# Patient Record
Sex: Male | Born: 1935 | Race: White | Hispanic: No | Smoking: Former smoker
Health system: Southern US, Community
[De-identification: ages and names within clinical notes are randomized; demographics above are authoritative.]

## PROBLEM LIST (undated history)

## (undated) DIAGNOSIS — E119 Type 2 diabetes mellitus without complications: Secondary | ICD-10-CM

## (undated) DIAGNOSIS — M1990 Unspecified osteoarthritis, unspecified site: Secondary | ICD-10-CM

## (undated) DIAGNOSIS — S72002A Fracture of unspecified part of neck of left femur, initial encounter for closed fracture: Secondary | ICD-10-CM

## (undated) DIAGNOSIS — I10 Essential (primary) hypertension: Secondary | ICD-10-CM

## (undated) DIAGNOSIS — C679 Malignant neoplasm of bladder, unspecified: Secondary | ICD-10-CM

## (undated) DIAGNOSIS — E538 Deficiency of other specified B group vitamins: Secondary | ICD-10-CM

## (undated) DIAGNOSIS — G4730 Sleep apnea, unspecified: Secondary | ICD-10-CM

## (undated) DIAGNOSIS — I499 Cardiac arrhythmia, unspecified: Secondary | ICD-10-CM

## (undated) HISTORY — DX: Cardiac arrhythmia, unspecified: I49.9

## (undated) HISTORY — DX: Essential (primary) hypertension: I10

## (undated) HISTORY — DX: Deficiency of other specified B group vitamins: E53.8

## (undated) HISTORY — DX: Unspecified osteoarthritis, unspecified site: M19.90

## (undated) HISTORY — DX: Sleep apnea, unspecified: G47.30

## (undated) HISTORY — DX: Type 2 diabetes mellitus without complications: E11.9

## (undated) HISTORY — PX: EAR CYST EXCISION: SHX22

---

## 1959-06-24 HISTORY — PX: OTHER SURGICAL HISTORY: SHX169

## 2007-04-26 ENCOUNTER — Ambulatory Visit: Payer: Self-pay | Admitting: Gastroenterology

## 2013-06-09 ENCOUNTER — Encounter: Payer: Self-pay | Admitting: Podiatry

## 2013-06-13 ENCOUNTER — Encounter: Payer: Self-pay | Admitting: Podiatry

## 2013-06-13 ENCOUNTER — Ambulatory Visit: Payer: Self-pay | Admitting: Podiatry

## 2013-06-13 ENCOUNTER — Ambulatory Visit (INDEPENDENT_AMBULATORY_CARE_PROVIDER_SITE_OTHER): Payer: Medicare Other | Admitting: Podiatry

## 2013-06-13 VITALS — BP 83/64 | HR 65 | Resp 16 | Ht 72.0 in | Wt 192.0 lb

## 2013-06-13 DIAGNOSIS — B351 Tinea unguium: Secondary | ICD-10-CM

## 2013-06-13 DIAGNOSIS — M79609 Pain in unspecified limb: Secondary | ICD-10-CM

## 2013-06-13 NOTE — Progress Notes (Signed)
Same presents today with a chief complaint of painful toenails one through 5 bilateral. He also has a reactive hyperkeratosis to the distal lateral aspect of the fourth digit right foot.  Objective: Pulses remain palpable bilateral. Mild flexible hammertoe deformities noted bilateral resulting in a reactive hyperkeratosis fourth digit right foot. Nails are thick yellow dystrophic onychomycotic.  Assessment: Painful distal clavus fourth digit right foot with pain in limb secondary to onychomycosis 1 through 5 bilateral.  Plan: Debridement of nails 1 through 5 bilateral covered service secondary to pain.

## 2013-08-01 ENCOUNTER — Ambulatory Visit: Payer: Self-pay | Admitting: Internal Medicine

## 2013-08-08 ENCOUNTER — Ambulatory Visit: Payer: Self-pay | Admitting: Internal Medicine

## 2013-08-23 ENCOUNTER — Ambulatory Visit: Payer: Self-pay | Admitting: Internal Medicine

## 2013-08-24 ENCOUNTER — Encounter: Payer: Self-pay | Admitting: Podiatry

## 2013-08-24 ENCOUNTER — Ambulatory Visit (INDEPENDENT_AMBULATORY_CARE_PROVIDER_SITE_OTHER): Payer: Medicare Other | Admitting: Podiatry

## 2013-08-24 VITALS — BP 147/63 | HR 75 | Resp 16 | Ht 72.0 in | Wt 189.0 lb

## 2013-08-24 DIAGNOSIS — L980 Pyogenic granuloma: Secondary | ICD-10-CM

## 2013-08-24 DIAGNOSIS — L02619 Cutaneous abscess of unspecified foot: Secondary | ICD-10-CM

## 2013-08-24 DIAGNOSIS — L851 Acquired keratosis [keratoderma] palmaris et plantaris: Secondary | ICD-10-CM

## 2013-08-24 DIAGNOSIS — L03119 Cellulitis of unspecified part of limb: Secondary | ICD-10-CM

## 2013-08-24 MED ORDER — AMOXICILLIN-POT CLAVULANATE 500-125 MG PO TABS: 1.0000 | ORAL_TABLET | Freq: Three times a day (TID) | ORAL | Status: DC

## 2013-08-24 NOTE — Progress Notes (Signed)
Mr. Amendola presents today with a chief complaint of a painful lesion to the medial aspect of the first metatarsophalangeal joint of the right foot he states is been here for about one month nail. He states it is red all this and bleeds easily. He states that her drains and bleeds and his sock. He denies any trauma to the area but does relate that it is painful.  Objective: Vital signs are stable he is alert and oriented x3. Pulses are strongly palpable to the right lower extremity. The medial aspect of the first metatarsophalangeal joint demonstrates a superficial ulceration with what appears to be a small granuloma less than a centimeter in diameter. This granuloma and region surrounding it is erythematous and painful on palpation.  Assessment: Soft tissue lesion probably pyogenic granuloma.  Plan: We numbed the area of today with local anesthetic and performed a shave biopsy in order to send for pathology. This was performed under sterile conditions and I will followup with him once the pathology returns.

## 2013-08-31 ENCOUNTER — Ambulatory Visit (INDEPENDENT_AMBULATORY_CARE_PROVIDER_SITE_OTHER): Payer: Medicare Other | Admitting: Podiatry

## 2013-08-31 ENCOUNTER — Encounter: Payer: Self-pay | Admitting: Podiatry

## 2013-08-31 VITALS — BP 135/66 | HR 79 | Temp 97.9°F | Resp 18

## 2013-08-31 DIAGNOSIS — L02619 Cutaneous abscess of unspecified foot: Secondary | ICD-10-CM

## 2013-08-31 DIAGNOSIS — L03119 Cellulitis of unspecified part of limb: Principal | ICD-10-CM

## 2013-08-31 MED ORDER — CLINDAMYCIN HCL 150 MG PO CAPS: 150.0000 mg | ORAL_CAPSULE | Freq: Three times a day (TID) | ORAL | Status: DC

## 2013-08-31 NOTE — Progress Notes (Signed)
Same presents today with his son Herbert Phillips for followup of his shave biopsy medial aspect first metatarsophalangeal joint. He states that he neglected to pick up his antibiotic until Monday of this week. Stating that I think about this been get out of control. He also states he has been soaking in Epsom salts and water. He denies fever chills nausea vomiting muscle aches or pains. He goes on to say that his blood sugar has been better than normal.  Objective: Pulses are palpable right foot he presents today with a well-healed excision site of a pyogenic granuloma from biopsy. However the first metatarsophalangeal joint is ulcerated medially with severe erythema and cellulitis. The area of ulceration does have some purulence. I numbed the area today with local anesthetic prepped it with Betadine and incised and drained a small area. Samples were taken and sent for culture and sensitivity.  Assessment cellulitis and abscess ulceration with diabetes mellitus right foot.  Plan: Continue antibiotics, Augmentin. I also added clindamycin 150 mg 3 times a day. He will start soaking in Epsom salts and water tomorrow and redressed with a dry sterile compressive dressing. He and his son we'll watch for signs and symptoms of infection and worsening cellulitis. We went over these thoroughly today. He will followup with the ER should any of these develop. Otherwise I will followup with him in one to 2 weeks. Dr. Felisa Bonier will see him this Friday.

## 2013-09-01 ENCOUNTER — Encounter: Payer: Self-pay | Admitting: Podiatry

## 2013-09-02 ENCOUNTER — Ambulatory Visit (INDEPENDENT_AMBULATORY_CARE_PROVIDER_SITE_OTHER): Payer: Medicare Other | Admitting: Podiatry

## 2013-09-02 VITALS — BP 126/63 | HR 72 | Resp 16 | Ht 72.0 in | Wt 183.0 lb

## 2013-09-02 DIAGNOSIS — L02619 Cutaneous abscess of unspecified foot: Secondary | ICD-10-CM

## 2013-09-02 DIAGNOSIS — L03119 Cellulitis of unspecified part of limb: Principal | ICD-10-CM

## 2013-09-02 NOTE — Progress Notes (Signed)
Subjective:     Patient ID: Herbert Phillips, male   DOB: [birth year], 80 y.o.   MRN: [id number]  HPI patient presents with son stating that it seems some better than the previous time I was here and I'm not having any pain with that and I have not noted any increase in redness   Review of Systems     Objective:   Physical Exam Neurovascular status unchanged with a small opening on the medial side of the right first metatarsal measuring approximate 5 x 5 mm that is localized with no subcutaneous exposure and a small area of erythema surrounding at that is localized with no proximal edema erythema lymph node distention or indications of systemic infection    Assessment:     Localized healing area with mild cellulitis surrounding    Plan:     Continue with the to antibiotics and reviewed the results of initial pathology indicating staph infection. He should be well covered for this with a to antibiotics he is on and he is to see Dr. Milinda Pointer back in 10 days or earlier if any issues should occur and he is encouraged to go to the emergency room if any streaking or redness were to occur in a more proximal direction

## 2013-09-05 ENCOUNTER — Encounter: Payer: Self-pay | Admitting: Podiatry

## 2013-09-05 ENCOUNTER — Ambulatory Visit (INDEPENDENT_AMBULATORY_CARE_PROVIDER_SITE_OTHER): Payer: Medicare Other | Admitting: Podiatry

## 2013-09-05 VITALS — BP 131/67 | HR 69 | Resp 16 | Ht 72.0 in | Wt 189.0 lb

## 2013-09-05 DIAGNOSIS — L02619 Cutaneous abscess of unspecified foot: Secondary | ICD-10-CM

## 2013-09-05 DIAGNOSIS — L03119 Cellulitis of unspecified part of limb: Principal | ICD-10-CM

## 2013-09-05 MED ORDER — AMOXICILLIN-POT CLAVULANATE 500-125 MG PO TABS: 1.0000 | ORAL_TABLET | Freq: Three times a day (TID) | ORAL | Status: DC

## 2013-09-05 MED ORDER — CLINDAMYCIN HCL 150 MG PO CAPS: 150.0000 mg | ORAL_CAPSULE | Freq: Three times a day (TID) | ORAL | Status: DC

## 2013-09-05 NOTE — Progress Notes (Signed)
He presents today for followup of his ulceration and cellulitis to his right foot. Much decrease in erythema and edema to the right foot. Appears to be healing quite nicely.  Objective: Vital signs are stable he is alert and oriented x3. Much decrease in edema and erythema of ulceration appears to be healing. Has no pain on range of motion of the first metatarsophalangeal joint and no. Once his extubated.  Assessment: Well-healing abscess first metatarsophalangeal joint right foot.  Plan: Continue all conservative therapies and continue antibiotics which were reorder today. I will followup with him in 2 weeks

## 2013-09-12 ENCOUNTER — Encounter: Payer: Self-pay | Admitting: Podiatry

## 2013-09-12 ENCOUNTER — Ambulatory Visit (INDEPENDENT_AMBULATORY_CARE_PROVIDER_SITE_OTHER): Payer: Medicare Other | Admitting: Podiatry

## 2013-09-12 VITALS — BP 137/63 | HR 75 | Resp 18

## 2013-09-12 DIAGNOSIS — B351 Tinea unguium: Secondary | ICD-10-CM

## 2013-09-12 DIAGNOSIS — L02619 Cutaneous abscess of unspecified foot: Secondary | ICD-10-CM

## 2013-09-12 DIAGNOSIS — L03119 Cellulitis of unspecified part of limb: Secondary | ICD-10-CM

## 2013-09-12 DIAGNOSIS — M79609 Pain in unspecified limb: Secondary | ICD-10-CM

## 2013-09-12 NOTE — Progress Notes (Signed)
Check this place on my foot and trim my toenails. He continues to take all medications as prescribed. He continues to soak foot. He is questioning whether or not he should play golf. And he does present with a regular shoe on today.  Objective: Vital signs are stable he is alert and oriented x3. Pulses are palpable right foot. Erythema and edema to the right foot is much decrease since last visit. He continues to remain on his antibiotics and continues to soak daily. Range of motion of the joint is nontender his. His nails are thick yellow dystrophic onychomycotic and painful palpation.  Assessment: Well-healing abscess right first metatarsophalangeal joint. No longer an open lesion. Cellulitis is still present. Pain in limb secondary to onychomycosis 1 through 5 bilateral.  Plan: Continue all conservative therapies including antibiotics for soaking dressing wearing his Darco shoe. Debridement nails 1 through 5 bilateral is cover service today followup with him in 3 months for his toenails in 2 weeks for the abscess.

## 2013-09-14 DIAGNOSIS — E119 Type 2 diabetes mellitus without complications: Secondary | ICD-10-CM | POA: Insufficient documentation

## 2013-09-14 DIAGNOSIS — G4730 Sleep apnea, unspecified: Secondary | ICD-10-CM | POA: Insufficient documentation

## 2013-09-14 DIAGNOSIS — E1169 Type 2 diabetes mellitus with other specified complication: Secondary | ICD-10-CM | POA: Insufficient documentation

## 2013-09-14 DIAGNOSIS — I10 Essential (primary) hypertension: Secondary | ICD-10-CM | POA: Insufficient documentation

## 2013-09-14 DIAGNOSIS — E785 Hyperlipidemia, unspecified: Secondary | ICD-10-CM | POA: Insufficient documentation

## 2013-09-14 HISTORY — DX: Type 2 diabetes mellitus without complications: E11.9

## 2013-09-28 ENCOUNTER — Ambulatory Visit (INDEPENDENT_AMBULATORY_CARE_PROVIDER_SITE_OTHER): Payer: Medicare Other | Admitting: Podiatry

## 2013-09-28 ENCOUNTER — Encounter: Payer: Self-pay | Admitting: Podiatry

## 2013-09-28 VITALS — BP 141/68 | HR 63 | Resp 16

## 2013-09-28 DIAGNOSIS — L03119 Cellulitis of unspecified part of limb: Principal | ICD-10-CM

## 2013-09-28 DIAGNOSIS — L02619 Cutaneous abscess of unspecified foot: Secondary | ICD-10-CM

## 2013-09-28 NOTE — Progress Notes (Signed)
Herbert Phillips presents today for followup of his cellulitis right foot. I think it's completely healed he says.  Objective: Vital signs are stable he is alert and oriented x3. There is no erythema edema cellulitis drainage or odor appears to be completely normal.  Assessment: Well-healing's cellulitic foot right.  Plan: Continue Epsom salt soaks on a daily basis followup with me in a month for his regular scheduled nail debridement

## 2013-12-12 ENCOUNTER — Ambulatory Visit (INDEPENDENT_AMBULATORY_CARE_PROVIDER_SITE_OTHER): Payer: Medicare Other | Admitting: Podiatry

## 2013-12-12 VITALS — BP 112/63 | HR 82 | Resp 16

## 2013-12-12 DIAGNOSIS — M79609 Pain in unspecified limb: Secondary | ICD-10-CM

## 2013-12-12 DIAGNOSIS — M79673 Pain in unspecified foot: Secondary | ICD-10-CM

## 2013-12-12 DIAGNOSIS — B351 Tinea unguium: Secondary | ICD-10-CM

## 2013-12-12 NOTE — Progress Notes (Signed)
He presents today with a chief complaint of painful elongated toenails. History of falling and weakness.  Objective: Pulses is alert and oriented x3. Pulses are palpable bilateral. Nails are thick yellow dystrophic with mycotic and painful palpation.  Assessment: Pain in limb secondary to onychomycosis 1 through 5 bilateral.  Plan: Debridement of nails 1 through 5 bilateral covered service secondary to pain.

## 2014-02-20 DIAGNOSIS — R109 Unspecified abdominal pain: Secondary | ICD-10-CM | POA: Insufficient documentation

## 2014-02-28 ENCOUNTER — Encounter: Payer: Self-pay | Admitting: Podiatry

## 2014-02-28 ENCOUNTER — Ambulatory Visit (INDEPENDENT_AMBULATORY_CARE_PROVIDER_SITE_OTHER): Payer: Medicare Other | Admitting: Podiatry

## 2014-02-28 VITALS — BP 75/58 | HR 61 | Resp 16

## 2014-02-28 DIAGNOSIS — M79673 Pain in unspecified foot: Secondary | ICD-10-CM

## 2014-02-28 DIAGNOSIS — M79609 Pain in unspecified limb: Secondary | ICD-10-CM

## 2014-02-28 DIAGNOSIS — B351 Tinea unguium: Secondary | ICD-10-CM

## 2014-02-28 DIAGNOSIS — Q828 Other specified congenital malformations of skin: Secondary | ICD-10-CM

## 2014-02-28 NOTE — Patient Instructions (Signed)
Diabetes and Foot Care Diabetes may cause you to have problems because of poor blood supply (circulation) to your feet and legs. This may cause the skin on your feet to become thinner, break easier, and heal more slowly. Your skin may become dry, and the skin may peel and crack. You may also have nerve damage in your legs and feet causing decreased feeling in them. You may not notice minor injuries to your feet that could lead to infections or more serious problems. Taking care of your feet is one of the most important things you can do for yourself.  HOME CARE INSTRUCTIONS  Wear shoes at all times, even in the house. Do not go barefoot. Bare feet are easily injured.  Check your feet daily for blisters, cuts, and redness. If you cannot see the bottom of your feet, use a mirror or ask someone for help.  Wash your feet with warm water (do not use hot water) and mild soap. Then pat your feet and the areas between your toes until they are completely dry. Do not soak your feet as this can dry your skin.  Apply a moisturizing lotion or petroleum jelly (that does not contain alcohol and is unscented) to the skin on your feet and to dry, brittle toenails. Do not apply lotion between your toes.  Trim your toenails straight across. Do not dig under them or around the cuticle. File the edges of your nails with an emery board or nail file.  Do not cut corns or calluses or try to remove them with medicine.  Wear clean socks or stockings every day. Make sure they are not too tight. Do not wear knee-high stockings since they may decrease blood flow to your legs.  Wear shoes that fit properly and have enough cushioning. To break in new shoes, wear them for just a few hours a day. This prevents you from injuring your feet. Always look in your shoes before you put them on to be sure there are no objects inside.  Do not cross your legs. This may decrease the blood flow to your feet.  If you find a minor scrape,  cut, or break in the skin on your feet, keep it and the skin around it clean and dry. These areas may be cleansed with mild soap and water. Do not cleanse the area with peroxide, alcohol, or iodine.  When you remove an adhesive bandage, be sure not to damage the skin around it.  If you have a wound, look at it several times a day to make sure it is healing.  Do not use heating pads or hot water bottles. They may burn your skin. If you have lost feeling in your feet or legs, you may not know it is happening until it is too late.  Make sure your health care provider performs a complete foot exam at least annually or more often if you have foot problems. Report any cuts, sores, or bruises to your health care provider immediately. SEEK MEDICAL CARE IF:   You have an injury that is not healing.  You have cuts or breaks in the skin.  You have an ingrown nail.  You notice redness on your legs or feet.  You feel burning or tingling in your legs or feet.  You have pain or cramps in your legs and feet.  Your legs or feet are numb.  Your feet always feel cold. SEEK IMMEDIATE MEDICAL CARE IF:   There is increasing redness,   swelling, or pain in or around a wound.  There is a red line that goes up your leg.  Pus is coming from a wound.  You develop a fever or as directed by your health care provider.  You notice a bad smell coming from an ulcer or wound. Document Released: 06/06/2000 Document Revised: 02/09/2013 Document Reviewed: 11/16/2012 Exit[employer] Patient Information 2015 [employer]. This information is not intended to replace advice given to you by your health care provider. Make sure you discuss any questions you have with your health care provider.  

## 2014-02-28 NOTE — Progress Notes (Signed)
Patient ID: Albin Duckett., male   DOB: 27-May-1926, 80 y.o.   MRN: [id number]  Subjective:  80 year old male presents to the office today with complaints of a painful lesion on his right 4th digit, as well as painful elongated nails. Nails are symptomatic mostly with shoegear. States he is playing golf tomorrow and the lesion on the right 4th toe has become increasingly painful. He states his blood sugar runs between 90-120 and was 109 this morning. Denies any claudication symptoms. States he has neuropathy. No other complaints.   Objective: AAO x3, NAD DP/PT pulses palpable b/l. CRT < 3sec Protective sensation decreased. Nails elongated, hypertrophic, dystrophic, yellow discoloration x10. No surrounding erythema, drainage. Thick hyperkerotic lesion plantar lateral 4th digit with associated adductovarus deformity.  MMT 5/5, ROM WNL No leg pain/warmth/edema  Assessment: 80 year old male with symptomatic onychomycosis, painful hyperkerotic lesion right 4th digit.   Plan: -Various treatment options discussed with the patient including all alternatives, risks, complications. -Nails sharply debrided x 10 without complications. -Hyperkerotic lesion sharply debrided x1 without complication.  -Dispensed silicone gel offloading pad -Daily foot inspection.   -F/U in 3 months or sooner if any problems are to occur or any change in symptoms. Call with any questions/concerns.

## 2014-03-13 ENCOUNTER — Ambulatory Visit (INDEPENDENT_AMBULATORY_CARE_PROVIDER_SITE_OTHER): Payer: Medicare Other | Admitting: Podiatry

## 2014-03-13 ENCOUNTER — Ambulatory Visit: Payer: Medicare Other | Admitting: Podiatry

## 2014-03-13 DIAGNOSIS — M79609 Pain in unspecified limb: Secondary | ICD-10-CM

## 2014-03-13 DIAGNOSIS — M79673 Pain in unspecified foot: Secondary | ICD-10-CM

## 2014-03-13 DIAGNOSIS — B351 Tinea unguium: Secondary | ICD-10-CM

## 2014-03-13 NOTE — Progress Notes (Signed)
He presents today with a chief complaint of painful elongated toenails.  Objective: Nails are thick yellow dystrophic with mycotic and painful palpation.  Assessment: Pain in limb secondary to onychomycosis 1 through 5 bilateral.  Plan: Debridement of nails 1 through 5 bilateral covered service secondary to pain. 

## 2014-03-29 ENCOUNTER — Ambulatory Visit: Payer: Medicare Other | Admitting: Podiatry

## 2014-05-29 ENCOUNTER — Ambulatory Visit: Payer: Medicare Other | Admitting: Podiatry

## 2014-06-01 ENCOUNTER — Ambulatory Visit: Payer: Self-pay | Admitting: Otolaryngology

## 2014-06-13 ENCOUNTER — Ambulatory Visit: Payer: Self-pay | Admitting: Otolaryngology

## 2014-07-05 ENCOUNTER — Encounter: Payer: Self-pay | Admitting: Neurology

## 2014-07-10 ENCOUNTER — Ambulatory Visit: Payer: Medicare Other | Admitting: Podiatry

## 2014-07-10 ENCOUNTER — Ambulatory Visit (INDEPENDENT_AMBULATORY_CARE_PROVIDER_SITE_OTHER): Payer: Medicare Other | Admitting: Podiatry

## 2014-07-10 DIAGNOSIS — M79673 Pain in unspecified foot: Secondary | ICD-10-CM | POA: Diagnosis not present

## 2014-07-10 DIAGNOSIS — B351 Tinea unguium: Secondary | ICD-10-CM | POA: Diagnosis not present

## 2014-07-10 NOTE — Progress Notes (Signed)
He presents today with a chief complaint of painful elongated toenails.  Objective: Nails are thick yellow dystrophic with mycotic and painful palpation.  Assessment: Pain in limb secondary to onychomycosis 1 through 5 bilateral.  Plan: Debridement of nails 1 through 5 bilateral covered service secondary to pain.

## 2014-07-24 ENCOUNTER — Encounter: Payer: Self-pay | Admitting: Neurology

## 2014-08-22 ENCOUNTER — Encounter: Payer: Self-pay | Admitting: Neurology

## 2014-09-28 ENCOUNTER — Ambulatory Visit
Admit: 2014-09-28 | Disposition: A | Discharge status: Home / Self Care | Payer: Self-pay | Attending: Internal Medicine | Admitting: Internal Medicine

## 2014-10-05 ENCOUNTER — Observation Stay
Admit: 2014-10-05 | Disposition: A | Discharge status: Home / Self Care | Payer: Self-pay | Attending: Internal Medicine | Admitting: Internal Medicine

## 2014-10-05 LAB — TROPONIN I: Troponin-I: <0.03 ng/mL

## 2014-10-05 LAB — CBC
HCT: 46.8 % (ref 40.0–52.0)
HGB: 15.3 g/dL (ref 13.0–18.0)
MCH: 30.6 pg (ref 26.0–34.0)
MCHC: 32.7 g/dL (ref 32.0–36.0)
MCV: 94 fL (ref 80–100)
Platelet: 227 10*3/uL (ref 150–440)
RBC: 5 10*6/uL (ref 4.40–5.90)
RDW: 13.6 % (ref 11.5–14.5)
WBC: 11 10*3/uL — AB (ref 3.8–10.6)

## 2014-10-05 LAB — COMPREHENSIVE METABOLIC PANEL
ALK PHOS: 51 U/L
ALT: 13 U/L — AB
Albumin: 3.7 g/dL
Anion Gap: 9 (ref 7–16)
BILIRUBIN TOTAL: 0.7 mg/dL
BUN: 27 mg/dL — ABNORMAL HIGH
CALCIUM: 8.8 mg/dL — AB
CHLORIDE: 105 mmol/L
CO2: 25 mmol/L
Creatinine: 1.09 mg/dL
EGFR (African American): >60
Glucose: 72 mg/dL
POTASSIUM: 3.9 mmol/L
SGOT(AST): 19 U/L
SODIUM: 139 mmol/L
Total Protein: 7.7 g/dL

## 2014-10-05 LAB — URINALYSIS, COMPLETE
Bacteria: NONE SEEN
Bilirubin,UR: NEGATIVE
Glucose,UR: NEGATIVE mg/dL (ref 0–75)
Ketone: NEGATIVE
LEUKOCYTE ESTERASE: NEGATIVE
Nitrite: NEGATIVE
Ph: 5 (ref 4.5–8.0)
Protein: NEGATIVE
Specific Gravity: 1.011 (ref 1.003–1.030)
Squamous Epithelial: NONE SEEN

## 2014-10-06 LAB — BASIC METABOLIC PANEL
Anion Gap: 4 — ABNORMAL LOW (ref 7–16)
BUN: 25 mg/dL — ABNORMAL HIGH
CALCIUM: 8.4 mg/dL — AB
CO2: 28 mmol/L
Chloride: 109 mmol/L
Creatinine: 0.95 mg/dL
EGFR (African American): >60
Glucose: 147 mg/dL — ABNORMAL HIGH
POTASSIUM: 3.6 mmol/L
Sodium: 141 mmol/L

## 2014-10-06 LAB — MAGNESIUM: MAGNESIUM: 2.1 mg/dL

## 2014-10-09 ENCOUNTER — Encounter: Payer: Self-pay | Admitting: Podiatry

## 2014-10-09 ENCOUNTER — Ambulatory Visit (INDEPENDENT_AMBULATORY_CARE_PROVIDER_SITE_OTHER): Payer: Medicare Other | Admitting: Podiatry

## 2014-10-09 DIAGNOSIS — M79673 Pain in unspecified foot: Secondary | ICD-10-CM

## 2014-10-09 DIAGNOSIS — B351 Tinea unguium: Secondary | ICD-10-CM | POA: Diagnosis not present

## 2014-10-09 NOTE — Patient Instructions (Signed)
Diabetes and Foot Care Diabetes may cause you to have problems because of poor blood supply (circulation) to your feet and legs. This may cause the skin on your feet to become thinner, break easier, and heal more slowly. Your skin may become dry, and the skin may peel and crack. You may also have nerve damage in your legs and feet causing decreased feeling in them. You may not notice minor injuries to your feet that could lead to infections or more serious problems. Taking care of your feet is one of the most important things you can do for yourself.  HOME CARE INSTRUCTIONS  Wear shoes at all times, even in the house. Do not go barefoot. Bare feet are easily injured.  Check your feet daily for blisters, cuts, and redness. If you cannot see the bottom of your feet, use a mirror or ask someone for help.  Wash your feet with warm water (do not use hot water) and mild soap. Then pat your feet and the areas between your toes until they are completely dry. Do not soak your feet as this can dry your skin.  Apply a moisturizing lotion or petroleum jelly (that does not contain alcohol and is unscented) to the skin on your feet and to dry, brittle toenails. Do not apply lotion between your toes.  Trim your toenails straight across. Do not dig under them or around the cuticle. File the edges of your nails with an emery board or nail file.  Do not cut corns or calluses or try to remove them with medicine.  Wear clean socks or stockings every day. Make sure they are not too tight. Do not wear knee-high stockings since they may decrease blood flow to your legs.  Wear shoes that fit properly and have enough cushioning. To break in new shoes, wear them for just a few hours a day. This prevents you from injuring your feet. Always look in your shoes before you put them on to be sure there are no objects inside.  Do not cross your legs. This may decrease the blood flow to your feet.  If you find a minor scrape,  cut, or break in the skin on your feet, keep it and the skin around it clean and dry. These areas may be cleansed with mild soap and water. Do not cleanse the area with peroxide, alcohol, or iodine.  When you remove an adhesive bandage, be sure not to damage the skin around it.  If you have a wound, look at it several times a day to make sure it is healing.  Do not use heating pads or hot water bottles. They may burn your skin. If you have lost feeling in your feet or legs, you may not know it is happening until it is too late.  Make sure your health care provider performs a complete foot exam at least annually or more often if you have foot problems. Report any cuts, sores, or bruises to your health care provider immediately. SEEK MEDICAL CARE IF:   You have an injury that is not healing.  You have cuts or breaks in the skin.  You have an ingrown nail.  You notice redness on your legs or feet.  You feel burning or tingling in your legs or feet.  You have pain or cramps in your legs and feet.  Your legs or feet are numb.  Your feet always feel cold. SEEK IMMEDIATE MEDICAL CARE IF:   There is increasing redness,   swelling, or pain in or around a wound.  There is a red line that goes up your leg.  Pus is coming from a wound.  You develop a fever or as directed by your health care provider.  You notice a bad smell coming from an ulcer or wound. Document Released: 06/06/2000 Document Revised: 02/09/2013 Document Reviewed: 11/16/2012 Exit[employer] Patient Information 2015 [employer]. This information is not intended to replace advice given to you by your health care provider. Make sure you discuss any questions you have with your health care provider.  

## 2014-10-09 NOTE — Progress Notes (Signed)
He presents today with a chief complaint of painful elongated toenails.  Objective: Nails are thick yellow dystrophic with mycotic and painful palpation.  Assessment: Pain in limb secondary to onychomycosis 1 through 5 bilateral.  Plan: Debridement of nails 1 through 5 bilateral covered service secondary to pain.

## 2014-10-18 ENCOUNTER — Other Ambulatory Visit: Payer: Self-pay

## 2014-10-18 ENCOUNTER — Ambulatory Visit
Admit: 2014-10-18 | Disposition: A | Discharge status: Home / Self Care | Payer: Self-pay | Attending: Urology | Admitting: Urology

## 2014-10-18 ENCOUNTER — Ambulatory Visit: Payer: Self-pay

## 2014-10-22 NOTE — Discharge Summary (Signed)
PATIENT NAME:  Herbert Phillips, Herbert Phillips MR#:  [id number] DATE OF BIRTH:  [birth year]  DATE OF ADMISSION:  10/05/2014 DATE OF DISCHARGE:  10/06/2014  For detailed note, please see the history and physical done on admission by Dr. Bridgett Larsson.   DIAGNOSES AT DISCHARGE:  Weakness and dizziness, likely secondary symptomatic bradycardia, symptomatic bradycardia.  Diabetes, hypertension, and hyperlipidemia.   DISCHARGE INSTRUCTIONS:  The patient is being discharged on a low-sodium, low-fat, carbohydrate-controlled diet.  Activity is as tolerated.  Followup with Dr. Sharlet Salina C. Hall Busing next week.  Also, followup with Dr. Jordan Hawks next week.    DISCHARGE MEDICATIONS:  Vitamin B12 1000 mcg daily, chondroitin glucosamine supplements daily, metformin 5 mg b.i.d., glyburide 5 mg daily, lovastatin 40 mg daily and ramipril 2.5 mg daily.   CONSULTANTS DURING THE HOSPITAL STAY:  Dr. Clayborn Bigness from cardiology.   LABORATORY AND IMAGING STUDIES DURING HOSPITAL COURSE:  A CT scan of the head done without contrast showing no acute intracranial abnormality.  A chest x-ray done on admission showing no acute cardiopulmonary disease.   BRIEF HOSPITAL COURSE:  This is an 81 year old male with medical problems as mentioned above who presented to the hospital with weakness and dizziness and noted to have an abnormal EKG with bigeminy and bradycardia.   IMPRESSION:  1.  Weakness and dizziness.  The most likely cause of this was symptomatic bradycardia.  There was some suspicion on admission that this could probably be a stroke, although the patient had a CT head on admission which was negative.  The patient was seen by cardiology.  They recommended discontinuing his atenolol and to have a loop/event monitor done as an outpatient and followup with Dr. Ubaldo Glassing, his cardiologist, as an outpatient.  The patient was ambulated prior to discharge.  He did not have any evidence of weakness, dizziness, or orthostasis and therefore  is being discharged  home. 2.  Symptomatic bradycardia.  This was likely the cause of patient's dizziness and weakness.  This has improved.  The patient is currently off his atenolol.  He may have questionable sick sinus syndrome but would likely benefit from a loop/event monitor which is to be arranged through his cardiologist as an outpatient.  The plan was discussed with the patient's cardiologist, Dr. Clayborn Bigness who agreed with this.  He can have an outpatient echocardiogram.  He was ambulated and was asymptomatic prior to discharge.  3.  Diabetes.  The patient in the hospital was maintained on sliding scale insulin but currently being discharged on oral metformin.  4.  Hyperlipidemia.  The patient was maintained on his lovastatin, having resumed that upon discharge.   CODE STATUS:  The patient is a full code.   TIME SPENT ON DISCHARGE:  35 minutes    ____________________________ Belia Heman. Verdell Carmine, MD [id number] D: 10/06/2014 15:48:41 ET T: 10/06/2014 17:26:30 ET JOB#: [id number]  cc: Belia Heman. Verdell Carmine, MD, <Dictator> Leona Carry. Hall Busing, MD Javier Docker Ubaldo Glassing, MD Henreitta Leber MD ELECTRONICALLY SIGNED 10/11/2014 11:31

## 2014-10-22 NOTE — Consult Note (Signed)
PATIENT NAME:  Herbert Phillips, Herbert Phillips MR#:  [id number] DATE OF BIRTH:  04-14-1926  DATE OF CONSULTATION:  10/06/2014  REFERRING PHYSICIAN:      Demetrios Loll, MD CONSULTING PHYSICIAN:  Chamberlain Steinborn D. Clayborn Bigness, MD  PRIMARY CARE PHYSICIAN:  Leona Carry. Hall Busing, MD  INDICATION: Weakness, vertigo, dizziness.   HISTORY OF PRESENT ILLNESS:  The patient is an 81 year old white male with history of diabetes, TIAs in the past, who presented to the Emergency Room with weakness, fatigue, lightheadedness. The patient was awake and alert, in no distress, but was in the store and he was found to have hematuria, thought to might have bladder cancer. He was scheduled to have evaluation by Dr. Yves Dill in the morning. The patient's aspirin was stopped recently for the procedure. The patient started to have weakness. In addition, the patient had dizziness while standing. He denies any headache, fever, chills, or sweats. No chest pain, no orthopnea, PND. EKG showed bigeminy, atypical rhythms. The patient subsequently was advised to be admitted for further evaluation.   PAST MEDICAL HISTORY: Hypertension, diabetes, TIA.    PAST SURGICAL HISTORY: None.   SOCIAL HISTORY: Negative. No smoking, no alcohol consumption.    FAMILY HISTORY: Hypertension, diabetes, heart attack, stroke.   ALLERGIES: None.   MEDICATIONS: Vitamin B12 1000 mcg daily, ramipril 2.5 daily, metformin 500 twice a day, lovastatin 40 at bedtime, glyburide 5 daily, glucosamine 1 tablet daily, atenolol 25 a day.     REVIEW OF SYSTEMS: Denies blackout spells or syncope. No nausea or vomiting. No fever, no chills, no sweats. No weight loss. No weight gain. No hemoptysis or hematemesis. No bright red blood per rectum. The patient had significant weakness, fatigue, pleural effusion, vertigo, dizziness. Neurologic was intact.  Skin exam normal.    PHYSICAL EXAMINATION:   VITAL SIGNS:  Blood pressure 150/60, pulse of 60, respiratory rate 18.  HEENT: Normocephalic,  atraumatic. Pupils equal and reactive to light.  NECK: Supple.  LUNGS: Clear.  HEART:  Regular rate and rhythm.    ABDOMEN: Benign.  EXTREMITIES: Within normal limits.  NEUROLOGIC: Intact.  SKIN: Normal.   DIAGNOSTIC DATA: Chest x-ray normal. CT of the head unremarkable with mild atrophy.  UA was negative. Glucose 72, BUN 27, creatinine 1.09. Electrolytes are normal. White count of 11, H and H of 15, platelet count of 227,000. Troponin less than 0.03.   EKG: Normal sinus rhythm, nonspecific ST-T changes, rate of 65, occasional bigeminy.   ASSESSMENT:   1. Bigeminy.   2. Dehydration and orthostatic hypotension.  3. History of hypertension.  4. Vertigo.  5. Dizziness.   PLAN:  1.  Agree with admit observation to telemetry. Would probably hold atenolol because of bradycardia. Continue treatment for orthostatic hypertension including rehydration.  2.  For hypertension, we will discontinue atenolol and use ramipril instead. We will continue to follow for orthostatic changes.  3.  Hematuria. Would hold aspirin, have the patient follow up with urology.  4.  Do not recommend any further evaluation and treatment.  5.  Would consider a permanent pacemaker if bradycardia continued and symptoms persist, in the meantime would recommend conservative medical therapy for vertigo.      ____________________________ Loran Senters Clayborn Bigness, MD [id number] D: 10/06/2014 15:26:42 ET T: 10/06/2014 17:03:26 ET JOB#: [id number]  cc: Ahja Martello D. Clayborn Bigness, MD, <Dictator> Yolonda Kida MD ELECTRONICALLY SIGNED 10/07/2014 9:32

## 2014-10-22 NOTE — H&P (Signed)
PATIENT NAME:  Herbert Phillips, Herbert Phillips MR#:  [id number] DATE OF BIRTH:  1926-03-30  DATE OF ADMISSION:  10/05/2014  PRIMARY CARE PHYSICIAN: Sharlet Salina C. Hall Busing, MD  REFERRING PHYSICIAN: Sheryl L. Benjaman Lobe, MD  CHIEF COMPLAINT: Weakness today.   HISTORY OF PRESENT ILLNESS: An 81 year old Caucasian male with a history of diabetes, and TIA presented to the ED with the above chief complaint. The patient is alert, awake, oriented, in no acute distress. The patient recently had hematuria, was suspected to have a bladder tumor. The patient was scheduled to have a procedure by Dr. Yves Dill tomorrow. The patient's aspirin was stopped recently for the procedure. The patient started to have weakness since this morning. In addition, the patient has dizziness while standing, but the patient denies any headache, fever, or chills. No chest pain, palpitation. No orthopnea, nocturnal dyspnea, or leg edema. The patient EKG in the ED showed bigeminy, arrhythmia. Dr. Benjaman Lobe suggested admit the patient for cardiology clearance for procedure.   PAST MEDICAL HISTORY: Hypertension, diabetes, TIA.   PAST SURGICAL HISTORY: None.  SOCIAL HISTORY: No smoking, alcohol drinking, or illicit drugs.   FAMILY HISTORY: Hypertension, diabetes, heart attack, stroke.   ALLERGIES: None.  HOME MEDICATIONS: 1.  Vitamin B12, 1000 mcg p.o. daily.  2.  Ramipril 2.5 mg 1 capsule once a day.  3.  Metformin 500 mg p.o. b.i.d. 4.  Lovastatin 40 mg p.o. at bedtime.  5.  Glyburide 5 mg p.o. daily.  6.  Chondroitin glucosamine 1 tablet p.o. daily.  7.  Atenolol 25 mg p.o. daily.  REVIEW OF SYSTEMS:  CONSTITUTIONAL: The patient denies any fever or chills. No headache, but has dizziness and weakness.  EYES: No double vision or blurred vision.  EAR, NOSE, THROAT: No postnasal drip, slurred speech, or dysphagia.  CARDIOVASCULAR: No chest pain, palpitation, orthopnea, or nocturnal dyspnea. No leg edema.  PULMONARY: No cough, sputum, shortness of  breath, or hemoptysis. GASTROINTESTINAL: No abdominal pain, nausea, vomiting, or diarrhea. No melena or bloody stool.  GENITOURINARY: No dysuria, hematuria, or incontinence.  SKIN: No rash or jaundice.  NEUROLOGY: No syncope, loss of consciousness, or seizure.  ENDOCRINOLOGY: No polyuria, polydipsia, heat or cold intolerance.   PHYSICAL EXAMINATION:  VITAL SIGNS: Temperature 97.7, blood pressure 152/62, pulse 64, oxygen saturation 97% on room air.  GENERAL: The patient is alert, awake, oriented, in no acute distress.  HEENT: Pupils round, equal and reactive to light and accommodation.  Moist oral mucosa. Clear pharynx.  NECK: Supple. No JVD or carotid bruit. No lymphadenopathy. No thyromegaly.  CARDIOVASCULAR: S1 and S2. Regular rate and rhythm. No murmurs or gallops.  PULMONARY: Bilateral air entry. No wheezing or rales. No use of accessory muscles to breathe.  ABDOMEN: Soft. No distention or tenderness. No organomegaly. Bowel sounds present. Obese.  EXTREMITIES: No edema, clubbing or cyanosis. No calf tenderness. Bilateral pedal pulses present.  SKIN: No rash or jaundice.  NEUROLOGIC: A and O x 3. No focal deficit. Power 5/5. Sensation intact.   LABORATORY DATA: Chest x-ray: No acute cardiopulmonary disease. CAT scan of head: Mild diffuse cortical atrophy. Mild chronic ischemic white matter disease. No acute intracranial abnormality. Urinalysis is negative. Glucose 72, BUN 27, creatinine 1.09, electrolytes normal. WBC 11.1, hemoglobin 15.3, platelets 227,000. Troponin less than 0.03. EKG shows sinus rhythm with frequent PVCs in a pattern of bigeminy at 65 BPM.   IMPRESSION:  1.  Arrhythmia with bigeminy changes. 2.  Dehydration.  3.  Orthostatic hypotension.  4.  Hypertension.  5.  Diabetes.  PLAN OF TREATMENT:  1.  The patient will be placed for observation. We will continue telemetry monitor. Get a cardiology consult from Dr. Ubaldo Glassing.  2.  For dehydration, I will start a normal saline  IV, follow BMP and magnesium level.  3.  Orthostatic hypotension. According to nurse, the patient has orthostatic blood pressure. We will give IV fluid support.  4.  For hypertension, continue ACE inhibitor, but hold atenolol and adjust the hypertension medication depending on the patient's blood pressure.  5.  For hematuria, the patient needs to reschedule appointment with Dr. Yves Dill and may follow up with Dr. Yves Dill as outpatient after discharge.  I discussed the patient's condition and plan of treatment with the patient, the patient's wife, and the son.   CODE STATUS: The patient wants full code.   TIME SPENT: About 52 minutes.    ____________________________ Demetrios Loll, MD qc:ST D: 10/05/2014 21:26:32 ET T: 10/05/2014 21:46:32 ET JOB#: [id number]  cc: Demetrios Loll, MD, <Dictator> Demetrios Loll MD ELECTRONICALLY SIGNED 10/06/2014 16:41

## 2014-10-24 ENCOUNTER — Ambulatory Visit: Payer: Medicare Other | Admitting: Anesthesiology

## 2014-10-24 ENCOUNTER — Encounter
Admission: RE | Disposition: A | Discharge status: Home / Self Care | Payer: Self-pay | Source: Ambulatory Visit | Attending: Urology

## 2014-10-24 ENCOUNTER — Ambulatory Visit
Admission: RE | Admit: 2014-10-24 | Discharge: 2014-10-24 | Disposition: A | Discharge status: Home / Self Care | Payer: Medicare Other | Source: Ambulatory Visit | Attending: Urology | Admitting: Urology

## 2014-10-24 ENCOUNTER — Encounter: Payer: Self-pay | Admitting: *Deleted

## 2014-10-24 DIAGNOSIS — M1990 Unspecified osteoarthritis, unspecified site: Secondary | ICD-10-CM | POA: Insufficient documentation

## 2014-10-24 DIAGNOSIS — E119 Type 2 diabetes mellitus without complications: Secondary | ICD-10-CM | POA: Diagnosis not present

## 2014-10-24 DIAGNOSIS — G4730 Sleep apnea, unspecified: Secondary | ICD-10-CM | POA: Insufficient documentation

## 2014-10-24 DIAGNOSIS — Z8249 Family history of ischemic heart disease and other diseases of the circulatory system: Secondary | ICD-10-CM | POA: Insufficient documentation

## 2014-10-24 DIAGNOSIS — Z79899 Other long term (current) drug therapy: Secondary | ICD-10-CM | POA: Insufficient documentation

## 2014-10-24 DIAGNOSIS — I10 Essential (primary) hypertension: Secondary | ICD-10-CM | POA: Diagnosis not present

## 2014-10-24 DIAGNOSIS — D494 Neoplasm of unspecified behavior of bladder: Secondary | ICD-10-CM | POA: Diagnosis present

## 2014-10-24 DIAGNOSIS — E785 Hyperlipidemia, unspecified: Secondary | ICD-10-CM | POA: Diagnosis not present

## 2014-10-24 DIAGNOSIS — Z8489 Family history of other specified conditions: Secondary | ICD-10-CM | POA: Insufficient documentation

## 2014-10-24 DIAGNOSIS — C672 Malignant neoplasm of lateral wall of bladder: Secondary | ICD-10-CM

## 2014-10-24 DIAGNOSIS — Z87891 Personal history of nicotine dependence: Secondary | ICD-10-CM | POA: Insufficient documentation

## 2014-10-24 HISTORY — PX: TRANSURETHRAL RESECTION OF BLADDER TUMOR: SHX2575

## 2014-10-24 LAB — GLUCOSE, CAPILLARY: Glucose-Capillary: 132 mg/dL — ABNORMAL HIGH (ref 70–99)

## 2014-10-24 SURGERY — TURBT (TRANSURETHRAL RESECTION OF BLADDER TUMOR)
Anesthesia: General | Wound class: Clean Contaminated

## 2014-10-24 MED ORDER — NUCYNTA 50 MG PO TABS: 50.0000 mg | ORAL_TABLET | Freq: Four times a day (QID) | ORAL | Status: DC | PRN

## 2014-10-24 MED ORDER — PROPOFOL 10 MG/ML IV BOLUS
INTRAVENOUS | Status: DC | PRN
  Administered 2014-10-24: 70 mg via INTRAVENOUS

## 2014-10-24 MED ORDER — ONDANSETRON HCL 4 MG/2ML IJ SOLN
INTRAMUSCULAR | Status: DC | PRN
  Administered 2014-10-24: 4 mg via INTRAVENOUS

## 2014-10-24 MED ORDER — FENTANYL CITRATE (PF) 100 MCG/2ML IJ SOLN: 25.0000 ug | INTRAMUSCULAR | Status: DC | PRN

## 2014-10-24 MED ORDER — SODIUM CHLORIDE 0.9 % IV SOLN
INTRAVENOUS | Status: DC
  Administered 2014-10-24: 14:00:00 via INTRAVENOUS

## 2014-10-24 MED ORDER — LEVOFLOXACIN IN D5W 500 MG/100ML IV SOLN
INTRAVENOUS | Status: AC
  Administered 2014-10-24: 500 mg via INTRAVENOUS
  Filled 2014-10-24: qty 100

## 2014-10-24 MED ORDER — FENTANYL CITRATE (PF) 100 MCG/2ML IJ SOLN
INTRAMUSCULAR | Status: DC | PRN
  Administered 2014-10-24 (×4): 25 ug via INTRAVENOUS

## 2014-10-24 MED ORDER — IPRATROPIUM-ALBUTEROL 0.5-2.5 (3) MG/3ML IN SOLN
3.0000 mL | Freq: Once | RESPIRATORY_TRACT | Status: AC
  Administered 2014-10-24: 3 mL via RESPIRATORY_TRACT

## 2014-10-24 MED ORDER — DOCUSATE SODIUM 100 MG PO CAPS: 200.0000 mg | ORAL_CAPSULE | Freq: Two times a day (BID) | ORAL | Status: DC

## 2014-10-24 MED ORDER — LIDOCAINE HCL (CARDIAC) 20 MG/ML IV SOLN
INTRAVENOUS | Status: DC | PRN
  Administered 2014-10-24: 60 mg via INTRAVENOUS

## 2014-10-24 MED ORDER — FAMOTIDINE 20 MG PO TABS
ORAL_TABLET | ORAL | Status: AC
  Administered 2014-10-24: 20 mg via ORAL
  Filled 2014-10-24: qty 1

## 2014-10-24 MED ORDER — UROGESIC-BLUE 81.6 MG PO TABS: 1.0000 | ORAL_TABLET | Freq: Four times a day (QID) | ORAL | Status: DC | PRN

## 2014-10-24 MED ORDER — HYOSCYAMINE SULFATE 0.125 MG SL SUBL: 0.1250 mg | SUBLINGUAL_TABLET | SUBLINGUAL | Status: DC | PRN

## 2014-10-24 MED ORDER — MITOMYCIN CHEMO FOR BLADDER INSTILLATION 40 MG
INTRAVENOUS | Status: DC | PRN
  Administered 2014-10-24: 40 mg via INTRAVESICAL

## 2014-10-24 MED ORDER — FAMOTIDINE 20 MG PO TABS
ORAL_TABLET | ORAL | Status: AC
  Filled 2014-10-24: qty 1

## 2014-10-24 MED ORDER — LEVOFLOXACIN 500 MG PO TABS: 500.0000 mg | ORAL_TABLET | Freq: Every day | ORAL | Status: DC

## 2014-10-24 MED ORDER — IPRATROPIUM-ALBUTEROL 0.5-2.5 (3) MG/3ML IN SOLN
RESPIRATORY_TRACT | Status: AC
  Filled 2014-10-24: qty 3

## 2014-10-24 MED ORDER — LIDOCAINE HCL 2 % EX GEL
CUTANEOUS | Status: AC
  Filled 2014-10-24: qty 10

## 2014-10-24 MED ORDER — BELLADONNA ALKALOIDS-OPIUM 16.2-60 MG RE SUPP
RECTAL | Status: DC | PRN
  Administered 2014-10-24: 1 via RECTAL

## 2014-10-24 MED ORDER — BELLADONNA ALKALOIDS-OPIUM 16.2-60 MG RE SUPP
RECTAL | Status: AC
  Filled 2014-10-24: qty 1

## 2014-10-24 MED ORDER — ONDANSETRON HCL 4 MG/2ML IJ SOLN: 4.0000 mg | Freq: Once | INTRAMUSCULAR | Status: DC | PRN

## 2014-10-24 MED ORDER — FAMOTIDINE 20 MG PO TABS
20.0000 mg | ORAL_TABLET | Freq: Once | ORAL | Status: AC
  Administered 2014-10-24: 20 mg via ORAL

## 2014-10-24 MED ORDER — LIDOCAINE HCL 2 % EX GEL
CUTANEOUS | Status: DC | PRN
  Administered 2014-10-24: 1

## 2014-10-24 MED ORDER — STERILE WATER FOR IRRIGATION IR SOLN
Status: DC | PRN
  Administered 2014-10-24: 12000 mL

## 2014-10-24 MED ORDER — LEVOFLOXACIN IN D5W 500 MG/100ML IV SOLN
500.0000 mg | INTRAVENOUS | Status: AC
  Administered 2014-10-24: 500 mg via INTRAVENOUS

## 2014-10-24 SURGICAL SUPPLY — 21 items
BAG DRAIN CYSTO-URO LG1000N (MISCELLANEOUS) ×2 IMPLANT
BAG URO DRAIN 2000ML W/SPOUT (MISCELLANEOUS) ×2 IMPLANT
CATH FOLEY 2WAY  5CC 20FR SIL (CATHETERS) ×1
CATH FOLEY 2WAY 5CC 20FR SIL (CATHETERS) ×1 IMPLANT
ELECT RESECT LOOP CUT 26F (MISCELLANEOUS) ×2 IMPLANT
ELECT RESECT POWERBALL 24F (MISCELLANEOUS) ×2 IMPLANT
GLOVE BIO SURGEON STRL SZ7 (GLOVE) ×4 IMPLANT
GLOVE BIO SURGEON STRL SZ7.5 (GLOVE) ×2 IMPLANT
GOWN STRL REUS W/ TWL LRG LVL3 (GOWN DISPOSABLE) ×1 IMPLANT
GOWN STRL REUS W/ TWL XL LVL3 (GOWN DISPOSABLE) ×1 IMPLANT
GOWN STRL REUS W/TWL LRG LVL3 (GOWN DISPOSABLE) ×1
GOWN STRL REUS W/TWL XL LVL3 (GOWN DISPOSABLE) ×1
KIT RM TURNOVER CYSTO AR (KITS) ×2 IMPLANT
PACK CYSTO AR (MISCELLANEOUS) ×2 IMPLANT
PAD GROUND ADULT SPLIT (MISCELLANEOUS) ×2 IMPLANT
PLUG CATH AND CAP STER (CATHETERS) ×2 IMPLANT
PREP PVP WINGED SPONGE (MISCELLANEOUS) ×2 IMPLANT
SET IRRIG Y TYPE TUR BLADDER L (SET/KITS/TRAYS/PACK) ×2 IMPLANT
SYRINGE IRR TOOMEY STRL 70CC (SYRINGE) ×2 IMPLANT
WATER STERILE IRR 1000ML POUR (IV SOLUTION) ×2 IMPLANT
WATER STERILE IRR 3000ML UROMA (IV SOLUTION) ×6 IMPLANT

## 2014-10-24 NOTE — Anesthesia Preprocedure Evaluation (Signed)
Anesthesia Evaluation  Patient identified by MRN, date of birth, ID band Patient awake    Reviewed: Allergy & Precautions, NPO status , Patient's Chart, lab work & pertinent test results  Airway Mallampati: II       Dental   Pulmonary neg pulmonary ROS, sleep apnea , former smoker,          Cardiovascular hypertension, negative cardio ROS  + dysrhythmias Rhythm:irregular     Neuro/Psych negative neurological ROS  negative psych ROS   GI/Hepatic negative GI ROS, Neg liver ROS,   Endo/Other  negative endocrine ROSdiabetes  Renal/GU negative Renal ROS  negative genitourinary   Musculoskeletal negative musculoskeletal ROS (+)   Abdominal   Peds negative pediatric ROS (+)  Hematology negative hematology ROS (+)   Anesthesia Other Findings   Reproductive/Obstetrics negative OB ROS                             Anesthesia Physical Anesthesia Plan  ASA: III  Anesthesia Plan: General LMA   Post-op Pain Management:    Induction:   Airway Management Planned:   Additional Equipment:   Intra-op Plan:   Post-operative Plan:   Informed Consent: I have reviewed the patients History and Physical, chart, labs and discussed the procedure including the risks, benefits and alternatives for the proposed anesthesia with the patient or authorized representative who has indicated his/her understanding and acceptance.     Plan Discussed with:   Anesthesia Plan Comments:         Anesthesia Quick Evaluation

## 2014-10-24 NOTE — Op Note (Signed)
Preoperative diagnosis: Bladder cancer Postoperative diagnosis: Bladder cancer  Procedure:   1. transurethral resection of bladder tumor                         2. instillation of mitomycin into the bladder   Surgeon: Otelia Limes. Yves Dill MD, FACS Anesthesia: Gen.  Indications:See the history and physical. After informed consent the above procedure(s) were requested     Technique and findings: After adequate general anesthesia obtained the patient was placed into dorsal lithotomy position and the perineum was prepped and draped in usual fashion. The patient had meatal stenosis requiring dilatation of the meatus to 19 [place] with male dilators. At this point the 9 French obturator sheath was advanced into the bladder with the obturator in place. The resectoscope was then coupled with the camera and and advanced into the sheath. The bladder was then thoroughly inspected. Both ureteral orifices were identified and had clear reflux. Patient was noted to have a 20 x 20 mm tumor along the right bladder wall just lateral to the ureteral orifice. At this point the tumor was resected with the loop electrode. Tumor fragments were submitted to pathology. The base of the tumor was fulgurated with the rollerball electrode. At this point the resectoscope was removed. 10 cc of viscous Xylocaine was instilled within the urethra and bladder. A 20 French silicone catheter was placed and irrigated until clear. 40 mg of mitomycin-C was then instilled within the bladder and the catheter was plugged. A B&O suppository was placed. Procedure was then terminated and the patient was transferred to the recovery room in stable condition.

## 2014-10-24 NOTE — H&P (Signed)
  Date of Initial H&P: 10/24/14  History reviewed, patient examined, no change in status, stable for surgery.

## 2014-10-24 NOTE — Anesthesia Postprocedure Evaluation (Signed)
  Anesthesia Post-op Note  Patient: Herbert Phillips  Procedure(s) Performed: Procedure(s): TRANSURETHRAL RESECTION OF BLADDER TUMOR (TURBT) (N/A)  Anesthesia type:General LMA  Patient location: PACU  Post pain: Pain level controlled  Post assessment: Post-op Vital signs reviewed, Patient's Cardiovascular Status Stable, Respiratory Function Stable, Patent Airway and No signs of Nausea or vomiting  Post vital signs: Reviewed and stable  Last Vitals:  Filed Vitals:   10/24/14 1615  BP:   Pulse: 71  Temp:   Resp:     Level of consciousness: awake, alert  and patient cooperative  Complications: No apparent anesthesia complications

## 2014-10-24 NOTE — Transfer of Care (Signed)
Immediate Anesthesia Transfer of Care Note  Patient: Herbert Phillips  Procedure(s) Performed: Procedure(s): TRANSURETHRAL RESECTION OF BLADDER TUMOR (TURBT) (N/A)  Patient Location: PACU  Anesthesia Type:General  Level of Consciousness: awake, alert  and oriented  Airway & Oxygen Therapy: Patient Spontanous Breathing and Patient connected to face mask oxygen  Post-op Assessment: Post -op Vital signs reviewed and stable  Post vital signs: Reviewed  Last Vitals:  Filed Vitals:   10/24/14 1341  BP: 121/71  Pulse: 71  Temp: 36.7 C  Resp: 16    Complications: No apparent anesthesia complications

## 2014-10-24 NOTE — Progress Notes (Signed)
Demonstrated urinary irrigation procedure with wife and son. 40cc of NS instilled with no resistance and flowed back immediately. Verbalizes understanding. Handout given for procedure as well

## 2014-10-24 NOTE — Progress Notes (Signed)
When urinary catheter was unplugged, urine was drained into urinal and substance added to make urine gel, disposed of in biohazard trash

## 2014-10-24 NOTE — Anesthesia Procedure Notes (Signed)
Procedure Name: LMA Insertion Date/Time: 10/24/2014 2:35 PM Performed by: Dionne Bucy Pre-anesthesia Checklist: Patient identified, Emergency Drugs available, Suction available, Patient being monitored and Timeout performed Patient Re-evaluated:Patient Re-evaluated prior to inductionOxygen Delivery Method: Circle system utilized Preoxygenation: Pre-oxygenation with 100% oxygen Intubation Type: IV induction LMA: LMA inserted LMA Size: 4.0 Number of attempts: 1 Placement Confirmation: positive ETCO2 Tube secured with: Tape Dental Injury: Teeth and Oropharynx as per pre-operative assessment

## 2014-10-27 LAB — SURGICAL PATHOLOGY

## 2014-10-30 ENCOUNTER — Encounter: Payer: Self-pay | Admitting: Urology

## 2014-11-14 ENCOUNTER — Encounter (INDEPENDENT_AMBULATORY_CARE_PROVIDER_SITE_OTHER): Payer: Self-pay

## 2014-11-14 ENCOUNTER — Inpatient Hospital Stay: Payer: Medicare Other

## 2014-11-14 ENCOUNTER — Inpatient Hospital Stay: Payer: Medicare Other | Attending: Oncology | Admitting: Oncology

## 2014-11-14 VITALS — BP 125/65 | HR 57 | Temp 97.5°F | Resp 18 | Ht 72.0 in | Wt 178.8 lb

## 2014-11-14 DIAGNOSIS — Z87891 Personal history of nicotine dependence: Secondary | ICD-10-CM | POA: Insufficient documentation

## 2014-11-14 DIAGNOSIS — C672 Malignant neoplasm of lateral wall of bladder: Secondary | ICD-10-CM | POA: Insufficient documentation

## 2014-11-14 DIAGNOSIS — H9190 Unspecified hearing loss, unspecified ear: Secondary | ICD-10-CM | POA: Diagnosis not present

## 2014-11-14 DIAGNOSIS — R001 Bradycardia, unspecified: Secondary | ICD-10-CM | POA: Insufficient documentation

## 2014-11-14 DIAGNOSIS — M129 Arthropathy, unspecified: Secondary | ICD-10-CM | POA: Diagnosis not present

## 2014-11-14 DIAGNOSIS — E119 Type 2 diabetes mellitus without complications: Secondary | ICD-10-CM | POA: Diagnosis not present

## 2014-11-14 DIAGNOSIS — Z79899 Other long term (current) drug therapy: Secondary | ICD-10-CM | POA: Diagnosis not present

## 2014-11-14 DIAGNOSIS — E538 Deficiency of other specified B group vitamins: Secondary | ICD-10-CM | POA: Diagnosis not present

## 2014-11-14 DIAGNOSIS — G4730 Sleep apnea, unspecified: Secondary | ICD-10-CM | POA: Diagnosis not present

## 2014-11-14 DIAGNOSIS — M549 Dorsalgia, unspecified: Secondary | ICD-10-CM | POA: Diagnosis not present

## 2014-11-14 DIAGNOSIS — I10 Essential (primary) hypertension: Secondary | ICD-10-CM | POA: Insufficient documentation

## 2014-11-14 DIAGNOSIS — M2550 Pain in unspecified joint: Secondary | ICD-10-CM | POA: Insufficient documentation

## 2014-11-14 LAB — CBC WITH DIFFERENTIAL/PLATELET
BASOS ABS: 0.1 10*3/uL (ref 0–0.1)
Basophils Relative: 1 %
Eosinophils Absolute: 0.2 10*3/uL (ref 0–0.7)
Eosinophils Relative: 2 %
HCT: 44.9 % (ref 40.0–52.0)
Hemoglobin: 14.6 g/dL (ref 13.0–18.0)
LYMPHS ABS: 2.1 10*3/uL (ref 1.0–3.6)
LYMPHS PCT: 22 %
MCH: 29.9 pg (ref 26.0–34.0)
MCHC: 32.6 g/dL (ref 32.0–36.0)
MCV: 91.8 fL (ref 80.0–100.0)
MONO ABS: 0.9 10*3/uL (ref 0.2–1.0)
Monocytes Relative: 10 %
NEUTROS PCT: 65 %
Neutro Abs: 5.9 10*3/uL (ref 1.4–6.5)
Platelets: 247 10*3/uL (ref 150–440)
RBC: 4.89 MIL/uL (ref 4.40–5.90)
RDW: 13.7 % (ref 11.5–14.5)
WBC: 9.1 10*3/uL (ref 3.8–10.6)

## 2014-11-14 LAB — COMPREHENSIVE METABOLIC PANEL
ALBUMIN: 3.5 g/dL (ref 3.5–5.0)
ALK PHOS: 66 U/L (ref 38–126)
ALT: 13 U/L — AB (ref 17–63)
AST: 17 U/L (ref 15–41)
Anion gap: 6 (ref 5–15)
BILIRUBIN TOTAL: 0.4 mg/dL (ref 0.3–1.2)
BUN: 19 mg/dL (ref 6–20)
CALCIUM: 8.5 mg/dL — AB (ref 8.9–10.3)
CO2: 26 mmol/L (ref 22–32)
Chloride: 104 mmol/L (ref 101–111)
Creatinine, Ser: 1.09 mg/dL (ref 0.61–1.24)
GFR, EST NON AFRICAN AMERICAN: 59 mL/min — AB (ref >60)
GLUCOSE: 120 mg/dL — AB (ref 65–99)
Potassium: 4.2 mmol/L (ref 3.5–5.1)
Sodium: 136 mmol/L (ref 135–145)
Total Protein: 7.4 g/dL (ref 6.5–8.1)

## 2014-11-14 NOTE — Progress Notes (Signed)
Referred by Dr. Yves Dill for bladder cancer.  Does have living will. Asked for copy for chart.

## 2014-11-15 ENCOUNTER — Encounter: Payer: Self-pay | Admitting: Oncology

## 2014-11-15 NOTE — Progress Notes (Signed)
[place] @ [facility] [phone]  [phone]   INITIAL CONSULT  Dwain Huhn OB: 12-24-1925  MR#: [id number]  PPJ#:[id number]  Patient Care Team: Albina Billet, MD as PCP - General (Internal Medicine) Royston Cowper, MD as Consulting Physician (Urology)  CHIEF COMPLAINT:  Chief Complaint  Patient presents with  . New Evaluation    VISIT DIAGNOSIS:     ICD-9-CM ICD-10-CM   1. 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  Tumor was muscle invasion with lymphovascular invasion Patient  had a transurethral resection of bladder tumor (Oct 24, 2014)       Bladder cancer   10/24/2014 Initial Diagnosis Bladder cancer    Oncology Flowsheet 10/24/2014  mitoMYcin (MUTAMYCIN) IV -  ondansetron (ZOFRAN) IV -    INTERVAL HISTORY: 81 year old gentleman had intermittent gross painless hematuria.  Started 4 weeks ago patient on underwent cystoscopy and CT scan.  Patient was supposed to have cystoscopy in April however felt weak was admitted in the hospital had a cardiac evaluation no significant problem was found.  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  Patient had transurethral resection of bladder tumor and mitomycin was instilled. He was then referred to me for further evaluation and treatment planning. No hematuria.  Family by the side of the patient today.  REVIEW OF SYSTEMS:   Gen. status: Patient is alert oriented not in any acute distress.  No chills.  No fever.  HEENT: No headache.  Hard of  hearing.  No soreness in the mouth. Lungs: No shortness of breath.  Dyspnea on exertion.  No cough.  No hemoptysis. Cardiac: No chest pain.  No palpitation patient does have hypertension Endocrine system: Patient does have diabetes GI: No nausea no vomiting no diarrhea of GU: Hematuria has resolved completely. Musculoskeletal system chronic back pain and joint pains Lower extremitywell being Skin: No rash Psychiatric system no significant problem  As per HPI. Otherwise, a complete review of systems is negatve.  PAST MEDICAL HISTORY: Past Medical History  Diagnosis Date  . Non-insulin dependent type 2 diabetes mellitus   . Arthritis   . Hypertension   . Sleep apnea   . B12 deficiency   . Dysrhythmia     Bradycardia    PAST SURGICAL HISTORY: Past Surgical History  Procedure Laterality Date  . Cyst on spine  1961  . Ear cyst excision    . Transurethral resection of bladder tumor N/A 10/24/2014    Procedure: TRANSURETHRAL RESECTION OF BLADDER TUMOR (TURBT);  Surgeon: Royston Cowper, MD;  Location: [facility] ORS;  Service: Urology;  Laterality: N/A;    FAMILY HISTORY Family History  Problem Relation Age of Onset  . Diabetes Neg Hx    no significant family history of colon cancer, ovarian cancer, breast cancer Mother died of Alzheimer's disease.  At age 44      ADVANCED DIRECTIVES: Patient does have advanced healthcare directive   HEALTH MAINTENANCE: History  Substance Use Topics  . Smoking status: Former Smoker  Quit date: 06/13/1976  . Smokeless tobacco: Never Used  . Alcohol Use: Yes     Comment: monthly 3 beers since xmas      No Known Allergies  Current Outpatient Prescriptions  Medication Sig Dispense Refill  . docusate sodium (COLACE) 100 MG capsule Take 2 capsules (200 mg total) by mouth 2 (two) times daily. 120 capsule 3  . furosemide (LASIX) 20 MG tablet Take 20 mg by mouth.    . Glucosamine HCl (GLUCOSAMINE PO) Take 1,500 mg by mouth 2 (two) times  daily.     Marland Kitchen glyBURIDE (DIABETA) 5 MG tablet Take 5 mg by mouth daily with breakfast.    . lovastatin (MEVACOR) 40 MG tablet Take 40 mg by mouth at bedtime.    . metFORMIN (GLUCOPHAGE) 1000 MG tablet Take 500 mg by mouth 2 (two) times daily with a meal.     . ramipril (ALTACE) 2.5 MG capsule Take 2.5 mg by mouth daily.    . saw palmetto 500 MG capsule Take 900 mg by mouth daily.    . tamsulosin (FLOMAX) 0.4 MG CAPS capsule     . vitamin B-12 (CYANOCOBALAMIN) 1000 MCG tablet Take 1,000 mcg by mouth daily.    . hyoscyamine (LEVSIN/SL) 0.125 MG SL tablet Place 1 tablet (0.125 mg total) under the tongue every 4 (four) hours as needed. 1-2 TABS (Patient not taking: Reported on 11/14/2014) 40 tablet 3  . levofloxacin (LEVAQUIN) 500 MG tablet Take 1 tablet (500 mg total) by mouth daily. (Patient not taking: Reported on 11/14/2014) 7 tablet 0  . Methen-Hyosc-Meth Blue-Na Phos (UROGESIC-BLUE) 81.6 MG TABS Take 1 tablet (81.6 mg total) by mouth every 6 (six) hours as needed. (Patient not taking: Reported on 11/14/2014) 40 tablet 3  . NUCYNTA 50 MG TABS tablet Take 1 tablet (50 mg total) by mouth every 6 (six) hours as needed. 1 TO 2 TABS Q 6 HOURS PRN PAIN (Patient not taking: Reported on 11/14/2014) 30 tablet 0   No current facility-administered medications for this visit.    OBJECTIVE: PHYSICAL EXAM: No known status: Elderly individual not any acute distress HEENT: No abnormality detected.  No palpable neck masses.  Thyroid within normal limit Lungs: Emphysematous chest.  No rhonchi no rales Cardiac: Soft systolic murmur.  Regular heart sounds bradycardia Abdomen: Protuberant abdomen.  No ascites.  No palpable masses. Lower extremity no edema Skin: No rash Musculoskeletal system: Kyphoscoliosis Neurological system: Higher functions within normal limit.  Cranial nodes are intact.  No other localizing sign   Filed Vitals:   11/14/14 1434  BP: 125/65  Pulse: 57  Temp: 97.5 F (36.4 C)  Resp: 18       Body mass index is 24.24 kg/(m^2).    ECOG FS:1 - Symptomatic but completely ambulatory  LAB RESULTS:  Appointment on 11/14/2014  Component Date Value Ref Range Status  . WBC 11/14/2014 9.1  3.8 - 10.6 K/uL Final  . RBC 11/14/2014 4.89  4.40 - 5.90 MIL/uL Final  . Hemoglobin 11/14/2014 14.6  13.0 - 18.0 g/dL Final  . HCT 11/14/2014 44.9  40.0 - 52.0 % Final  . MCV 11/14/2014 91.8  80.0 - 100.0 fL Final  . MCH 11/14/2014 29.9  26.0 - 34.0 pg Final  . MCHC 11/14/2014 32.6  32.0 - 36.0 g/dL Final  . RDW 11/14/2014 13.7  11.5 - 14.5 % Final  . Platelets 11/14/2014 247  150 - 440 K/uL Final  . Neutrophils Relative % 11/14/2014 65   Final  .  Neutro Abs 11/14/2014 5.9  1.4 - 6.5 K/uL Final  . Lymphocytes Relative 11/14/2014 22   Final  . Lymphs Abs 11/14/2014 2.1  1.0 - 3.6 K/uL Final  . Monocytes Relative 11/14/2014 10   Final  . Monocytes Absolute 11/14/2014 0.9  0.2 - 1.0 K/uL Final  . Eosinophils Relative 11/14/2014 2   Final  . Eosinophils Absolute 11/14/2014 0.2  0 - 0.7 K/uL Final  . Basophils Relative 11/14/2014 1   Final  . Basophils Absolute 11/14/2014 0.1  0 - 0.1 K/uL Final  . Sodium 11/14/2014 136  135 - 145 mmol/L Final  . Potassium 11/14/2014 4.2  3.5 - 5.1 mmol/L Final  . Chloride 11/14/2014 104  101 - 111 mmol/L Final  . CO2 11/14/2014 26  22 - 32 mmol/L Final  . Glucose, Bld 11/14/2014 120* 65 - 99 mg/dL Final  . BUN 11/14/2014 19  6 - 20 mg/dL Final  . Creatinine, Ser 11/14/2014 1.09  0.61 - 1.24 mg/dL Final  . Calcium 11/14/2014 8.5* 8.9 - 10.3 mg/dL Final  . Total Protein 11/14/2014 7.4  6.5 - 8.1 g/dL Final  . Albumin 11/14/2014 3.5  3.5 - 5.0 g/dL Final  . AST 11/14/2014 17  15 - 41 U/L Final  . ALT 11/14/2014 13* 17 - 63 U/L Final  . Alkaline Phosphatase 11/14/2014 66  38 - 126 U/L Final  . Total Bilirubin 11/14/2014 0.4  0.3 - 1.2 mg/dL Final  . GFR calc non Af Amer 11/14/2014 59* >60 mL/min Final  . GFR calc Af Amer 11/14/2014 >60  >60 mL/min Final    Comment: (NOTE) The eGFR has been calculated using the CKD EPI equation. This calculation has not been validated in all clinical situations. eGFR's persistently <60 mL/min signify possible Chronic Kidney Disease.   . Anion gap 11/14/2014 6  5 - 15 Final     STUDIES: No results found.  ASSESSMENT:  Muscle invasive carcinoma bladder History of diabetes History of hypertension  PLAN:  I reviewed CT scan and pathology.  In elderly patient muscle invasive bladder cancer always a challenge in terms of management. Following options have been discussed. 1 standard  option would be neoadjuvant chemotherapy followed by total cystectomy. .At his age cystectomy as well as aggressive neoadjuvant chemotherapy may be difficult to carry out and patient also does not want to go through extensive cystectomy procedure.   2.  Second best option would be concurrent radiation chemotherapy Followed by cystoscopy and hoping that we will be complete resolution of the bladder tumor 3.  Third  option would be observation and follow this patient regularly with cystoscopy and a CT scan. 4.  Patient and family will decide.  Meanwhile patient is seeing a radiation oncologist case also would be discussed in tumor conference All these options had been discussed with the patient and family.  Patient's wife and children were present.  Case was also discussed with Dr. Baruch Gouty, our radiation oncologist  Patient expressed understanding and was in agreement with this plan. He also understands that He can call clinic at any time with any questions, concerns, or complaints.    Bladder cancer   Staging form: Urinary Bladder, AJCC 7th Edition     Clinical: Stage II (T2, N0, M0) - Signed by Forest Gleason, MD on 11/15/2014   Forest Gleason, MD   11/15/2014 2:05 PM

## 2014-11-22 ENCOUNTER — Ambulatory Visit
Admission: RE | Admit: 2014-11-22 | Discharge: 2014-11-22 | Disposition: A | Discharge status: Home / Self Care | Payer: Medicare Other | Source: Ambulatory Visit | Attending: Radiation Oncology | Admitting: Radiation Oncology

## 2014-11-22 ENCOUNTER — Encounter: Payer: Self-pay | Admitting: Radiation Oncology

## 2014-11-22 ENCOUNTER — Inpatient Hospital Stay: Payer: Medicare Other | Attending: Oncology | Admitting: Oncology

## 2014-11-22 VITALS — BP 130/61 | HR 63 | Resp 18 | Ht 72.0 in | Wt 180.1 lb

## 2014-11-22 VITALS — BP 132/74 | HR 63 | Temp 95.7°F | Wt 180.1 lb

## 2014-11-22 DIAGNOSIS — Z87891 Personal history of nicotine dependence: Secondary | ICD-10-CM | POA: Insufficient documentation

## 2014-11-22 DIAGNOSIS — I10 Essential (primary) hypertension: Secondary | ICD-10-CM | POA: Diagnosis not present

## 2014-11-22 DIAGNOSIS — R310 Gross hematuria: Secondary | ICD-10-CM

## 2014-11-22 DIAGNOSIS — M419 Scoliosis, unspecified: Secondary | ICD-10-CM

## 2014-11-22 DIAGNOSIS — R001 Bradycardia, unspecified: Secondary | ICD-10-CM | POA: Diagnosis not present

## 2014-11-22 DIAGNOSIS — G4730 Sleep apnea, unspecified: Secondary | ICD-10-CM | POA: Diagnosis not present

## 2014-11-22 DIAGNOSIS — E119 Type 2 diabetes mellitus without complications: Secondary | ICD-10-CM | POA: Insufficient documentation

## 2014-11-22 DIAGNOSIS — C674 Malignant neoplasm of posterior wall of bladder: Secondary | ICD-10-CM | POA: Diagnosis present

## 2014-11-22 DIAGNOSIS — R531 Weakness: Secondary | ICD-10-CM | POA: Insufficient documentation

## 2014-11-22 DIAGNOSIS — E538 Deficiency of other specified B group vitamins: Secondary | ICD-10-CM | POA: Diagnosis not present

## 2014-11-22 DIAGNOSIS — C679 Malignant neoplasm of bladder, unspecified: Secondary | ICD-10-CM

## 2014-11-22 DIAGNOSIS — M129 Arthropathy, unspecified: Secondary | ICD-10-CM | POA: Diagnosis not present

## 2014-11-22 DIAGNOSIS — Z510 Encounter for antineoplastic radiation therapy: Secondary | ICD-10-CM | POA: Insufficient documentation

## 2014-11-22 NOTE — Progress Notes (Signed)
Patient is former smoker.  Does have living will.

## 2014-11-22 NOTE — Consult Note (Signed)
Radiation Oncology NEW PATIENT EVALUATION  Name: Herbert Phillips  MRN: [id number]  Date:   11/22/2014     DOB: [birth year]   This 81 y.o. male patient presents to the clinic for initial evaluation of bladder cancer at least stage II (T2 A N0 M0).  REFERRING PHYSICIAN: Albina Billet, MD  CHIEF COMPLAINT: No chief complaint on file.   DIAGNOSIS: There were no encounter diagnoses.   PREVIOUS INVESTIGATIONS:  Pathology report reviewed CT scan reviewed Clinical notes reviewed  HPI: patient is a pleasant 81 year old gentleman who presented about a month a hal. He had cystoscopy on 10/24/2014  A mass in the right posterior wal. He underwent transurethral resection at the same time showingINVASIVE UROTHELIAL CARCINOMA, HIGH-GRADE WITH A MICROPAPILLARY  COMPONENT.  - MUSCULARIS PROPRIA AND LAMINA PROPRIA IS INVOLVED BY INVASIVE  CARCINOMA.  - LYMPHOVASCULAR INVASION IS PRESENT. Patient had intravesical mitomycin. CT scan demonstrated a 2.7 cm  Mass along the right posterior wall consistentnwith known findings at cystoscopy. He has been seen by medical onco based on his age and overall general condition radical cystectomy or neo-adjuvant chemotherapy was not recommended. He is seen today for consideratio external beam radiation and chemotherapy  PLANNED TREATMENT REGIMEN: combined modality chemotherapy and radiation therapy  PAST MEDICAL HISTORY:  has a past medical history of Non-insulin dependent type 2 diabetes mellitus; Arthritis; Hypertension; Sleep apnea; B12 deficiency; and Dysrhythmia.    PAST SURGICAL HISTORY:  Past Surgical History  Procedure Laterality Date  . Cyst on spine  1961  . Ear cyst excision    . Transurethral resection of bladder tumor N/A 10/24/2014    Procedure: TRANSURETHRAL RESECTION OF BLADDER TUMOR (TURBT);  Surgeon: Royston Cowper, MD;  Location: [facility] ORS;  Service: Urology;  Laterality: N/A;    FAMILY HISTORY: family history is negative for  Diabetes.  SOCIAL HISTORY:  reports that he quit smoking about 38 years ago. He has never used smokeless tobacco. He reports that he drinks alcohol. He reports that he does not use illicit drugs.  ALLERGIES: Review of patient's allergies indicates no known allergies.  MEDICATIONS:  Current Outpatient Prescriptions  Medication Sig Dispense Refill  . docusate sodium (COLACE) 100 MG capsule Take 2 capsules (200 mg total) by mouth 2 (two) times daily. 120 capsule 3  . furosemide (LASIX) 20 MG tablet Take 20 mg by mouth.    . Glucosamine HCl (GLUCOSAMINE PO) Take 1,500 mg by mouth 2 (two) times daily.     Marland Kitchen glyBURIDE (DIABETA) 5 MG tablet Take 5 mg by mouth daily with breakfast.    . hyoscyamine (LEVSIN/SL) 0.125 MG SL tablet Place 1 tablet (0.125 mg total) under the tongue every 4 (four) hours as needed. 1-2 TABS (Patient not taking: Reported on 11/14/2014) 40 tablet 3  . levofloxacin (LEVAQUIN) 500 MG tablet Take 1 tablet (500 mg total) by mouth daily. (Patient not taking: Reported on 11/14/2014) 7 tablet 0  . lovastatin (MEVACOR) 40 MG tablet Take 40 mg by mouth at bedtime.    . metFORMIN (GLUCOPHAGE) 1000 MG tablet Take 500 mg by mouth 2 (two) times daily with a meal.     . Methen-Hyosc-Meth Blue-Na Phos (UROGESIC-BLUE) 81.6 MG TABS Take 1 tablet (81.6 mg total) by mouth every 6 (six) hours as needed. (Patient not taking: Reported on 11/14/2014) 40 tablet 3  . NUCYNTA 50 MG TABS tablet Take 1 tablet (50 mg total) by mouth every 6 (six) hours as needed. 1 TO 2 TABS Q 6 HOURS  PRN PAIN (Patient not taking: Reported on 11/14/2014) 30 tablet 0  . ramipril (ALTACE) 2.5 MG capsule Take 2.5 mg by mouth daily.    . saw palmetto 500 MG capsule Take 900 mg by mouth daily.    . tamsulosin (FLOMAX) 0.4 MG CAPS capsule     . vitamin B-12 (CYANOCOBALAMIN) 1000 MCG tablet Take 1,000 mcg by mouth daily.     No current facility-administered medications for this encounter.    ECOG PERFORMANCE STATUS:  0 -  Asymptomatic  REVIEW OF SYSTEMS:  Patient denies any weight loss, fatigue, weakness, fever, chills or night sweats. Patient denies any loss of vision, blurred vision. Patient denies any ringing  of the ears or hearing loss. No irregular heartbeat. Patient denies heart murmur or history of fainting. Patient denies any chest pain or pain radiating to her upper extremities. Patient denies any shortness of breath, difficulty breathing at night, cough or hemoptysis. Patient denies any swelling in the lower legs. Patient denies any nausea vomiting, vomiting of blood, or coffee ground material in the vomitus. Patient denies any stomach pain. Patient states has had normal bowel movements no significant constipation or diarrhea. Patient denies any dysuria, hematuria or significant nocturia. Patient denies any problems walking, swelling in the joints or loss of balance. Patient denies any skin changes, loss of hair or loss of weight. Patient denies any excessive worrying or anxiety or significant depression. Patient denies any problems with insomnia. Patient denies excessive thirst, polyuria, polydipsia. Patient denies any swollen glands, patient denies easy bruising or easy bleeding. Patient denies any recent infections, allergies or URI. Patient "s visual fields have not changed significantly in recent time.    PHYSICAL EXAM: BP 130/61 mmHg  Pulse 63  Resp 18  Ht 6' (1.829 m)  Wt 180 lb 1.9 oz (81.7 kg)  BMI 24.42 kg/m2 Well-developed elderly male in NAD Well-developed well-nourished patient in NAD. HEENT reveals PERLA, EOMI, discs not visualized.  Oral cavity is clear. No oral mucosal lesions are identified. Neck is clear without evidence of cervical or supraclavicular adenopathy. Lungs are clear to A&P. Cardiac examination is essentially unremarkable with regular rate and rhythm without murmur rub or thrill. Abdomen is benign with no organomegaly or masses noted. Motor sensory and DTR levels are equal and  symmetric in the upper and lower extremities. Cranial nerves II through XII are grossly intact. Proprioception is intact. No peripheral adenopathy or edema is identified. No motor or sensory levels are noted. Crude visual fields are within normal range.   LABORATORY DATA:  Surgical pathology reports reviewed   RADIOLOGY RESULTS: CT scans reviewed IMPRESSION: at least stage II muscle invadin high-grade transitional cell carcinoma in 81 year old male.  PLAN: at this time I like to go ahead with whole pelvic radiation therapy to  4500 cGy boosting the right lateral bladder wall with an 2000 cGy. Risks and benefits of treatment including increased lower urinary tract  Symptoms, diarrhea, alteration of blood counts, fatigue all were discussed in detail with the patient and family. I have set up and ordered [place] for early next week.  I would like to take this opportunity for allowing me to participate in the care of your patient.Armstead Peaks., MD

## 2014-11-24 ENCOUNTER — Encounter: Payer: Self-pay | Admitting: Oncology

## 2014-11-24 NOTE — Progress Notes (Signed)
[name] @ [facility] Telephone:([phone]) [phone]  Fax:([phone]) [phone]   INITIAL CONSULT  Herbert Phillips OB: March 06, 1926  MR#: [id number]  OVF#:[id number]  Patient Care Team: Herbert Billet, MD as PCP - General (Internal Medicine) Herbert Cowper, MD as Consulting Physician (Urology)  CHIEF COMPLAINT:  Chief Complaint  Patient presents with  . Follow-up    VISIT DIAGNOSIS:   No diagnosis found.   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  Tumor was muscle invasion with lymphovascular invasion Patient  had a transurethral resection of bladder tumor (Oct 24, 2014)       Bladder cancer   10/24/2014 Initial Diagnosis Bladder cancer    Oncology Flowsheet 10/24/2014  mitoMYcin (MUTAMYCIN) IV -  ondansetron (ZOFRAN) IV -    INTERVAL HISTORY: 81 year old gentleman had intermittent gross painless hematuria.  Started 4 weeks ago patient on underwent cystoscopy and CT scan.  Patient was supposed to have cystoscopy in April however felt weak was admitted in the hospital had a cardiac evaluation no significant problem was found.  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  Patient had transurethral resection of bladder tumor and mitomycin was instilled. He was then referred to me for further evaluation and treatment planning. No hematuria.  Family by the side of the patient today. November 22, 2014  patient is here for ongoing evaluation.  Patient is being evaluated by radiation oncologist Radiation treatment had been recommended On current chemotherapy with cis-platinum is being discussed today  REVIEW OF SYSTEMS:   Gen. status: Patient is alert oriented not in any acute distress.  No chills.  No fever.  HEENT: No headache.  Hard of  hearing.  No soreness in the mouth. Lungs: No shortness of breath.  Dyspnea on exertion.  No cough.  No hemoptysis. Cardiac: No chest pain.  No palpitation patient does have hypertension Endocrine system: Patient does have diabetes GI: No nausea no vomiting no diarrhea of GU: Hematuria has resolved completely. Musculoskeletal system chronic back pain and joint pains Lower extremitywell being Skin: No rash Psychiatric system no significant problem  As per HPI. Otherwise, a complete review of systems is negatve.  PAST MEDICAL HISTORY: Past Medical History  Diagnosis Date  . Non-insulin dependent type 2 diabetes mellitus   . Arthritis   . Hypertension   . Sleep apnea   . B12 deficiency   . Dysrhythmia     Bradycardia    PAST SURGICAL HISTORY: Past Surgical History  Procedure Laterality Date  . Cyst on spine  1961  . Ear cyst excision    . Transurethral resection of bladder tumor N/A 10/24/2014    Procedure: TRANSURETHRAL RESECTION OF BLADDER TUMOR (TURBT);  Surgeon: Herbert Cowper, MD;  Location: [facility] ORS;  Service: Urology;  Laterality: N/A;    FAMILY HISTORY Family History  Problem Relation Age of Onset  . Diabetes Neg Hx    no significant family history of colon cancer, ovarian cancer, breast cancer Mother died of Alzheimer's disease.  At age 58      ADVANCED DIRECTIVES: Patient does have advanced healthcare directive   HEALTH MAINTENANCE: History  Substance Use Topics  . Smoking status: Former Smoker    Quit date: 06/13/1976  . Smokeless tobacco: Never Used  .  Alcohol Use: Yes     Comment: monthly 3 beers since xmas      No Known Allergies  Current Outpatient Prescriptions  Medication Sig Dispense Refill  . docusate sodium (COLACE) 100 MG capsule Take 2 capsules (200 mg total) by mouth 2 (two) times daily. 120 capsule 3  . furosemide (LASIX) 20 MG tablet Take 20 mg by mouth.    . Glucosamine HCl (GLUCOSAMINE PO) Take 1,500 mg by mouth 2 (two) times  daily.     Marland Kitchen glyBURIDE (DIABETA) 5 MG tablet Take 5 mg by mouth daily with breakfast.    . hyoscyamine (LEVSIN/SL) 0.125 MG SL tablet Place 1 tablet (0.125 mg total) under the tongue every 4 (four) hours as needed. 1-2 TABS 40 tablet 3  . levofloxacin (LEVAQUIN) 500 MG tablet Take 1 tablet (500 mg total) by mouth daily. 7 tablet 0  . lovastatin (MEVACOR) 40 MG tablet Take 40 mg by mouth at bedtime.    . metFORMIN (GLUCOPHAGE) 1000 MG tablet Take 500 mg by mouth 2 (two) times daily with a meal.     . Methen-Hyosc-Meth Blue-Na Phos (UROGESIC-BLUE) 81.6 MG TABS Take 1 tablet (81.6 mg total) by mouth every 6 (six) hours as needed. 40 tablet 3  . NUCYNTA 50 MG TABS tablet Take 1 tablet (50 mg total) by mouth every 6 (six) hours as needed. 1 TO 2 TABS Q 6 HOURS PRN PAIN 30 tablet 0  . ramipril (ALTACE) 2.5 MG capsule Take 2.5 mg by mouth daily.    . saw palmetto 500 MG capsule Take 900 mg by mouth daily.    . tamsulosin (FLOMAX) 0.4 MG CAPS capsule     . vitamin B-12 (CYANOCOBALAMIN) 1000 MCG tablet Take 1,000 mcg by mouth daily.     No current facility-administered medications for this visit.    OBJECTIVE: PHYSICAL EXAM: No known status: Elderly individual not any acute distress HEENT: No abnormality detected.  No palpable neck masses.  Thyroid within normal limit Lungs: Emphysematous chest.  No rhonchi no rales Cardiac: Soft systolic murmur.  Regular heart sounds bradycardia Abdomen: Protuberant abdomen.  No ascites.  No palpable masses. Lower extremity no edema Skin: No rash Musculoskeletal system: Kyphoscoliosis Neurological system: Higher functions within normal limit.  Cranial nodes are intact.  No other localizing sign   Filed Vitals:   11/22/14 1554  BP: 132/74  Pulse: 63  Temp: 95.7 F (35.4 C)     Body mass index is 24.42 kg/(m^2).    ECOG FS:1 - Symptomatic but completely ambulatory  LAB RESULTS:  No visits with results within 3 Day(s) from this visit. Latest known visit  with results is:  Appointment on 11/14/2014  Component Date Value Ref Range Status  . WBC 11/14/2014 9.1  3.8 - 10.6 K/uL Final  . RBC 11/14/2014 4.89  4.40 - 5.90 MIL/uL Final  . Hemoglobin 11/14/2014 14.6  13.0 - 18.0 g/dL Final  . HCT 11/14/2014 44.9  40.0 - 52.0 % Final  . MCV 11/14/2014 91.8  80.0 - 100.0 fL Final  . MCH 11/14/2014 29.9  26.0 - 34.0 pg Final  . MCHC 11/14/2014 32.6  32.0 - 36.0 g/dL Final  . RDW 11/14/2014 13.7  11.5 - 14.5 % Final  . Platelets 11/14/2014 247  150 - 440 K/uL Final  . Neutrophils Relative % 11/14/2014 65   Final  . Neutro Abs 11/14/2014 5.9  1.4 - 6.5 K/uL Final  . Lymphocytes Relative 11/14/2014 22   Final  .  Lymphs Abs 11/14/2014 2.1  1.0 - 3.6 K/uL Final  . Monocytes Relative 11/14/2014 10   Final  . Monocytes Absolute 11/14/2014 0.9  0.2 - 1.0 K/uL Final  . Eosinophils Relative 11/14/2014 2   Final  . Eosinophils Absolute 11/14/2014 0.2  0 - 0.7 K/uL Final  . Basophils Relative 11/14/2014 1   Final  . Basophils Absolute 11/14/2014 0.1  0 - 0.1 K/uL Final  . Sodium 11/14/2014 136  135 - 145 mmol/L Final  . Potassium 11/14/2014 4.2  3.5 - 5.1 mmol/L Final  . Chloride 11/14/2014 104  101 - 111 mmol/L Final  . CO2 11/14/2014 26  22 - 32 mmol/L Final  . Glucose, Bld 11/14/2014 120* 65 - 99 mg/dL Final  . BUN 11/14/2014 19  6 - 20 mg/dL Final  . Creatinine, Ser 11/14/2014 1.09  0.61 - 1.24 mg/dL Final  . Calcium 11/14/2014 8.5* 8.9 - 10.3 mg/dL Final  . Total Protein 11/14/2014 7.4  6.5 - 8.1 g/dL Final  . Albumin 11/14/2014 3.5  3.5 - 5.0 g/dL Final  . AST 11/14/2014 17  15 - 41 U/L Final  . ALT 11/14/2014 13* 17 - 63 U/L Final  . Alkaline Phosphatase 11/14/2014 66  38 - 126 U/L Final  . Total Bilirubin 11/14/2014 0.4  0.3 - 1.2 mg/dL Final  . GFR calc non Af Amer 11/14/2014 59* >60 mL/min Final  . GFR calc Af Amer 11/14/2014 >60  >60 mL/min Final   Comment: (NOTE) The eGFR has been calculated using the CKD EPI equation. This calculation  has not been validated in all clinical situations. eGFR's persistently <60 mL/min signify possible Chronic Kidney Disease.   . Anion gap 11/14/2014 6  5 - 15 Final     STUDIES: No results found.  ASSESSMENT:  Muscle invasive carcinoma bladder History of diabetes History of hypertension  PLAN:  I reviewed CT scan and pathology.  In elderly patient muscle invasive bladder cancer always a challenge in terms of management. Following options have been discussed. 1 standard  option would be neoadjuvant chemotherapy followed by total cystectomy. .At his age cystectomy as well as aggressive neoadjuvant chemotherapy may be difficult to carry out and patient also does not want to go through extensive cystectomy procedure.   2.  Second best option would be concurrent radiation chemotherapy Followed by cystoscopy and hoping that we will be complete resolution of the bladder tumor 3.  Third  option would be observation and follow this patient regularly with cystoscopy and a CT scan. 4.  Patient and family will decide.  Meanwhile patient is seeing a radiation oncologist case also would be discussed in tumor conference.  November 22, 2014 Patient was seen today by radiation oncologist radiation treatment has been recommended Concurrent chemotherapy of cis-platinum has been discussed and patient is agreeable to it All the side effects of chemotherapy including myelosuppression, alopecia, nausea vomiting fatigue weakness.  Secondary infection, and   peripheral neuropathy .  Has been discussed in details. Informal consent has been obtained and will be documented by nurses in the chart Intent of chemotherapy   is   Cure.  All these options had been discussed with the patient and family.  Patient's wife and children were present.  Case was also discussed with Dr. Baruch Gouty, our radiation oncologist Total duration of visit was 45 minutes.  50% or more time was spent in counseling patient and family regarding  prognosis and options of treatment and available resources Patient expressed understanding  and was in agreement with this plan. He also understands that He can call clinic at any time with any questions, concerns, or complaints.    Bladder cancer   Staging form: Urinary Bladder, AJCC 7th Edition     Clinical: Stage II (T2, N0, M0) - Signed by Forest Gleason, MD on 11/15/2014   Forest Gleason, MD   11/24/2014 8:48 AM

## 2014-11-28 ENCOUNTER — Ambulatory Visit
Admission: RE | Admit: 2014-11-28 | Discharge: 2014-11-28 | Disposition: A | Discharge status: Home / Self Care | Payer: Medicare Other | Source: Ambulatory Visit | Attending: Radiation Oncology | Admitting: Radiation Oncology

## 2014-11-28 DIAGNOSIS — Z510 Encounter for antineoplastic radiation therapy: Secondary | ICD-10-CM | POA: Diagnosis present

## 2014-11-28 DIAGNOSIS — C679 Malignant neoplasm of bladder, unspecified: Secondary | ICD-10-CM | POA: Diagnosis not present

## 2014-11-28 NOTE — Patient Instructions (Signed)
Cisplatin injection  What is this medicine?  CISPLATIN (SIS pla tin) is a chemotherapy drug. It targets fast dividing cells, like cancer cells, and causes these cells to die. This medicine is used to treat many types of cancer like bladder, ovarian, and testicular cancers.  This medicine may be used for other purposes; ask your health care provider or pharmacist if you have questions.  COMMON BRAND NAME(S): Platinol, Platinol -AQ  What should I tell my health care provider before I take this medicine?  They need to know if you have any of these conditions:  -blood disorders  -hearing problems  -kidney disease  -recent or ongoing radiation therapy  -an unusual or allergic reaction to cisplatin, carboplatin, other chemotherapy, other medicines, foods, dyes, or preservatives  -pregnant or trying to get pregnant  -breast-feeding  How should I use this medicine?  This drug is given as an infusion into a vein. It is administered in a hospital or clinic by a specially trained health care professional.  Talk to your pediatrician regarding the use of this medicine in children. Special care may be needed.  Overdosage: If you think you have taken too much of this medicine contact a poison control center or emergency room at once.  NOTE: This medicine is only for you. Do not share this medicine with others.  What if I miss a dose?  It is important not to miss a dose. Call your doctor or health care professional if you are unable to keep an appointment.  What may interact with this medicine?  -dofetilide  -foscarnet  -medicines for seizures  -medicines to increase blood counts like filgrastim, pegfilgrastim, sargramostim  -probenecid  -pyridoxine used with altretamine  -rituximab  -some antibiotics like amikacin, gentamicin, neomycin, polymyxin B, streptomycin, tobramycin  -sulfinpyrazone  -vaccines  -zalcitabine  Talk to your doctor or health care professional before taking any of these  medicines:  -acetaminophen  -aspirin  -ibuprofen  -ketoprofen  -naproxen  This list may not describe all possible interactions. Give your health care provider a list of all the medicines, herbs, non-prescription drugs, or dietary supplements you use. Also tell them if you smoke, drink alcohol, or use illegal drugs. Some items may interact with your medicine.  What should I watch for while using this medicine?  Your condition will be monitored carefully while you are receiving this medicine. You will need important blood work done while you are taking this medicine.  This drug may make you feel generally unwell. This is not uncommon, as chemotherapy can affect healthy cells as well as cancer cells. Report any side effects. Continue your course of treatment even though you feel ill unless your doctor tells you to stop.  In some cases, you may be given additional medicines to help with side effects. Follow all directions for their use.  Call your doctor or health care professional for advice if you get a fever, chills or sore throat, or other symptoms of a cold or flu. Do not treat yourself. This drug decreases your body's ability to fight infections. Try to avoid being around people who are sick.  This medicine may increase your risk to bruise or bleed. Call your doctor or health care professional if you notice any unusual bleeding.  Be careful brushing and flossing your teeth or using a toothpick because you may get an infection or bleed more easily. If you have any dental work done, tell your dentist you are receiving this medicine.  Avoid taking products   that contain aspirin, acetaminophen, ibuprofen, naproxen, or ketoprofen unless instructed by your doctor. These medicines may hide a fever.  Do not become pregnant while taking this medicine. Women should inform their doctor if they wish to become pregnant or think they might be pregnant. There is a potential for serious side effects to an unborn child. Talk to  your health care professional or pharmacist for more information. Do not breast-feed an infant while taking this medicine.  Drink fluids as directed while you are taking this medicine. This will help protect your kidneys.  Call your doctor or health care professional if you get diarrhea. Do not treat yourself.  What side effects may I notice from receiving this medicine?  Side effects that you should report to your doctor or health care professional as soon as possible:  -allergic reactions like skin rash, itching or hives, swelling of the face, lips, or tongue  -signs of infection - fever or chills, cough, sore throat, pain or difficulty passing urine  -signs of decreased platelets or bleeding - bruising, pinpoint red spots on the skin, black, tarry stools, nosebleeds  -signs of decreased red blood cells - unusually weak or tired, fainting spells, lightheadedness  -breathing problems  -changes in hearing  -gout pain  -low blood counts - This drug may decrease the number of white blood cells, red blood cells and platelets. You may be at increased risk for infections and bleeding.  -nausea and vomiting  -pain, swelling, redness or irritation at the injection site  -pain, tingling, numbness in the hands or feet  -problems with balance, movement  -trouble passing urine or change in the amount of urine  Side effects that usually do not require medical attention (report to your doctor or health care professional if they continue or are bothersome):  -changes in vision  -loss of appetite  -metallic taste in the mouth or changes in taste  This list may not describe all possible side effects. Call your doctor for medical advice about side effects. You may report side effects to FDA at 1-800-FDA-1088.  Where should I keep my medicine?  This drug is given in a hospital or clinic and will not be stored at home.  NOTE: This sheet is a summary. It may not cover all possible information. If you have questions about this medicine,  talk to your doctor, pharmacist, or health care provider.   2015, Elsevier/Gold Standard. (2007-09-14 14:40:54)

## 2014-11-30 ENCOUNTER — Ambulatory Visit: Payer: Medicare Other

## 2014-11-30 DIAGNOSIS — Z510 Encounter for antineoplastic radiation therapy: Secondary | ICD-10-CM | POA: Diagnosis not present

## 2014-12-05 ENCOUNTER — Ambulatory Visit
Admission: RE | Admit: 2014-12-05 | Discharge: 2014-12-05 | Disposition: A | Discharge status: Home / Self Care | Payer: Medicare Other | Source: Ambulatory Visit | Attending: Radiation Oncology | Admitting: Radiation Oncology

## 2014-12-05 DIAGNOSIS — Z510 Encounter for antineoplastic radiation therapy: Secondary | ICD-10-CM | POA: Diagnosis not present

## 2014-12-06 ENCOUNTER — Ambulatory Visit
Admission: RE | Admit: 2014-12-06 | Discharge: 2014-12-06 | Disposition: A | Discharge status: Home / Self Care | Payer: Medicare Other | Source: Ambulatory Visit | Attending: Radiation Oncology | Admitting: Radiation Oncology

## 2014-12-06 DIAGNOSIS — Z510 Encounter for antineoplastic radiation therapy: Secondary | ICD-10-CM | POA: Diagnosis not present

## 2014-12-07 ENCOUNTER — Other Ambulatory Visit: Payer: Self-pay | Admitting: *Deleted

## 2014-12-07 ENCOUNTER — Ambulatory Visit
Admission: RE | Admit: 2014-12-07 | Discharge: 2014-12-07 | Disposition: A | Discharge status: Home / Self Care | Payer: Medicare Other | Source: Ambulatory Visit | Attending: Radiation Oncology | Admitting: Radiation Oncology

## 2014-12-07 DIAGNOSIS — Z510 Encounter for antineoplastic radiation therapy: Secondary | ICD-10-CM | POA: Diagnosis not present

## 2014-12-07 DIAGNOSIS — C674 Malignant neoplasm of posterior wall of bladder: Secondary | ICD-10-CM

## 2014-12-07 MED ORDER — ONDANSETRON HCL 8 MG PO TABS: 8.0000 mg | ORAL_TABLET | Freq: Two times a day (BID) | ORAL | Status: DC | PRN

## 2014-12-08 ENCOUNTER — Ambulatory Visit
Admission: RE | Admit: 2014-12-08 | Discharge: 2014-12-08 | Disposition: A | Discharge status: Home / Self Care | Payer: Medicare Other | Source: Ambulatory Visit | Attending: Radiation Oncology | Admitting: Radiation Oncology

## 2014-12-08 DIAGNOSIS — Z510 Encounter for antineoplastic radiation therapy: Secondary | ICD-10-CM | POA: Diagnosis not present

## 2014-12-11 ENCOUNTER — Inpatient Hospital Stay: Payer: Medicare Other

## 2014-12-11 ENCOUNTER — Inpatient Hospital Stay (HOSPITAL_BASED_OUTPATIENT_CLINIC_OR_DEPARTMENT_OTHER): Payer: Medicare Other | Admitting: Oncology

## 2014-12-11 ENCOUNTER — Ambulatory Visit
Admission: RE | Admit: 2014-12-11 | Discharge: 2014-12-11 | Disposition: A | Discharge status: Home / Self Care | Payer: Medicare Other | Source: Ambulatory Visit | Attending: Radiation Oncology | Admitting: Radiation Oncology

## 2014-12-11 VITALS — BP 133/89 | HR 67 | Temp 96.2°F | Wt 182.3 lb

## 2014-12-11 DIAGNOSIS — G4730 Sleep apnea, unspecified: Secondary | ICD-10-CM

## 2014-12-11 DIAGNOSIS — R531 Weakness: Secondary | ICD-10-CM | POA: Diagnosis not present

## 2014-12-11 DIAGNOSIS — C674 Malignant neoplasm of posterior wall of bladder: Secondary | ICD-10-CM | POA: Diagnosis not present

## 2014-12-11 DIAGNOSIS — I10 Essential (primary) hypertension: Secondary | ICD-10-CM

## 2014-12-11 DIAGNOSIS — M419 Scoliosis, unspecified: Secondary | ICD-10-CM

## 2014-12-11 DIAGNOSIS — M129 Arthropathy, unspecified: Secondary | ICD-10-CM

## 2014-12-11 DIAGNOSIS — R310 Gross hematuria: Secondary | ICD-10-CM

## 2014-12-11 DIAGNOSIS — E538 Deficiency of other specified B group vitamins: Secondary | ICD-10-CM

## 2014-12-11 DIAGNOSIS — E119 Type 2 diabetes mellitus without complications: Secondary | ICD-10-CM

## 2014-12-11 DIAGNOSIS — Z87891 Personal history of nicotine dependence: Secondary | ICD-10-CM

## 2014-12-11 DIAGNOSIS — Z510 Encounter for antineoplastic radiation therapy: Secondary | ICD-10-CM | POA: Diagnosis not present

## 2014-12-11 DIAGNOSIS — R001 Bradycardia, unspecified: Secondary | ICD-10-CM

## 2014-12-11 LAB — CBC WITH DIFFERENTIAL/PLATELET
BASOS PCT: 1 %
Basophils Absolute: 0.1 10*3/uL (ref 0–0.1)
EOS ABS: 0.2 10*3/uL (ref 0–0.7)
Eosinophils Relative: 2 %
HCT: 46.1 % (ref 40.0–52.0)
HEMOGLOBIN: 15.2 g/dL (ref 13.0–18.0)
LYMPHS ABS: 1.5 10*3/uL (ref 1.0–3.6)
LYMPHS PCT: 18 %
MCH: 30.3 pg (ref 26.0–34.0)
MCHC: 32.9 g/dL (ref 32.0–36.0)
MCV: 92.1 fL (ref 80.0–100.0)
MONOS PCT: 8 %
Monocytes Absolute: 0.7 10*3/uL (ref 0.2–1.0)
Neutro Abs: 5.9 10*3/uL (ref 1.4–6.5)
Neutrophils Relative %: 71 %
PLATELETS: 188 10*3/uL (ref 150–440)
RBC: 5.01 MIL/uL (ref 4.40–5.90)
RDW: 14.8 % — ABNORMAL HIGH (ref 11.5–14.5)
WBC: 8.3 10*3/uL (ref 3.8–10.6)

## 2014-12-11 LAB — COMPREHENSIVE METABOLIC PANEL
ALK PHOS: 65 U/L (ref 38–126)
ALT: 15 U/L — AB (ref 17–63)
AST: 20 U/L (ref 15–41)
Albumin: 3.7 g/dL (ref 3.5–5.0)
Anion gap: 6 (ref 5–15)
BUN: 22 mg/dL — ABNORMAL HIGH (ref 6–20)
CHLORIDE: 104 mmol/L (ref 101–111)
CO2: 30 mmol/L (ref 22–32)
Calcium: 8.7 mg/dL — ABNORMAL LOW (ref 8.9–10.3)
Creatinine, Ser: 0.93 mg/dL (ref 0.61–1.24)
GFR calc Af Amer: >60 mL/min (ref >60)
GLUCOSE: 187 mg/dL — AB (ref 65–99)
POTASSIUM: 4.2 mmol/L (ref 3.5–5.1)
SODIUM: 140 mmol/L (ref 135–145)
TOTAL PROTEIN: 7.1 g/dL (ref 6.5–8.1)
Total Bilirubin: 0.9 mg/dL (ref 0.3–1.2)

## 2014-12-11 MED ORDER — SODIUM CHLORIDE 0.9 % IV SOLN
Freq: Once | INTRAVENOUS | Status: AC
  Administered 2014-12-11: 11:00:00 via INTRAVENOUS
  Filled 2014-12-11: qty 1000

## 2014-12-11 MED ORDER — POTASSIUM CHLORIDE 2 MEQ/ML IV SOLN
Freq: Once | INTRAVENOUS | Status: AC
  Administered 2014-12-11: 11:00:00 via INTRAVENOUS
  Filled 2014-12-11: qty 1000

## 2014-12-11 MED ORDER — FOSAPREPITANT DIMEGLUMINE INJECTION 150 MG
Freq: Once | INTRAVENOUS | Status: AC
  Administered 2014-12-11: 14:00:00 via INTRAVENOUS
  Filled 2014-12-11: qty 5

## 2014-12-11 MED ORDER — SODIUM CHLORIDE 0.9 % IV SOLN
20.0000 mg/m2 | Freq: Once | INTRAVENOUS | Status: AC
  Administered 2014-12-11: 41 mg via INTRAVENOUS
  Filled 2014-12-11: qty 41

## 2014-12-11 MED ORDER — PALONOSETRON HCL INJECTION 0.25 MG/5ML
0.2500 mg | Freq: Once | INTRAVENOUS | Status: AC
  Administered 2014-12-11: 0.25 mg via INTRAVENOUS
  Filled 2014-12-11: qty 5

## 2014-12-11 NOTE — Progress Notes (Signed)
Pt brought updated living will today.  Sent to medical records.  Pt former smoker. Quit 1977.

## 2014-12-12 ENCOUNTER — Ambulatory Visit
Admission: RE | Admit: 2014-12-12 | Discharge: 2014-12-12 | Disposition: A | Discharge status: Home / Self Care | Payer: Medicare Other | Source: Ambulatory Visit | Attending: Radiation Oncology | Admitting: Radiation Oncology

## 2014-12-12 ENCOUNTER — Telehealth: Payer: Self-pay

## 2014-12-12 DIAGNOSIS — Z510 Encounter for antineoplastic radiation therapy: Secondary | ICD-10-CM | POA: Diagnosis not present

## 2014-12-12 NOTE — Telephone Encounter (Signed)
Oncology Nurse Navigator Documentation  Oncology Nurse Navigator Flowsheets 11/14/2014 12/12/2014  Referral date to RadOnc/MedOnc 11/14/2014 -  Navigator Encounter Type Initial MedOnc Telephone  Patient Visit Type Medonc -  Time Spent with Patient 60 15  Needs documentation of all XRT treatments for insurance purposes. Maudie Mercury in XRT notified Oncology Nurse Navigator Documentation  Oncology Nurse Navigator Flowsheets 11/14/2014 12/12/2014  Referral date to RadOnc/MedOnc 11/14/2014 -  Navigator Encounter Type Initial MedOnc Telephone  Patient Visit Type Medonc -  Time Spent with Patient 32 20

## 2014-12-13 ENCOUNTER — Ambulatory Visit
Admission: RE | Admit: 2014-12-13 | Discharge: 2014-12-13 | Disposition: A | Discharge status: Home / Self Care | Payer: Medicare Other | Source: Ambulatory Visit | Attending: Radiation Oncology | Admitting: Radiation Oncology

## 2014-12-13 DIAGNOSIS — Z510 Encounter for antineoplastic radiation therapy: Secondary | ICD-10-CM | POA: Diagnosis not present

## 2014-12-14 ENCOUNTER — Ambulatory Visit
Admission: RE | Admit: 2014-12-14 | Discharge: 2014-12-14 | Disposition: A | Discharge status: Home / Self Care | Payer: Medicare Other | Source: Ambulatory Visit | Attending: Radiation Oncology | Admitting: Radiation Oncology

## 2014-12-14 DIAGNOSIS — Z510 Encounter for antineoplastic radiation therapy: Secondary | ICD-10-CM | POA: Diagnosis not present

## 2014-12-15 ENCOUNTER — Ambulatory Visit
Admission: RE | Admit: 2014-12-15 | Discharge: 2014-12-15 | Disposition: A | Discharge status: Home / Self Care | Payer: Medicare Other | Source: Ambulatory Visit | Attending: Radiation Oncology | Admitting: Radiation Oncology

## 2014-12-15 DIAGNOSIS — Z510 Encounter for antineoplastic radiation therapy: Secondary | ICD-10-CM | POA: Diagnosis not present

## 2014-12-16 ENCOUNTER — Encounter: Payer: Self-pay | Admitting: Oncology

## 2014-12-16 NOTE — Progress Notes (Signed)
[place] @ [facility] [phone]) [phone]  Fax:([phone]   INITIAL CONSULT  Anees Vanecek OB: [birth year]  MR#: [id number]  AOZ#:[id number]  Patient Care Team: Albina Billet, MD as PCP - General (Internal Medicine) Royston Cowper, MD as Consulting Physician (Urology)  CHIEF COMPLAINT:  Chief Complaint  Patient presents with  . Follow-up    VISIT DIAGNOSIS:     ICD-9-CM ICD-10-CM   1. 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  Tumor was muscle invasion with lymphovascular invasion Patient  had a transurethral resection of bladder tumor (Oct 24, 2014) 3.patient will start cis-platinum and radiation therapy from June of 2016       Bladder cancer   10/24/2014 Initial Diagnosis Bladder cancer    Oncology Flowsheet 10/24/2014 12/11/2014  Day, Cycle - Day 1, Cycle 1  CISplatin (PLATINOL) IV - 20 mg/m2  dexamethasone (DECADRON) IV - [ 12 mg ]  fosaprepitant (EMEND) IV - [ 150 mg ]  mitoMYcin (MUTAMYCIN) IV - -  ondansetron (ZOFRAN) IV - -  palonosetron (ALOXI) IV - 0.25 mg    INTERVAL HISTORY: 81 year old gentleman had intermittent gross painless hematuria.  Started 4 weeks ago patient on underwent cystoscopy and CT scan.  Patient was supposed to have cystoscopy in April however felt weak was admitted in the hospital had a cardiac evaluation no significant problem was found.  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  Patient had transurethral resection of bladder tumor and mitomycin was instilled. He was then referred to me for further evaluation and treatment planning. No hematuria.  Family by the side of the patient today. November 22, 2014  patient is here for ongoing evaluation.  Patient is being evaluated by radiation oncologist Radiation treatment had been recommended On current chemotherapy with cis-platinum is being discussed today June 20: 2016 S&S started radiation therapy here to discuss on regarding cis-platinum treatment.  No induction therapy becauseofbasisoveralloldageandmultiplemedicalproblems  REVIEW OF SYSTEMS:   Gen. status: Patient is alert oriented not in any acute distress.  No chills.  No fever.  HEENT: No headache.  Hard of hearing.  No soreness in the mouth. Lungs: No shortness of breath.  Dyspnea on exertion.  No cough.  No hemoptysis. Cardiac: No chest pain.  No palpitation patient does have hypertension Endocrine system: Patient does have diabetes GI: No nausea no vomiting no diarrhea of GU: Hematuria has resolved completely. Musculoskeletal system chronic back pain and joint pains Lower extremitywell being Skin: No rash Psychiatric system no significant problem  As per HPI. Otherwise, a complete review of systems is negatve.  PAST MEDICAL HISTORY: Past Medical History  Diagnosis Date  . Non-insulin dependent type 2 diabetes mellitus   . Arthritis   . Hypertension   . Sleep apnea   . B12 deficiency   . Dysrhythmia     Bradycardia    PAST SURGICAL HISTORY: Past Surgical History  Procedure Laterality Date  . Cyst on spine  1961  . Ear cyst excision    . Transurethral resection of bladder tumor N/A 10/24/2014    Procedure: TRANSURETHRAL RESECTION OF BLADDER TUMOR (TURBT);  Surgeon: Royston Cowper, MD;  Location: [facility]  ORS;  Service: Urology;  Laterality: N/A;    FAMILY HISTORY Family History  Problem Relation Age of Onset  . Diabetes Neg Hx    no significant family history of colon cancer, ovarian cancer, breast cancer Mother died of Alzheimer's disease.  At age 63      ADVANCED DIRECTIVES: Patient does have advanced healthcare directive   HEALTH MAINTENANCE: History    Substance Use Topics  . Smoking status: Former Smoker    Quit date: 06/13/1976  . Smokeless tobacco: Never Used  . Alcohol Use: Yes     Comment: monthly 3 beers since xmas      No Known Allergies  Current Outpatient Prescriptions  Medication Sig Dispense Refill  . docusate sodium (COLACE) 100 MG capsule Take 2 capsules (200 mg total) by mouth 2 (two) times daily. 120 capsule 3  . furosemide (LASIX) 20 MG tablet Take 20 mg by mouth.    . Glucosamine HCl (GLUCOSAMINE PO) Take 1,500 mg by mouth 2 (two) times daily.     Marland Kitchen glyBURIDE (DIABETA) 5 MG tablet Take 5 mg by mouth daily with breakfast.    . hyoscyamine (LEVSIN/SL) 0.125 MG SL tablet Place 1 tablet (0.125 mg total) under the tongue every 4 (four) hours as needed. 1-2 TABS 40 tablet 3  . lovastatin (MEVACOR) 40 MG tablet Take 40 mg by mouth at bedtime.    . metFORMIN (GLUCOPHAGE) 1000 MG tablet Take 500 mg by mouth 2 (two) times daily with a meal.     . Methen-Hyosc-Meth Blue-Na Phos (UROGESIC-BLUE) 81.6 MG TABS Take 1 tablet (81.6 mg total) by mouth every 6 (six) hours as needed. 40 tablet 3  . NUCYNTA 50 MG TABS tablet Take 1 tablet (50 mg total) by mouth every 6 (six) hours as needed. 1 TO 2 TABS Q 6 HOURS PRN PAIN 30 tablet 0  . ondansetron (ZOFRAN) 8 MG tablet Take 1 tablet (8 mg total) by mouth 2 (two) times daily as needed. Start on the third day after chemotherapy. 30 tablet 1  . ramipril (ALTACE) 2.5 MG capsule Take 2.5 mg by mouth daily.    . saw palmetto 500 MG capsule Take 900 mg by mouth daily.    . tamsulosin (FLOMAX) 0.4 MG CAPS capsule     . vitamin B-12 (CYANOCOBALAMIN) 1000 MCG tablet Take 1,000 mcg by mouth daily.    Marland Kitchen levofloxacin (LEVAQUIN) 500 MG tablet Take 1 tablet (500 mg total) by mouth daily. (Patient not taking: Reported on 12/11/2014) 7 tablet 0   No current facility-administered medications for this visit.    OBJECTIVE: PHYSICAL EXAM: No known status: Elderly individual not any acute  distress HEENT: No abnormality detected.  No palpable neck masses.  Thyroid within normal limit Lungs: Emphysematous chest.  No rhonchi no rales Cardiac: Soft systolic murmur.  Regular heart sounds bradycardia Abdomen: Protuberant abdomen.  No ascites.  No palpable masses. Lower extremity no edema Skin: No rash Musculoskeletal system: Kyphoscoliosis Neurological system: Higher functions within normal limit.  Cranial nodes are intact.  No other localizing sign   Filed Vitals:   12/11/14 0957  BP: 133/89  Pulse: 67  Temp: 96.2 F (35.7 C)     Body mass index is 24.72 kg/(m^2).    ECOG FS:1 - Symptomatic but completely ambulatory  LAB RESULTS:  Appointment on 12/11/2014  Component Date Value Ref Range Status  . WBC 12/11/2014 8.3  3.8 - 10.6 K/uL Final  . RBC 12/11/2014 5.01  4.40 - 5.90 MIL/uL  Final  . Hemoglobin 12/11/2014 15.2  13.0 - 18.0 g/dL Final  . HCT 12/11/2014 46.1  40.0 - 52.0 % Final  . MCV 12/11/2014 92.1  80.0 - 100.0 fL Final  . MCH 12/11/2014 30.3  26.0 - 34.0 pg Final  . MCHC 12/11/2014 32.9  32.0 - 36.0 g/dL Final  . RDW 12/11/2014 14.8* 11.5 - 14.5 % Final  . Platelets 12/11/2014 188  150 - 440 K/uL Final  . Neutrophils Relative % 12/11/2014 71   Final  . Neutro Abs 12/11/2014 5.9  1.4 - 6.5 K/uL Final  . Lymphocytes Relative 12/11/2014 18   Final  . Lymphs Abs 12/11/2014 1.5  1.0 - 3.6 K/uL Final  . Monocytes Relative 12/11/2014 8   Final  . Monocytes Absolute 12/11/2014 0.7  0.2 - 1.0 K/uL Final  . Eosinophils Relative 12/11/2014 2   Final  . Eosinophils Absolute 12/11/2014 0.2  0 - 0.7 K/uL Final  . Basophils Relative 12/11/2014 1   Final  . Basophils Absolute 12/11/2014 0.1  0 - 0.1 K/uL Final  . Sodium 12/11/2014 140  135 - 145 mmol/L Final  . Potassium 12/11/2014 4.2  3.5 - 5.1 mmol/L Final  . Chloride 12/11/2014 104  101 - 111 mmol/L Final  . CO2 12/11/2014 30  22 - 32 mmol/L Final  . Glucose, Bld 12/11/2014 187* 65 - 99 mg/dL Final  . BUN  12/11/2014 22* 6 - 20 mg/dL Final  . Creatinine, Ser 12/11/2014 0.93  0.61 - 1.24 mg/dL Final  . Calcium 12/11/2014 8.7* 8.9 - 10.3 mg/dL Final  . Total Protein 12/11/2014 7.1  6.5 - 8.1 g/dL Final  . Albumin 12/11/2014 3.7  3.5 - 5.0 g/dL Final  . AST 12/11/2014 20  15 - 41 U/L Final  . ALT 12/11/2014 15* 17 - 63 U/L Final  . Alkaline Phosphatase 12/11/2014 65  38 - 126 U/L Final  . Total Bilirubin 12/11/2014 0.9  0.3 - 1.2 mg/dL Final  . GFR calc non Af Amer 12/11/2014 >60  >60 mL/min Final  . GFR calc Af Amer 12/11/2014 >60  >60 mL/min Final   Comment: (NOTE) The eGFR has been calculated using the CKD EPI equation. This calculation has not been validated in all clinical situations. eGFR's persistently <60 mL/min signify possible Chronic Kidney Disease.   . Anion gap 12/11/2014 6  5 - 15 Final     STUDIES: No results found.  ASSESSMENT:  Muscle invasive carcinoma bladder History of diabetes History of hypertension  PLAN:  I reviewed CT scan and pathology.  In elderly patient muscle invasive bladder cancer always a challenge in terms of management. Following options have been discussed. 1 standard  option would be neoadjuvant chemotherapy followed by total cystectomy. .At his age cystectomy as well as aggressive neoadjuvant chemotherapy may be difficult to carry out and patient also does not want to go through extensive cystectomy procedure.   2.  Second best option would be concurrent radiation chemotherapy Followed by cystoscopy and hoping that we will be complete resolution of the bladder tumor 3.  Third  option would be observation and follow this patient regularly with cystoscopy and a CT scan. 4.  Patient and family will decide.  Meanwhile patient is seeing a radiation oncologist case also would be discussed in tumor conference.  November 22, 2014 Patient was seen today by radiation oncologist radiation treatment has been recommended Concurrent chemotherapy of cis-platinum  has been discussed and patient is agreeable to it All the  side effects of chemotherapy including myelosuppression, alopecia, nausea vomiting fatigue weakness.  Secondary infection, and   peripheral neuropathy .  Has been discussed in details. Informal consent has been obtained and will be documented by nurses in the chart Intent of chemotherapy   is   Cure. June 20, Patient underwent chemotherapy teaching.  Family had numerous questions which were answered. Initiate chemotherapy Total duration of visit was 45 minutes.  50% or more time was spent in counseling patient and family regarding prognosis and options of treatment and available resources  Bladder cancer   Staging form: Urinary Bladder, AJCC 7th Edition     Clinical: Stage II (T2, N0, M0) - Signed by Forest Gleason, MD on 11/15/2014   Forest Gleason, MD   12/16/2014 8:01 AM

## 2014-12-18 ENCOUNTER — Inpatient Hospital Stay (HOSPITAL_BASED_OUTPATIENT_CLINIC_OR_DEPARTMENT_OTHER): Payer: Medicare Other | Admitting: Oncology

## 2014-12-18 ENCOUNTER — Inpatient Hospital Stay: Payer: Medicare Other

## 2014-12-18 ENCOUNTER — Ambulatory Visit
Admission: RE | Admit: 2014-12-18 | Discharge: 2014-12-18 | Disposition: A | Discharge status: Home / Self Care | Payer: Medicare Other | Source: Ambulatory Visit | Attending: Radiation Oncology | Admitting: Radiation Oncology

## 2014-12-18 VITALS — BP 114/69 | HR 66 | Temp 96.2°F | Wt 181.0 lb

## 2014-12-18 DIAGNOSIS — E119 Type 2 diabetes mellitus without complications: Secondary | ICD-10-CM | POA: Diagnosis not present

## 2014-12-18 DIAGNOSIS — C674 Malignant neoplasm of posterior wall of bladder: Secondary | ICD-10-CM

## 2014-12-18 DIAGNOSIS — I10 Essential (primary) hypertension: Secondary | ICD-10-CM

## 2014-12-18 DIAGNOSIS — M419 Scoliosis, unspecified: Secondary | ICD-10-CM

## 2014-12-18 DIAGNOSIS — R531 Weakness: Secondary | ICD-10-CM | POA: Diagnosis not present

## 2014-12-18 DIAGNOSIS — M129 Arthropathy, unspecified: Secondary | ICD-10-CM

## 2014-12-18 DIAGNOSIS — E538 Deficiency of other specified B group vitamins: Secondary | ICD-10-CM

## 2014-12-18 DIAGNOSIS — Z510 Encounter for antineoplastic radiation therapy: Secondary | ICD-10-CM | POA: Diagnosis not present

## 2014-12-18 DIAGNOSIS — R001 Bradycardia, unspecified: Secondary | ICD-10-CM

## 2014-12-18 DIAGNOSIS — Z87891 Personal history of nicotine dependence: Secondary | ICD-10-CM

## 2014-12-18 DIAGNOSIS — G4730 Sleep apnea, unspecified: Secondary | ICD-10-CM

## 2014-12-18 DIAGNOSIS — R310 Gross hematuria: Secondary | ICD-10-CM | POA: Diagnosis not present

## 2014-12-18 LAB — CBC WITH DIFFERENTIAL/PLATELET
BASOS ABS: 0 10*3/uL (ref 0–0.1)
Basophils Relative: 1 %
EOS ABS: 0.3 10*3/uL (ref 0–0.7)
Eosinophils Relative: 4 %
HEMATOCRIT: 45.8 % (ref 40.0–52.0)
HEMOGLOBIN: 15.1 g/dL (ref 13.0–18.0)
LYMPHS ABS: 1.1 10*3/uL (ref 1.0–3.6)
LYMPHS PCT: 13 %
MCH: 30.8 pg (ref 26.0–34.0)
MCHC: 33 g/dL (ref 32.0–36.0)
MCV: 93.4 fL (ref 80.0–100.0)
MONOS PCT: 9 %
Monocytes Absolute: 0.7 10*3/uL (ref 0.2–1.0)
NEUTROS ABS: 6.3 10*3/uL (ref 1.4–6.5)
NEUTROS PCT: 75 %
Platelets: 140 10*3/uL — ABNORMAL LOW (ref 150–440)
RBC: 4.9 MIL/uL (ref 4.40–5.90)
RDW: 14.7 % — ABNORMAL HIGH (ref 11.5–14.5)
WBC: 8.4 10*3/uL (ref 3.8–10.6)

## 2014-12-18 LAB — MAGNESIUM: Magnesium: 2 mg/dL (ref 1.7–2.4)

## 2014-12-18 LAB — COMPREHENSIVE METABOLIC PANEL
ALT: 13 U/L — ABNORMAL LOW (ref 17–63)
AST: 18 U/L (ref 15–41)
Albumin: 3.6 g/dL (ref 3.5–5.0)
Alkaline Phosphatase: 60 U/L (ref 38–126)
Anion gap: 7 (ref 5–15)
BUN: 19 mg/dL (ref 6–20)
CO2: 29 mmol/L (ref 22–32)
Calcium: 8.6 mg/dL — ABNORMAL LOW (ref 8.9–10.3)
Chloride: 102 mmol/L (ref 101–111)
Creatinine, Ser: 0.85 mg/dL (ref 0.61–1.24)
GFR calc Af Amer: >60 mL/min (ref >60)
GFR calc non Af Amer: >60 mL/min (ref >60)
Glucose, Bld: 195 mg/dL — ABNORMAL HIGH (ref 65–99)
Potassium: 4.1 mmol/L (ref 3.5–5.1)
Sodium: 138 mmol/L (ref 135–145)
Total Bilirubin: 0.5 mg/dL (ref 0.3–1.2)
Total Protein: 7.1 g/dL (ref 6.5–8.1)

## 2014-12-18 MED ORDER — SODIUM CHLORIDE 0.9 % IV SOLN
20.0000 mg/m2 | Freq: Once | INTRAVENOUS | Status: AC
  Administered 2014-12-18: 41 mg via INTRAVENOUS
  Filled 2014-12-18: qty 41

## 2014-12-18 MED ORDER — SODIUM CHLORIDE 0.9 % IV SOLN
Freq: Once | INTRAVENOUS | Status: AC
  Administered 2014-12-18: 14:00:00 via INTRAVENOUS
  Filled 2014-12-18: qty 5

## 2014-12-18 MED ORDER — PALONOSETRON HCL INJECTION 0.25 MG/5ML
0.2500 mg | Freq: Once | INTRAVENOUS | Status: AC
  Administered 2014-12-18: 0.25 mg via INTRAVENOUS
  Filled 2014-12-18: qty 5

## 2014-12-18 MED ORDER — DEXTROSE-NACL 5-0.45 % IV SOLN
Freq: Once | INTRAVENOUS | Status: AC
  Administered 2014-12-18: 11:00:00 via INTRAVENOUS
  Filled 2014-12-18: qty 1000

## 2014-12-18 MED ORDER — HEPARIN SOD (PORK) LOCK FLUSH 100 UNIT/ML IV SOLN: 500.0000 [IU] | Freq: Once | INTRAVENOUS | Status: DC | PRN

## 2014-12-18 MED ORDER — SODIUM CHLORIDE 0.9 % IV SOLN
Freq: Once | INTRAVENOUS | Status: AC
  Administered 2014-12-18: 11:00:00 via INTRAVENOUS
  Filled 2014-12-18: qty 1000

## 2014-12-18 NOTE — Progress Notes (Signed)
Patient does have living will.  Former smoker. 

## 2014-12-19 ENCOUNTER — Ambulatory Visit
Admission: RE | Admit: 2014-12-19 | Discharge: 2014-12-19 | Disposition: A | Discharge status: Home / Self Care | Payer: Medicare Other | Source: Ambulatory Visit | Attending: Radiation Oncology | Admitting: Radiation Oncology

## 2014-12-19 ENCOUNTER — Encounter: Payer: Self-pay | Admitting: Oncology

## 2014-12-19 DIAGNOSIS — Z510 Encounter for antineoplastic radiation therapy: Secondary | ICD-10-CM | POA: Diagnosis not present

## 2014-12-19 NOTE — Progress Notes (Signed)
Oncology Nurse Navigator Documentation  Oncology Nurse Navigator Flowsheets 11/14/2014 12/12/2014  Referral date to RadOnc/MedOnc 11/14/2014 -  Navigator Encounter Type Initial MedOnc Telephone  Patient Visit Type Medonc -  Time Spent with Patient 60 15  Needs documentation of all XRT treatments for insurance purposes. Maudie Mercury in XRT notified Oncology Nurse Navigator Documentation  Oncology Nurse Navigator Flowsheets 11/14/2014 12/12/2014  Referral date to RadOnc/MedOnc 11/14/2014 -  Navigator Encounter Type Initial MedOnc Telephone  Patient Visit Type Medonc -  Time Spent with Patient [place] @ [facility] Telephone:(336) [phone]  Fax:(336) [place] OB: 1926/05/04  MR#: [id number]  YFV#:[id number]  Patient Care Team: Albina Billet, MD as PCP - General (Internal Medicine) Royston Cowper, MD as Consulting Physician (Urology)  CHIEF COMPLAINT:  Chief Complaint  Patient presents with  . Follow-up    VISIT DIAGNOSIS:     ICD-9-CM ICD-10-CM   1. 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  Tumor was muscle invasion with lymphovascular invasion Patient  had a transurethral resection of bladder tumor (Oct 24, 2014) 3.patient will start cis-platinum and radiation therapy from June of 2016       Bladder cancer   10/24/2014 Initial Diagnosis Bladder cancer    Oncology Flowsheet 10/24/2014 12/11/2014 12/18/2014  Day, Cycle - Day 1, Cycle 1 Day 8, Cycle 1  CISplatin (PLATINOL) IV - 20 mg/m2 20 mg/m2  dexamethasone (DECADRON) IV - [ 12 mg ] [ 12 mg ]  fosaprepitant (EMEND) IV - [ 150 mg ] [ 150 mg ]  mitoMYcin (MUTAMYCIN) IV - - -    ondansetron (ZOFRAN) IV - - -  palonosetron (ALOXI) IV - 0.25 mg 0.25 mg    INTERVAL HISTORY: 81 year old gentleman had intermittent gross painless hematuria.  Started 4 weeks ago patient on underwent cystoscopy and CT scan.  Patient was supposed to have cystoscopy in April however felt weak was admitted in the hospital had a cardiac evaluation no significant problem was found.  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  Patient had transurethral resection of bladder tumor and mitomycin was instilled. He was then referred to me for further evaluation and treatment planning. No hematuria.  Family by the side of the patient today. November 22, 2014  patient is here for ongoing evaluation.  Patient is being evaluated by radiation oncologist Radiation treatment had been recommended On current chemotherapy with cis-platinum is being discussed today June 20: 2016 S&S started radiation therapy here to discuss on regarding cis-platinum treatment.  No induction therapy becauseofbasisoveralloldageandmultiplemedicalproblems December 18, 2014 Patient is here for ongoing evaluation and continuation of chemotherapy with cis-platinum.  Has mild hematuria no abdominal pain did not have any nausea vomiting diarrhea.  Continuing radiation therapy. REVIEW OF SYSTEMS:   Gen. status: Patient is alert oriented not in any acute distress.  No chills.  No fever.  HEENT: No headache.  Hard of hearing.  No soreness in the mouth. Lungs: No shortness of breath.  Dyspnea on exertion.  No cough.  No hemoptysis. Cardiac: No chest pain.  No palpitation patient does have hypertension  Endocrine system: Patient does have diabetes GI: No nausea no vomiting no diarrhea of GU: Pink stained urine most likely due to hematuria and no dysuria. Musculoskeletal system chronic back pain and joint pains Lower extremitywell being Skin: No rash Psychiatric system no significant problem  As per HPI.  Otherwise, a complete review of systems is negatve.  PAST MEDICAL HISTORY: Past Medical History  Diagnosis Date  . Non-insulin dependent type 2 diabetes mellitus   . Arthritis   . Hypertension   . Sleep apnea   . B12 deficiency   . Dysrhythmia     Bradycardia    PAST SURGICAL HISTORY: Past Surgical History  Procedure Laterality Date  . Cyst on spine  1961  . Ear cyst excision    . Transurethral resection of bladder tumor N/A 10/24/2014    Procedure: TRANSURETHRAL RESECTION OF BLADDER TUMOR (TURBT);  Surgeon: Royston Cowper, MD;  Location: [facility] ORS;  Service: Urology;  Laterality: N/A;    FAMILY HISTORY Family History  Problem Relation Age of Onset  . Diabetes Neg Hx    no significant family history of colon cancer, ovarian cancer, breast cancer Mother died of Alzheimer's disease.  At age 80      ADVANCED DIRECTIVES: Patient does have advanced healthcare directive   HEALTH MAINTENANCE: History  Substance Use Topics  . Smoking status: Former Smoker    Quit date: 06/13/1976  . Smokeless tobacco: Never Used  . Alcohol Use: Yes     Comment: monthly 3 beers since xmas      No Known Allergies  Current Outpatient Prescriptions  Medication Sig Dispense Refill  . docusate sodium (COLACE) 100 MG capsule Take 2 capsules (200 mg total) by mouth 2 (two) times daily. 120 capsule 3  . furosemide (LASIX) 20 MG tablet Take 20 mg by mouth.    . Glucosamine HCl (GLUCOSAMINE PO) Take 1,500 mg by mouth 2 (two) times daily.     Marland Kitchen glyBURIDE (DIABETA) 5 MG tablet Take 5 mg by mouth daily with breakfast.    . hyoscyamine (LEVSIN/SL) 0.125 MG SL tablet Place 1 tablet (0.125 mg total) under the tongue every 4 (four) hours as needed. 1-2 TABS 40 tablet 3  . levofloxacin (LEVAQUIN) 500 MG tablet Take 1 tablet (500 mg total) by mouth daily. 7 tablet 0  . lovastatin (MEVACOR) 40 MG tablet Take 40 mg by mouth at bedtime.    . metFORMIN (GLUCOPHAGE) 1000 MG tablet Take 500 mg by mouth 2 (two)  times daily with a meal.     . ondansetron (ZOFRAN) 8 MG tablet Take 1 tablet (8 mg total) by mouth 2 (two) times daily as needed. Start on the third day after chemotherapy. 30 tablet 1  . ramipril (ALTACE) 2.5 MG capsule Take 2.5 mg by mouth daily.    . saw palmetto 500 MG capsule Take 900 mg by mouth daily.    . tamsulosin (FLOMAX) 0.4 MG CAPS capsule     . vitamin B-12 (CYANOCOBALAMIN) 1000 MCG tablet Take 1,000 mcg by mouth daily.    . metFORMIN (GLUCOPHAGE) 500 MG tablet     . Methen-Hyosc-Meth Blue-Na Phos (UROGESIC-BLUE) 81.6 MG TABS Take 1 tablet (81.6 mg total) by mouth every 6 (six) hours as needed. (Patient not taking: Reported on 12/18/2014) 40 tablet 3  . NUCYNTA 50 MG TABS tablet Take 1 tablet (50 mg total) by mouth every 6 (six) hours as needed. 1 TO 2 TABS Q 6 HOURS PRN PAIN (Patient not taking: Reported  on 12/18/2014) 30 tablet 0   No current facility-administered medications for this visit.    OBJECTIVE: PHYSICAL EXAM: No known status: Elderly individual not any acute distress HEENT: No abnormality detected.  No palpable neck masses.  Thyroid within normal limit Lungs: Emphysematous chest.  No rhonchi no rales Cardiac: Soft systolic murmur.  Regular heart sounds bradycardia Abdomen: Protuberant abdomen.  No ascites.  No palpable masses. Lower extremity no edema Skin: No rash Musculoskeletal system: Kyphoscoliosis Neurological system: Higher functions within normal limit.  Cranial nodes are intact.  No other localizing sign   Filed Vitals:   12/18/14 0953  BP: 114/69  Pulse: 66  Temp: 96.2 F (35.7 C)     Body mass index is 24.54 kg/(m^2).    ECOG FS:1 - Symptomatic but completely ambulatory  LAB RESULTS:  Appointment on 12/18/2014  Component Date Value Ref Range Status  . WBC 12/18/2014 8.4  3.8 - 10.6 K/uL Final  . RBC 12/18/2014 4.90  4.40 - 5.90 MIL/uL Final  . Hemoglobin 12/18/2014 15.1  13.0 - 18.0 g/dL Final  . HCT 12/18/2014 45.8  40.0 - 52.0 % Final    . MCV 12/18/2014 93.4  80.0 - 100.0 fL Final  . MCH 12/18/2014 30.8  26.0 - 34.0 pg Final  . MCHC 12/18/2014 33.0  32.0 - 36.0 g/dL Final  . RDW 12/18/2014 14.7* 11.5 - 14.5 % Final  . Platelets 12/18/2014 140* 150 - 440 K/uL Final  . Neutrophils Relative % 12/18/2014 75   Final  . Neutro Abs 12/18/2014 6.3  1.4 - 6.5 K/uL Final  . Lymphocytes Relative 12/18/2014 13   Final  . Lymphs Abs 12/18/2014 1.1  1.0 - 3.6 K/uL Final  . Monocytes Relative 12/18/2014 9   Final  . Monocytes Absolute 12/18/2014 0.7  0.2 - 1.0 K/uL Final  . Eosinophils Relative 12/18/2014 4   Final  . Eosinophils Absolute 12/18/2014 0.3  0 - 0.7 K/uL Final  . Basophils Relative 12/18/2014 1   Final  . Basophils Absolute 12/18/2014 0.0  0 - 0.1 K/uL Final  . Sodium 12/18/2014 138  135 - 145 mmol/L Final  . Potassium 12/18/2014 4.1  3.5 - 5.1 mmol/L Final  . Chloride 12/18/2014 102  101 - 111 mmol/L Final  . CO2 12/18/2014 29  22 - 32 mmol/L Final  . Glucose, Bld 12/18/2014 195* 65 - 99 mg/dL Final  . BUN 12/18/2014 19  6 - 20 mg/dL Final  . Creatinine, Ser 12/18/2014 0.85  0.61 - 1.24 mg/dL Final  . Calcium 12/18/2014 8.6* 8.9 - 10.3 mg/dL Final  . Total Protein 12/18/2014 7.1  6.5 - 8.1 g/dL Final  . Albumin 12/18/2014 3.6  3.5 - 5.0 g/dL Final  . AST 12/18/2014 18  15 - 41 U/L Final  . ALT 12/18/2014 13* 17 - 63 U/L Final  . Alkaline Phosphatase 12/18/2014 60  38 - 126 U/L Final  . Total Bilirubin 12/18/2014 0.5  0.3 - 1.2 mg/dL Final  . GFR calc non Af Amer 12/18/2014 >60  >60 mL/min Final  . GFR calc Af Amer 12/18/2014 >60  >60 mL/min Final   Comment: (NOTE) The eGFR has been calculated using the CKD EPI equation. This calculation has not been validated in all clinical situations. eGFR's persistently <60 mL/min signify possible Chronic Kidney Disease.   . Anion gap 12/18/2014 7  5 - 15 Final  . Magnesium 12/18/2014 2.0  1.7 - 2.4 mg/dL Final     STUDIES: No results  found.  ASSESSMENT:  Muscle  invasive carcinoma bladder History of diabetes History of hypertension  PLAN:  All lab data has been reviewed.  Patient is continuing radiation therapy will get weekly cis-platinum 2 and will be evaluated in third week.  Overall tolerance so far is fairly good.    Bladder cancer   Staging form: Urinary Bladder, AJCC 7th Edition     Clinical: Stage II (T2, N0, M0) - Signed by Forest Gleason, MD on 11/15/2014   Forest Gleason, MD   12/19/2014 8:11 AM

## 2014-12-20 ENCOUNTER — Ambulatory Visit
Admission: RE | Admit: 2014-12-20 | Discharge: 2014-12-20 | Disposition: A | Discharge status: Home / Self Care | Payer: Medicare Other | Source: Ambulatory Visit | Attending: Radiation Oncology | Admitting: Radiation Oncology

## 2014-12-20 DIAGNOSIS — Z510 Encounter for antineoplastic radiation therapy: Secondary | ICD-10-CM | POA: Diagnosis not present

## 2014-12-21 ENCOUNTER — Ambulatory Visit
Admission: RE | Admit: 2014-12-21 | Discharge: 2014-12-21 | Disposition: A | Discharge status: Home / Self Care | Payer: Medicare Other | Source: Ambulatory Visit | Attending: Radiation Oncology | Admitting: Radiation Oncology

## 2014-12-21 DIAGNOSIS — Z510 Encounter for antineoplastic radiation therapy: Secondary | ICD-10-CM | POA: Diagnosis not present

## 2014-12-22 ENCOUNTER — Ambulatory Visit
Admission: RE | Admit: 2014-12-22 | Discharge: 2014-12-22 | Disposition: A | Discharge status: Home / Self Care | Payer: Medicare Other | Source: Ambulatory Visit | Attending: Radiation Oncology | Admitting: Radiation Oncology

## 2014-12-22 DIAGNOSIS — Z510 Encounter for antineoplastic radiation therapy: Secondary | ICD-10-CM | POA: Diagnosis not present

## 2014-12-26 ENCOUNTER — Inpatient Hospital Stay
Admission: RE | Admit: 2014-12-26 | Discharge: 2014-12-26 | Disposition: A | Discharge status: Home / Self Care | Payer: Self-pay | Source: Ambulatory Visit | Attending: Radiation Oncology | Admitting: Radiation Oncology

## 2014-12-26 ENCOUNTER — Ambulatory Visit
Admission: RE | Admit: 2014-12-26 | Discharge: 2014-12-26 | Disposition: A | Discharge status: Home / Self Care | Payer: Medicare Other | Source: Ambulatory Visit | Attending: Radiation Oncology | Admitting: Radiation Oncology

## 2014-12-26 DIAGNOSIS — Z510 Encounter for antineoplastic radiation therapy: Secondary | ICD-10-CM | POA: Diagnosis not present

## 2014-12-27 ENCOUNTER — Ambulatory Visit
Admission: RE | Admit: 2014-12-27 | Discharge: 2014-12-27 | Disposition: A | Discharge status: Home / Self Care | Payer: Medicare Other | Source: Ambulatory Visit | Attending: Radiation Oncology | Admitting: Radiation Oncology

## 2014-12-27 ENCOUNTER — Inpatient Hospital Stay: Payer: Medicare Other | Attending: Oncology

## 2014-12-27 ENCOUNTER — Other Ambulatory Visit: Payer: Medicare Other

## 2014-12-27 ENCOUNTER — Inpatient Hospital Stay: Payer: Medicare Other

## 2014-12-27 DIAGNOSIS — R310 Gross hematuria: Secondary | ICD-10-CM | POA: Insufficient documentation

## 2014-12-27 DIAGNOSIS — C674 Malignant neoplasm of posterior wall of bladder: Secondary | ICD-10-CM | POA: Diagnosis not present

## 2014-12-27 DIAGNOSIS — E039 Hypothyroidism, unspecified: Secondary | ICD-10-CM | POA: Insufficient documentation

## 2014-12-27 DIAGNOSIS — Z79899 Other long term (current) drug therapy: Secondary | ICD-10-CM | POA: Diagnosis not present

## 2014-12-27 DIAGNOSIS — I10 Essential (primary) hypertension: Secondary | ICD-10-CM | POA: Diagnosis not present

## 2014-12-27 DIAGNOSIS — G4730 Sleep apnea, unspecified: Secondary | ICD-10-CM | POA: Diagnosis not present

## 2014-12-27 DIAGNOSIS — I499 Cardiac arrhythmia, unspecified: Secondary | ICD-10-CM | POA: Insufficient documentation

## 2014-12-27 DIAGNOSIS — E119 Type 2 diabetes mellitus without complications: Secondary | ICD-10-CM | POA: Diagnosis not present

## 2014-12-27 DIAGNOSIS — Z5111 Encounter for antineoplastic chemotherapy: Secondary | ICD-10-CM | POA: Insufficient documentation

## 2014-12-27 DIAGNOSIS — M129 Arthropathy, unspecified: Secondary | ICD-10-CM | POA: Diagnosis not present

## 2014-12-27 DIAGNOSIS — Z510 Encounter for antineoplastic radiation therapy: Secondary | ICD-10-CM | POA: Diagnosis not present

## 2014-12-27 LAB — COMPREHENSIVE METABOLIC PANEL
ALBUMIN: 3.7 g/dL (ref 3.5–5.0)
ALK PHOS: 61 U/L (ref 38–126)
ALT: 21 U/L (ref 17–63)
AST: 25 U/L (ref 15–41)
Anion gap: 7 (ref 5–15)
BUN: 16 mg/dL (ref 6–20)
CHLORIDE: 103 mmol/L (ref 101–111)
CO2: 28 mmol/L (ref 22–32)
Calcium: 8.4 mg/dL — ABNORMAL LOW (ref 8.9–10.3)
Creatinine, Ser: 0.99 mg/dL (ref 0.61–1.24)
GFR calc non Af Amer: >60 mL/min (ref >60)
Glucose, Bld: 209 mg/dL — ABNORMAL HIGH (ref 65–99)
Potassium: 4.1 mmol/L (ref 3.5–5.1)
SODIUM: 138 mmol/L (ref 135–145)
TOTAL PROTEIN: 6.7 g/dL (ref 6.5–8.1)
Total Bilirubin: 0.8 mg/dL (ref 0.3–1.2)

## 2014-12-27 LAB — CBC WITH DIFFERENTIAL/PLATELET
BASOS ABS: 0 10*3/uL (ref 0–0.1)
Basophils Relative: 0 %
EOS ABS: 0.3 10*3/uL (ref 0–0.7)
Eosinophils Relative: 4 %
HCT: 43.5 % (ref 40.0–52.0)
Hemoglobin: 14.5 g/dL (ref 13.0–18.0)
Lymphocytes Relative: 9 %
Lymphs Abs: 0.6 10*3/uL — ABNORMAL LOW (ref 1.0–3.6)
MCH: 31 pg (ref 26.0–34.0)
MCHC: 33.4 g/dL (ref 32.0–36.0)
MCV: 92.9 fL (ref 80.0–100.0)
Monocytes Absolute: 0.7 10*3/uL (ref 0.2–1.0)
Monocytes Relative: 12 %
NEUTROS ABS: 4.9 10*3/uL (ref 1.4–6.5)
NEUTROS PCT: 75 %
PLATELETS: 124 10*3/uL — AB (ref 150–440)
RBC: 4.68 MIL/uL (ref 4.40–5.90)
RDW: 14.4 % (ref 11.5–14.5)
WBC: 6.5 10*3/uL (ref 3.8–10.6)

## 2014-12-27 LAB — MAGNESIUM: Magnesium: 2 mg/dL (ref 1.7–2.4)

## 2014-12-27 MED ORDER — POTASSIUM CHLORIDE 2 MEQ/ML IV SOLN
Freq: Once | INTRAVENOUS | Status: AC
  Administered 2014-12-27: 10:00:00 via INTRAVENOUS
  Filled 2014-12-27: qty 1000

## 2014-12-27 MED ORDER — PALONOSETRON HCL INJECTION 0.25 MG/5ML
0.2500 mg | Freq: Once | INTRAVENOUS | Status: AC
  Administered 2014-12-27: 0.25 mg via INTRAVENOUS
  Filled 2014-12-27: qty 5

## 2014-12-27 MED ORDER — FOSAPREPITANT DIMEGLUMINE INJECTION 150 MG
Freq: Once | INTRAVENOUS | Status: AC
  Administered 2014-12-27: 12:00:00 via INTRAVENOUS
  Filled 2014-12-27: qty 5

## 2014-12-27 MED ORDER — CISPLATIN CHEMO INJECTION 100MG/100ML
20.0000 mg/m2 | Freq: Once | INTRAVENOUS | Status: AC
  Administered 2014-12-27: 41 mg via INTRAVENOUS
  Filled 2014-12-27: qty 41

## 2014-12-27 MED ORDER — SODIUM CHLORIDE 0.9 % IV SOLN
Freq: Once | INTRAVENOUS | Status: AC
  Administered 2014-12-27: 10:00:00 via INTRAVENOUS
  Filled 2014-12-27: qty 1000

## 2014-12-28 ENCOUNTER — Ambulatory Visit: Payer: Medicare Other

## 2014-12-28 ENCOUNTER — Other Ambulatory Visit: Payer: Medicare Other

## 2014-12-28 ENCOUNTER — Ambulatory Visit
Admission: RE | Admit: 2014-12-28 | Discharge: 2014-12-28 | Disposition: A | Discharge status: Home / Self Care | Payer: Medicare Other | Source: Ambulatory Visit | Attending: Radiation Oncology | Admitting: Radiation Oncology

## 2014-12-28 DIAGNOSIS — Z510 Encounter for antineoplastic radiation therapy: Secondary | ICD-10-CM | POA: Diagnosis not present

## 2014-12-29 ENCOUNTER — Ambulatory Visit
Admission: RE | Admit: 2014-12-29 | Discharge: 2014-12-29 | Disposition: A | Discharge status: Home / Self Care | Payer: Medicare Other | Source: Ambulatory Visit | Attending: Radiation Oncology | Admitting: Radiation Oncology

## 2014-12-29 ENCOUNTER — Other Ambulatory Visit: Payer: Self-pay | Admitting: *Deleted

## 2014-12-29 DIAGNOSIS — Z510 Encounter for antineoplastic radiation therapy: Secondary | ICD-10-CM | POA: Diagnosis not present

## 2014-12-29 DIAGNOSIS — C674 Malignant neoplasm of posterior wall of bladder: Secondary | ICD-10-CM

## 2015-01-01 ENCOUNTER — Inpatient Hospital Stay: Payer: Medicare Other

## 2015-01-01 ENCOUNTER — Ambulatory Visit
Admission: RE | Admit: 2015-01-01 | Discharge: 2015-01-01 | Disposition: A | Discharge status: Home / Self Care | Payer: Medicare Other | Source: Ambulatory Visit | Attending: Radiation Oncology | Admitting: Radiation Oncology

## 2015-01-01 ENCOUNTER — Encounter: Payer: Self-pay | Admitting: Oncology

## 2015-01-01 ENCOUNTER — Inpatient Hospital Stay (HOSPITAL_BASED_OUTPATIENT_CLINIC_OR_DEPARTMENT_OTHER): Payer: Medicare Other | Admitting: Oncology

## 2015-01-01 VITALS — BP 128/70 | HR 71 | Temp 96.1°F | Wt 181.7 lb

## 2015-01-01 DIAGNOSIS — Z79899 Other long term (current) drug therapy: Secondary | ICD-10-CM

## 2015-01-01 DIAGNOSIS — G4730 Sleep apnea, unspecified: Secondary | ICD-10-CM

## 2015-01-01 DIAGNOSIS — R310 Gross hematuria: Secondary | ICD-10-CM

## 2015-01-01 DIAGNOSIS — Z510 Encounter for antineoplastic radiation therapy: Secondary | ICD-10-CM | POA: Diagnosis not present

## 2015-01-01 DIAGNOSIS — C674 Malignant neoplasm of posterior wall of bladder: Secondary | ICD-10-CM

## 2015-01-01 DIAGNOSIS — M129 Arthropathy, unspecified: Secondary | ICD-10-CM

## 2015-01-01 DIAGNOSIS — E039 Hypothyroidism, unspecified: Secondary | ICD-10-CM

## 2015-01-01 DIAGNOSIS — I499 Cardiac arrhythmia, unspecified: Secondary | ICD-10-CM

## 2015-01-01 DIAGNOSIS — E119 Type 2 diabetes mellitus without complications: Secondary | ICD-10-CM

## 2015-01-01 DIAGNOSIS — Z5111 Encounter for antineoplastic chemotherapy: Secondary | ICD-10-CM | POA: Diagnosis not present

## 2015-01-01 DIAGNOSIS — I10 Essential (primary) hypertension: Secondary | ICD-10-CM | POA: Diagnosis not present

## 2015-01-01 LAB — CBC WITH DIFFERENTIAL/PLATELET
BASOS PCT: 1 %
Basophils Absolute: 0 10*3/uL (ref 0–0.1)
Eosinophils Absolute: 0.2 10*3/uL (ref 0–0.7)
Eosinophils Relative: 3 %
HCT: 43.2 % (ref 40.0–52.0)
HEMOGLOBIN: 14.2 g/dL (ref 13.0–18.0)
LYMPHS ABS: 0.5 10*3/uL — AB (ref 1.0–3.6)
Lymphocytes Relative: 9 %
MCH: 31.2 pg (ref 26.0–34.0)
MCHC: 32.9 g/dL (ref 32.0–36.0)
MCV: 94.6 fL (ref 80.0–100.0)
Monocytes Absolute: 0.4 10*3/uL (ref 0.2–1.0)
Monocytes Relative: 8 %
NEUTROS ABS: 4.2 10*3/uL (ref 1.4–6.5)
NEUTROS PCT: 79 %
Platelets: 148 10*3/uL — ABNORMAL LOW (ref 150–440)
RBC: 4.57 MIL/uL (ref 4.40–5.90)
RDW: 15.3 % — ABNORMAL HIGH (ref 11.5–14.5)
WBC: 5.3 10*3/uL (ref 3.8–10.6)

## 2015-01-01 LAB — COMPREHENSIVE METABOLIC PANEL
ALT: 15 U/L — ABNORMAL LOW (ref 17–63)
ANION GAP: 7 (ref 5–15)
AST: 19 U/L (ref 15–41)
Albumin: 3.6 g/dL (ref 3.5–5.0)
Alkaline Phosphatase: 68 U/L (ref 38–126)
BUN: 18 mg/dL (ref 6–20)
CALCIUM: 8.3 mg/dL — AB (ref 8.9–10.3)
CHLORIDE: 98 mmol/L — AB (ref 101–111)
CO2: 30 mmol/L (ref 22–32)
Creatinine, Ser: 1.04 mg/dL (ref 0.61–1.24)
GFR calc Af Amer: >60 mL/min (ref >60)
Glucose, Bld: 296 mg/dL — ABNORMAL HIGH (ref 65–99)
Potassium: 4.2 mmol/L (ref 3.5–5.1)
SODIUM: 135 mmol/L (ref 135–145)
Total Bilirubin: 0.6 mg/dL (ref 0.3–1.2)
Total Protein: 6.6 g/dL (ref 6.5–8.1)

## 2015-01-01 LAB — MAGNESIUM: Magnesium: 2 mg/dL (ref 1.7–2.4)

## 2015-01-01 MED ORDER — SODIUM CHLORIDE 0.9 % IV SOLN
20.0000 mg/m2 | Freq: Once | INTRAVENOUS | Status: AC
  Administered 2015-01-01: 41 mg via INTRAVENOUS
  Filled 2015-01-01: qty 41

## 2015-01-01 MED ORDER — SODIUM CHLORIDE 0.9 % IV SOLN
Freq: Once | INTRAVENOUS | Status: AC
  Administered 2015-01-01: 10:00:00 via INTRAVENOUS
  Filled 2015-01-01: qty 1000

## 2015-01-01 MED ORDER — PALONOSETRON HCL INJECTION 0.25 MG/5ML
0.2500 mg | Freq: Once | INTRAVENOUS | Status: AC
  Administered 2015-01-01: 0.25 mg via INTRAVENOUS
  Filled 2015-01-01: qty 5

## 2015-01-01 MED ORDER — SODIUM CHLORIDE 0.9 % IV SOLN
Freq: Once | INTRAVENOUS | Status: AC
  Administered 2015-01-01: 12:00:00 via INTRAVENOUS
  Filled 2015-01-01: qty 5

## 2015-01-01 MED ORDER — POTASSIUM CHLORIDE 2 MEQ/ML IV SOLN
Freq: Once | INTRAVENOUS | Status: AC
  Administered 2015-01-01: 10:00:00 via INTRAVENOUS
  Filled 2015-01-01: qty 1000

## 2015-01-01 NOTE — Progress Notes (Signed)
Patient does have living will.  Former smoker. 

## 2015-01-01 NOTE — Progress Notes (Signed)
Oncology Nurse Navigator Documentation  Oncology Nurse Navigator Flowsheets 11/14/2014 12/12/2014  Referral date to RadOnc/MedOnc 11/14/2014 -  Navigator Encounter Type Initial MedOnc Telephone  Patient Visit Type Medonc -  Time Spent with Patient (Retired) 61 15  Needs documentation of all XRT treatments for insurance purposes. Herbert Phillips in XRT notified Oncology Nurse Navigator Documentation  Oncology Nurse Navigator Flowsheets 11/14/2014 12/12/2014  Referral date to RadOnc/MedOnc 11/14/2014 -  Navigator Encounter Type Initial MedOnc Telephone  Patient Visit Type Medonc -  Time Spent with Patient (Retired) [place] @ [place] [phone]) [phone]  Fax:(336) [place] OB: Nov 01, 1925  MR#: [id number]  MOL#:[id number]  Patient Care Team: Albina Billet, MD as PCP - General (Internal Medicine) Royston Cowper, MD as Consulting Physician (Urology)  CHIEF COMPLAINT:  Chief Complaint  Patient presents with  . Follow-up    VISIT DIAGNOSIS:     ICD-9-CM ICD-10-CM   1. 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  Tumor was muscle invasion with lymphovascular invasion Patient  had a transurethral resection of bladder tumor (Oct 24, 2014) 3.patient will start cis-platinum and radiation therapy from June of 2016       Bladder cancer   10/24/2014 Initial Diagnosis Bladder cancer    Oncology Flowsheet 10/24/2014 12/11/2014 12/18/2014 12/27/2014  Day, Cycle - Day 1, Cycle 1 Day 8, Cycle 1 Day 1, Cycle 2  CISplatin (PLATINOL) IV - 20 mg/m2 20 mg/m2 20 mg/m2    dexamethasone (DECADRON) IV - [ 12 mg ] [ 12 mg ] [ 12 mg ]  fosaprepitant (EMEND) IV - [ 150 mg ] [ 150 mg ] [ 150 mg ]  mitoMYcin (MUTAMYCIN) IV - - - -  ondansetron (ZOFRAN) IV - - - -  palonosetron (ALOXI) IV - 0.25 mg 0.25 mg 0.25 mg    INTERVAL HISTORY: 81 year old gentleman had intermittent gross painless hematuria.  Started 4 weeks ago patient on underwent cystoscopy and CT scan.  Patient was supposed to have cystoscopy in April however felt weak was admitted in the hospital had a cardiac evaluation no significant problem was found.  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  Patient had transurethral resection of bladder tumor and mitomycin was instilled. He was then referred to me for further evaluation and treatment planning. No hematuria.  Family by the side of the patient today. today June 20: 2016 S&S started radiation therapy here to discuss on regarding cis-platinum treatment.  No induction therapy becauseofbasisoveralloldageandmultiplemedicalproblems December 18, 2014 Patient is here for ongoing evaluation and continuation of chemotherapy with cis-platinum.  Has mild hematuria no abdominal pain did not have any nausea vomiting diarrhea.  Continuing radiation therapy. January 01, 2015 Patient is here for fourth cycle of chemotherapy with cis-platinum tolerating treatment very well.  No nausea no vomiting no diarrhea.  Also getting radiation therapy.  No tingling.  No numbness.  Did not have any other major issue with chemotherapy or radiation therapy REVIEW OF SYSTEMS:   Gen.  status: Patient is alert oriented not in any acute distress.  No chills.  No fever.  HEENT: No headache.  Hard of hearing.  No soreness in the mouth. Lungs: No shortness of breath.  Dyspnea on exertion.  No cough.  No hemoptysis. Cardiac: No chest pain.  No palpitation patient does have hypertension Endocrine system: Patient does have diabetes GI: No nausea no vomiting no  diarrhea of GU: No hematuria or dysuria or burning Musculoskeletal system chronic back pain and joint pains Lower extremitywell being Skin: No rash Psychiatric system no significant problem  As per HPI. Otherwise, a complete review of systems is negatve.  PAST MEDICAL HISTORY: Past Medical History  Diagnosis Date  . Non-insulin dependent type 2 diabetes mellitus   . Arthritis   . Hypertension   . Sleep apnea   . B12 deficiency   . Dysrhythmia     Bradycardia    PAST SURGICAL HISTORY: Past Surgical History  Procedure Laterality Date  . Cyst on spine  1961  . Ear cyst excision    . Transurethral resection of bladder tumor N/A 10/24/2014    Procedure: TRANSURETHRAL RESECTION OF BLADDER TUMOR (TURBT);  Surgeon: Royston Cowper, MD;  Location: [facility] ORS;  Service: Urology;  Laterality: N/A;    FAMILY HISTORY Family History  Problem Relation Age of Onset  . Diabetes Neg Hx    no significant family history of colon cancer, ovarian cancer, breast cancer Mother died of Alzheimer's disease.  At age 79      ADVANCED DIRECTIVES: Patient does have advanced healthcare directive   HEALTH MAINTENANCE: History  Substance Use Topics  . Smoking status: Former Smoker    Quit date: 06/13/1976  . Smokeless tobacco: Never Used  . Alcohol Use: Yes     Comment: monthly 3 beers since xmas      No Known Allergies  Current Outpatient Prescriptions  Medication Sig Dispense Refill  . docusate sodium (COLACE) 100 MG capsule Take 2 capsules (200 mg total) by mouth 2 (two) times daily. 120 capsule 3  . furosemide (LASIX) 20 MG tablet Take 20 mg by mouth.    . Glucosamine HCl (GLUCOSAMINE PO) Take 1,500 mg by mouth 2 (two) times daily.     Marland Kitchen glyBURIDE (DIABETA) 5 MG tablet Take 5 mg by mouth daily with breakfast.    . hyoscyamine (LEVSIN/SL) 0.125 MG SL tablet Place 1 tablet (0.125 mg total) under the tongue every 4 (four) hours as needed. 1-2 TABS 40 tablet 3  . levofloxacin (LEVAQUIN)  500 MG tablet Take 1 tablet (500 mg total) by mouth daily. 7 tablet 0  . metFORMIN (GLUCOPHAGE) 1000 MG tablet Take 500 mg by mouth 2 (two) times daily with a meal.     . metFORMIN (GLUCOPHAGE) 500 MG tablet     . Methen-Hyosc-Meth Blue-Na Phos (UROGESIC-BLUE) 81.6 MG TABS Take 1 tablet (81.6 mg total) by mouth every 6 (six) hours as needed. 40 tablet 3  . NUCYNTA 50 MG TABS tablet Take 1 tablet (50 mg total) by mouth every 6 (six) hours as needed. 1 TO 2 TABS Q 6 HOURS PRN PAIN 30 tablet 0  . ondansetron (ZOFRAN) 8 MG tablet Take 1 tablet (8 mg total) by mouth 2 (two) times daily as needed. Start on the third day after chemotherapy. 30 tablet 1  . ramipril (ALTACE) 2.5 MG capsule Take 2.5 mg by mouth daily.    . saw palmetto 500 MG capsule Take 900 mg by mouth daily.    Marland Kitchen  tamsulosin (FLOMAX) 0.4 MG CAPS capsule     . vitamin B-12 (CYANOCOBALAMIN) 1000 MCG tablet Take 1,000 mcg by mouth daily.    Marland Kitchen lovastatin (MEVACOR) 20 MG tablet     . lovastatin (MEVACOR) 40 MG tablet Take 40 mg by mouth at bedtime.     No current facility-administered medications for this visit.   Facility-Administered Medications Ordered in Other Visits  Medication Dose Route Frequency Provider Last Rate Last Dose  . CISplatin (PLATINOL) 41 mg in sodium chloride 0.9 % 250 mL chemo infusion  20 mg/m2 (Treatment Plan Actual) Intravenous Once Forest Gleason, MD      . dextrose 5 % and 0.45% NaCl 1,000 mL with potassium chloride 20 mEq, magnesium sulfate 12 mEq infusion   Intravenous Once Forest Gleason, MD   Stopped at 01/01/15 1215  . fosaprepitant (EMEND) 150 mg, dexamethasone (DECADRON) 12 mg in sodium chloride 0.9 % 145 mL IVPB   Intravenous Once Forest Gleason, MD      . palonosetron (ALOXI) injection 0.25 mg  0.25 mg Intravenous Once Forest Gleason, MD        OBJECTIVE: PHYSICAL EXAM: No known status: Elderly individual not any acute distress HEENT: No abnormality detected.  No palpable neck masses.  Thyroid within normal  limit Lungs: Emphysematous chest.  No rhonchi no rales Cardiac: Soft systolic murmur.  Regular heart sounds bradycardia Abdomen: Protuberant abdomen.  No ascites.  No palpable masses. Lower extremity no edema Skin: No rash Musculoskeletal system: Kyphoscoliosis Neurological system: Higher functions within normal limit.  Cranial nodes are intact.  No other localizing sign Lymphatic system: Supraclavicular, cervical, axillary, inguinal lymph nodes are not palpable   Filed Vitals:   01/01/15 0936  BP: 128/70  Pulse: 71  Temp: 96.1 F (35.6 C)     Body mass index is 24.63 kg/(m^2).    ECOG FS:1 - Symptomatic but completely ambulatory  LAB RESULTS:  Appointment on 01/01/2015  Component Date Value Ref Range Status  . Magnesium 01/01/2015 2.0  1.7 - 2.4 mg/dL Final  . Sodium 01/01/2015 135  135 - 145 mmol/L Final  . Potassium 01/01/2015 4.2  3.5 - 5.1 mmol/L Final  . Chloride 01/01/2015 98* 101 - 111 mmol/L Final  . CO2 01/01/2015 30  22 - 32 mmol/L Final  . Glucose, Bld 01/01/2015 296* 65 - 99 mg/dL Final  . BUN 01/01/2015 18  6 - 20 mg/dL Final  . Creatinine, Ser 01/01/2015 1.04  0.61 - 1.24 mg/dL Final  . Calcium 01/01/2015 8.3* 8.9 - 10.3 mg/dL Final  . Total Protein 01/01/2015 6.6  6.5 - 8.1 g/dL Final  . Albumin 01/01/2015 3.6  3.5 - 5.0 g/dL Final  . AST 01/01/2015 19  15 - 41 U/L Final  . ALT 01/01/2015 15* 17 - 63 U/L Final  . Alkaline Phosphatase 01/01/2015 68  38 - 126 U/L Final  . Total Bilirubin 01/01/2015 0.6  0.3 - 1.2 mg/dL Final  . GFR calc non Af Amer 01/01/2015 >60  >60 mL/min Final  . GFR calc Af Amer 01/01/2015 >60  >60 mL/min Final   Comment: (NOTE) The eGFR has been calculated using the CKD EPI equation. This calculation has not been validated in all clinical situations. eGFR's persistently <60 mL/min signify possible Chronic Kidney Disease.   . Anion gap 01/01/2015 7  5 - 15 Final  . WBC 01/01/2015 5.3  3.8 - 10.6 K/uL Final  . RBC 01/01/2015 4.57   4.40 - 5.90 MIL/uL Final  . Hemoglobin  01/01/2015 14.2  13.0 - 18.0 g/dL Final  . HCT 01/01/2015 43.2  40.0 - 52.0 % Final  . MCV 01/01/2015 94.6  80.0 - 100.0 fL Final  . MCH 01/01/2015 31.2  26.0 - 34.0 pg Final  . MCHC 01/01/2015 32.9  32.0 - 36.0 g/dL Final  . RDW 01/01/2015 15.3* 11.5 - 14.5 % Final  . Platelets 01/01/2015 148* 150 - 440 K/uL Final  . Neutrophils Relative % 01/01/2015 79   Final  . Neutro Abs 01/01/2015 4.2  1.4 - 6.5 K/uL Final  . Lymphocytes Relative 01/01/2015 9   Final  . Lymphs Abs 01/01/2015 0.5* 1.0 - 3.6 K/uL Final  . Monocytes Relative 01/01/2015 8   Final  . Monocytes Absolute 01/01/2015 0.4  0.2 - 1.0 K/uL Final  . Eosinophils Relative 01/01/2015 3   Final  . Eosinophils Absolute 01/01/2015 0.2  0 - 0.7 K/uL Final  . Basophils Relative 01/01/2015 1   Final  . Basophils Absolute 01/01/2015 0.0  0 - 0.1 K/uL Final      ASSESSMENT:  Muscle invasive carcinoma bladder History of diabetes History of hypertension  PLAN:  All lab data has been reviewed.  Patient is continuing radiation therapy will get weekly cis-platinum 2 and will be evaluated in third week.  Overall tolerance so far is fairly good.   Continue cycle 4 of chemotherapy Evaluate regarding 6 treatment which will be the last chemotherapy treatment Bladder cancer   Staging form: Urinary Bladder, AJCC 7th Edition     Clinical: Stage II (T2, N0, M0) - Signed by Forest Gleason, MD on 11/15/2014   Forest Gleason, MD   01/01/2015 11:06 AM

## 2015-01-02 ENCOUNTER — Ambulatory Visit (INDEPENDENT_AMBULATORY_CARE_PROVIDER_SITE_OTHER): Payer: Medicare Other | Admitting: Podiatry

## 2015-01-02 ENCOUNTER — Ambulatory Visit
Admission: RE | Admit: 2015-01-02 | Discharge: 2015-01-02 | Disposition: A | Discharge status: Home / Self Care | Payer: Medicare Other | Source: Ambulatory Visit | Attending: Radiation Oncology | Admitting: Radiation Oncology

## 2015-01-02 DIAGNOSIS — M79673 Pain in unspecified foot: Secondary | ICD-10-CM | POA: Diagnosis not present

## 2015-01-02 DIAGNOSIS — B351 Tinea unguium: Secondary | ICD-10-CM | POA: Diagnosis not present

## 2015-01-02 DIAGNOSIS — Q828 Other specified congenital malformations of skin: Secondary | ICD-10-CM

## 2015-01-02 DIAGNOSIS — Z510 Encounter for antineoplastic radiation therapy: Secondary | ICD-10-CM | POA: Diagnosis not present

## 2015-01-02 NOTE — Progress Notes (Signed)
Subjective: 81 y.o. male returns the office today for painful, elongated, thickened toenails which he is unable to trim himself. Denies any redness or drainage around the nails. Denies any acute changes since last appointment and no new complaints today. Denies any systemic complaints such as fevers, chills, nausea, vomiting.   Objective: AAO 3, NAD DP/PT pulses palpable, CRT less than 3 seconds  Nails hypertrophic, dystrophic, elongated, brittle, discolored 10. There is tenderness overlying the nails 1-5 bilaterally. There is no surrounding erythema or drainage along the nail sites. Hyperkeratotic lesion distal aspect right fourth digit. Upon debridement no underlying ulceration, drainage or other clinical signs of infection. No open lesions or pre-ulcerative lesions are identified. No other areas of tenderness bilateral lower extremities. No overlying edema, erythema, increased warmth. No pain with calf compression, swelling, warmth, erythema.  Assessment: Patient presents with symptomatic onychomycosis; right fourth digit corn  Plan: -Treatment options including alternatives, risks, complications were discussed -Nails sharply debrided 10 without complication/bleeding. -Hyperkeratotic lesion right fourth digit sharply debrided without complication/bleeding. -Discussed daily foot inspection. If there are any changes, to call the office immediately.  -Follow-up in 3 months or sooner if any problems are to arise. In the meantime, encouraged to call the office with any questions, concerns, changes symptoms.   Celesta Gentile, DPM

## 2015-01-03 ENCOUNTER — Ambulatory Visit
Admission: RE | Admit: 2015-01-03 | Discharge: 2015-01-03 | Disposition: A | Discharge status: Home / Self Care | Payer: Medicare Other | Source: Ambulatory Visit | Attending: Radiation Oncology | Admitting: Radiation Oncology

## 2015-01-03 DIAGNOSIS — Z510 Encounter for antineoplastic radiation therapy: Secondary | ICD-10-CM | POA: Diagnosis not present

## 2015-01-04 ENCOUNTER — Ambulatory Visit
Admission: RE | Admit: 2015-01-04 | Discharge: 2015-01-04 | Disposition: A | Discharge status: Home / Self Care | Payer: Medicare Other | Source: Ambulatory Visit | Attending: Radiation Oncology | Admitting: Radiation Oncology

## 2015-01-04 DIAGNOSIS — Z510 Encounter for antineoplastic radiation therapy: Secondary | ICD-10-CM | POA: Diagnosis not present

## 2015-01-05 ENCOUNTER — Ambulatory Visit
Admission: RE | Admit: 2015-01-05 | Discharge: 2015-01-05 | Disposition: A | Discharge status: Home / Self Care | Payer: Medicare Other | Source: Ambulatory Visit | Attending: Radiation Oncology | Admitting: Radiation Oncology

## 2015-01-05 DIAGNOSIS — Z510 Encounter for antineoplastic radiation therapy: Secondary | ICD-10-CM | POA: Diagnosis not present

## 2015-01-08 ENCOUNTER — Inpatient Hospital Stay: Payer: Medicare Other

## 2015-01-08 ENCOUNTER — Ambulatory Visit
Admission: RE | Admit: 2015-01-08 | Discharge: 2015-01-08 | Disposition: A | Discharge status: Home / Self Care | Payer: Medicare Other | Source: Ambulatory Visit | Attending: Radiation Oncology | Admitting: Radiation Oncology

## 2015-01-08 VITALS — BP 151/69 | HR 61 | Temp 96.0°F

## 2015-01-08 DIAGNOSIS — Z5111 Encounter for antineoplastic chemotherapy: Secondary | ICD-10-CM | POA: Diagnosis not present

## 2015-01-08 DIAGNOSIS — Z510 Encounter for antineoplastic radiation therapy: Secondary | ICD-10-CM | POA: Diagnosis not present

## 2015-01-08 DIAGNOSIS — C674 Malignant neoplasm of posterior wall of bladder: Secondary | ICD-10-CM

## 2015-01-08 LAB — CBC WITH DIFFERENTIAL/PLATELET
Basophils Absolute: 0 10*3/uL (ref 0–0.1)
Basophils Relative: 1 %
EOS PCT: 1 %
Eosinophils Absolute: 0.1 10*3/uL (ref 0–0.7)
HCT: 42.5 % (ref 40.0–52.0)
Hemoglobin: 13.9 g/dL (ref 13.0–18.0)
Lymphocytes Relative: 11 %
Lymphs Abs: 0.6 10*3/uL — ABNORMAL LOW (ref 1.0–3.6)
MCH: 31.1 pg (ref 26.0–34.0)
MCHC: 32.8 g/dL (ref 32.0–36.0)
MCV: 95.1 fL (ref 80.0–100.0)
MONOS PCT: 9 %
Monocytes Absolute: 0.5 10*3/uL (ref 0.2–1.0)
Neutro Abs: 4.6 10*3/uL (ref 1.4–6.5)
Neutrophils Relative %: 78 %
Platelets: 145 10*3/uL — ABNORMAL LOW (ref 150–440)
RBC: 4.47 MIL/uL (ref 4.40–5.90)
RDW: 15.3 % — AB (ref 11.5–14.5)
WBC: 5.8 10*3/uL (ref 3.8–10.6)

## 2015-01-08 LAB — BASIC METABOLIC PANEL
Anion gap: 6 (ref 5–15)
BUN: 17 mg/dL (ref 6–20)
CO2: 28 mmol/L (ref 22–32)
Calcium: 8.3 mg/dL — ABNORMAL LOW (ref 8.9–10.3)
Chloride: 104 mmol/L (ref 101–111)
Creatinine, Ser: 0.89 mg/dL (ref 0.61–1.24)
GFR calc Af Amer: >60 mL/min (ref >60)
Glucose, Bld: 140 mg/dL — ABNORMAL HIGH (ref 65–99)
POTASSIUM: 4.4 mmol/L (ref 3.5–5.1)
SODIUM: 138 mmol/L (ref 135–145)

## 2015-01-08 MED ORDER — SODIUM CHLORIDE 0.9 % IJ SOLN
10.0000 mL | INTRAMUSCULAR | Status: DC | PRN
  Filled 2015-01-08: qty 10

## 2015-01-08 MED ORDER — POTASSIUM CHLORIDE 2 MEQ/ML IV SOLN
Freq: Once | INTRAVENOUS | Status: AC
  Administered 2015-01-08: 12:00:00 via INTRAVENOUS
  Filled 2015-01-08: qty 1000

## 2015-01-08 MED ORDER — SODIUM CHLORIDE 0.9 % IV SOLN
Freq: Once | INTRAVENOUS | Status: AC
  Administered 2015-01-08: 12:00:00 via INTRAVENOUS
  Filled 2015-01-08: qty 1000

## 2015-01-08 MED ORDER — SODIUM CHLORIDE 0.9 % IV SOLN
20.0000 mg/m2 | Freq: Once | INTRAVENOUS | Status: AC
  Administered 2015-01-08: 41 mg via INTRAVENOUS
  Filled 2015-01-08: qty 41

## 2015-01-08 MED ORDER — PALONOSETRON HCL INJECTION 0.25 MG/5ML
0.2500 mg | Freq: Once | INTRAVENOUS | Status: AC
  Administered 2015-01-08: 0.25 mg via INTRAVENOUS
  Filled 2015-01-08: qty 5

## 2015-01-08 MED ORDER — SODIUM CHLORIDE 0.9 % IV SOLN
Freq: Once | INTRAVENOUS | Status: AC
  Administered 2015-01-08: 14:00:00 via INTRAVENOUS
  Filled 2015-01-08: qty 5

## 2015-01-09 ENCOUNTER — Ambulatory Visit
Admission: RE | Admit: 2015-01-09 | Discharge: 2015-01-09 | Disposition: A | Discharge status: Home / Self Care | Payer: Medicare Other | Source: Ambulatory Visit | Attending: Radiation Oncology | Admitting: Radiation Oncology

## 2015-01-09 DIAGNOSIS — Z510 Encounter for antineoplastic radiation therapy: Secondary | ICD-10-CM | POA: Diagnosis not present

## 2015-01-10 ENCOUNTER — Ambulatory Visit: Admission: RE | Admit: 2015-01-10 | Payer: Medicare Other | Source: Ambulatory Visit

## 2015-01-10 ENCOUNTER — Ambulatory Visit: Payer: Medicare Other | Admitting: Podiatry

## 2015-01-10 ENCOUNTER — Ambulatory Visit
Admission: RE | Admit: 2015-01-10 | Discharge: 2015-01-10 | Disposition: A | Discharge status: Home / Self Care | Payer: Medicare Other | Source: Ambulatory Visit | Attending: Radiation Oncology | Admitting: Radiation Oncology

## 2015-01-10 DIAGNOSIS — Z510 Encounter for antineoplastic radiation therapy: Secondary | ICD-10-CM | POA: Diagnosis not present

## 2015-01-11 ENCOUNTER — Ambulatory Visit
Admission: RE | Admit: 2015-01-11 | Discharge: 2015-01-11 | Disposition: A | Discharge status: Home / Self Care | Payer: Medicare Other | Source: Ambulatory Visit | Attending: Radiation Oncology | Admitting: Radiation Oncology

## 2015-01-11 DIAGNOSIS — Z510 Encounter for antineoplastic radiation therapy: Secondary | ICD-10-CM | POA: Diagnosis not present

## 2015-01-12 ENCOUNTER — Ambulatory Visit
Admission: RE | Admit: 2015-01-12 | Discharge: 2015-01-12 | Disposition: A | Discharge status: Home / Self Care | Payer: Medicare Other | Source: Ambulatory Visit | Attending: Radiation Oncology | Admitting: Radiation Oncology

## 2015-01-12 DIAGNOSIS — Z510 Encounter for antineoplastic radiation therapy: Secondary | ICD-10-CM | POA: Diagnosis not present

## 2015-01-15 ENCOUNTER — Ambulatory Visit
Admission: RE | Admit: 2015-01-15 | Discharge: 2015-01-15 | Disposition: A | Discharge status: Home / Self Care | Payer: Medicare Other | Source: Ambulatory Visit | Attending: Radiation Oncology | Admitting: Radiation Oncology

## 2015-01-15 ENCOUNTER — Encounter: Payer: Self-pay | Admitting: Oncology

## 2015-01-15 ENCOUNTER — Other Ambulatory Visit: Payer: Medicare Other

## 2015-01-15 ENCOUNTER — Inpatient Hospital Stay (HOSPITAL_BASED_OUTPATIENT_CLINIC_OR_DEPARTMENT_OTHER): Payer: Medicare Other | Admitting: Oncology

## 2015-01-15 ENCOUNTER — Inpatient Hospital Stay: Payer: Medicare Other

## 2015-01-15 VITALS — BP 137/87 | HR 89 | Temp 97.3°F | Wt 182.1 lb

## 2015-01-15 VITALS — BP 139/84 | HR 78 | Temp 97.1°F | Resp 18

## 2015-01-15 DIAGNOSIS — C674 Malignant neoplasm of posterior wall of bladder: Secondary | ICD-10-CM | POA: Diagnosis not present

## 2015-01-15 DIAGNOSIS — R339 Retention of urine, unspecified: Secondary | ICD-10-CM

## 2015-01-15 DIAGNOSIS — I10 Essential (primary) hypertension: Secondary | ICD-10-CM

## 2015-01-15 DIAGNOSIS — Z5111 Encounter for antineoplastic chemotherapy: Secondary | ICD-10-CM | POA: Diagnosis not present

## 2015-01-15 DIAGNOSIS — Z79899 Other long term (current) drug therapy: Secondary | ICD-10-CM

## 2015-01-15 DIAGNOSIS — E119 Type 2 diabetes mellitus without complications: Secondary | ICD-10-CM

## 2015-01-15 DIAGNOSIS — Z510 Encounter for antineoplastic radiation therapy: Secondary | ICD-10-CM | POA: Diagnosis not present

## 2015-01-15 DIAGNOSIS — G4730 Sleep apnea, unspecified: Secondary | ICD-10-CM

## 2015-01-15 DIAGNOSIS — E039 Hypothyroidism, unspecified: Secondary | ICD-10-CM

## 2015-01-15 DIAGNOSIS — R310 Gross hematuria: Secondary | ICD-10-CM | POA: Diagnosis not present

## 2015-01-15 DIAGNOSIS — M129 Arthropathy, unspecified: Secondary | ICD-10-CM

## 2015-01-15 DIAGNOSIS — I499 Cardiac arrhythmia, unspecified: Secondary | ICD-10-CM

## 2015-01-15 LAB — CBC WITH DIFFERENTIAL/PLATELET
BASOS ABS: 0 10*3/uL (ref 0–0.1)
BASOS PCT: 1 %
Eosinophils Absolute: 0 10*3/uL (ref 0–0.7)
Eosinophils Relative: 1 %
HEMATOCRIT: 41.9 % (ref 40.0–52.0)
Hemoglobin: 14 g/dL (ref 13.0–18.0)
Lymphocytes Relative: 7 %
Lymphs Abs: 0.4 10*3/uL — ABNORMAL LOW (ref 1.0–3.6)
MCH: 31.7 pg (ref 26.0–34.0)
MCHC: 33.3 g/dL (ref 32.0–36.0)
MCV: 95.1 fL (ref 80.0–100.0)
MONO ABS: 0.5 10*3/uL (ref 0.2–1.0)
Monocytes Relative: 9 %
NEUTROS ABS: 4.6 10*3/uL (ref 1.4–6.5)
Neutrophils Relative %: 82 %
Platelets: 107 10*3/uL — ABNORMAL LOW (ref 150–440)
RBC: 4.41 MIL/uL (ref 4.40–5.90)
RDW: 16.2 % — AB (ref 11.5–14.5)
WBC: 5.6 10*3/uL (ref 3.8–10.6)

## 2015-01-15 LAB — COMPREHENSIVE METABOLIC PANEL
ALT: 13 U/L — AB (ref 17–63)
AST: 19 U/L (ref 15–41)
Albumin: 3.7 g/dL (ref 3.5–5.0)
Alkaline Phosphatase: 57 U/L (ref 38–126)
Anion gap: 5 (ref 5–15)
BILIRUBIN TOTAL: 0.8 mg/dL (ref 0.3–1.2)
BUN: 21 mg/dL — ABNORMAL HIGH (ref 6–20)
CALCIUM: 8.1 mg/dL — AB (ref 8.9–10.3)
CHLORIDE: 105 mmol/L (ref 101–111)
CO2: 27 mmol/L (ref 22–32)
CREATININE: 0.92 mg/dL (ref 0.61–1.24)
GFR calc Af Amer: >60 mL/min (ref >60)
GLUCOSE: 212 mg/dL — AB (ref 65–99)
POTASSIUM: 4.1 mmol/L (ref 3.5–5.1)
Sodium: 137 mmol/L (ref 135–145)
TOTAL PROTEIN: 6.7 g/dL (ref 6.5–8.1)

## 2015-01-15 LAB — MAGNESIUM: MAGNESIUM: 2 mg/dL (ref 1.7–2.4)

## 2015-01-15 MED ORDER — PALONOSETRON HCL INJECTION 0.25 MG/5ML
0.2500 mg | Freq: Once | INTRAVENOUS | Status: AC
  Administered 2015-01-15: 0.25 mg via INTRAVENOUS
  Filled 2015-01-15: qty 5

## 2015-01-15 MED ORDER — SODIUM CHLORIDE 0.9 % IV SOLN
Freq: Once | INTRAVENOUS | Status: AC
  Administered 2015-01-15: 14:00:00 via INTRAVENOUS
  Filled 2015-01-15: qty 5

## 2015-01-15 MED ORDER — SODIUM CHLORIDE 0.9 % IV SOLN
Freq: Once | INTRAVENOUS | Status: AC
  Administered 2015-01-15: 12:00:00 via INTRAVENOUS
  Filled 2015-01-15: qty 1000

## 2015-01-15 MED ORDER — POTASSIUM CHLORIDE 2 MEQ/ML IV SOLN
Freq: Once | INTRAVENOUS | Status: AC
  Administered 2015-01-15: 12:00:00 via INTRAVENOUS
  Filled 2015-01-15: qty 1000

## 2015-01-15 MED ORDER — SODIUM CHLORIDE 0.9 % IV SOLN
20.0000 mg/m2 | Freq: Once | INTRAVENOUS | Status: AC
  Administered 2015-01-15: 41 mg via INTRAVENOUS
  Filled 2015-01-15: qty 41

## 2015-01-15 NOTE — Progress Notes (Signed)
Oncology Nurse Navigator Documentation  Oncology Nurse Navigator Flowsheets 11/14/2014 12/12/2014  Referral date to RadOnc/MedOnc 11/14/2014 -  Navigator Encounter Type Initial MedOnc Telephone  Needs documentation of all XRT treatments for insurance purposes. Maudie Mercury in XRT notified Oncology Nurse Navigator Documentation  Oncology Nurse Navigator Flowsheets 11/14/2014 12/12/2014  Referral date to RadOnc/MedOnc 11/14/2014 -  Navigator Encounter Type Initial [place] @ [facility] Telephone:(336) [phone]  Fax:(336) [phone]   I follow-up note  Priyansh Pry OB: 1925-08-25  MR#: [id number]  AOZ#:[id number]  Patient Care Team: Albina Billet, MD as PCP - General (Internal Medicine) Royston Cowper, MD as Consulting Physician (Urology)  CHIEF COMPLAINT:  Chief Complaint  Patient presents with  . Follow-up    VISIT DIAGNOSIS:     ICD-9-CM ICD-10-CM   1. 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  Tumor was muscle invasion with lymphovascular invasion Patient  had a transurethral resection of bladder tumor (Oct 24, 2014) 3.patient will start cis-platinum and radiation therapy from June of 2016 4.  Patient has finished total 6 cycles of chemotherapy with cis-platinum on July 25.  And will finish radiation therapy on August 4 of 2016       Bladder cancer   10/24/2014 Initial Diagnosis Bladder cancer    Oncology Flowsheet 10/24/2014 12/11/2014 12/18/2014 12/27/2014 01/01/2015 01/08/2015  Day, Cycle - Day 1, Cycle 1  Day 8, Cycle 1 Day 1, Cycle 2 Day 8, Cycle 2 Day 1, Cycle 3  CISplatin (PLATINOL) IV - 20 mg/m2 20 mg/m2 20 mg/m2 20 mg/m2 20 mg/m2  dexamethasone (DECADRON) IV - [ 12 mg ] [ 12 mg ] [ 12 mg ] [ 12 mg ] [ 12 mg ]  fosaprepitant (EMEND) IV - [ 150 mg ] [ 150 mg ] [ 150 mg ] [ 150 mg ] [ 150 mg ]  mitoMYcin (MUTAMYCIN) IV - - - - - -  ondansetron (ZOFRAN) IV - - - - - -  palonosetron (ALOXI) IV - 0.25 mg 0.25 mg 0.25 mg 0.25 mg 0.25 mg    INTERVAL HISTORY: 81 year old gentleman had intermittent gross painless hematuria.  Started 4 weeks ago patient on underwent cystoscopy and CT scan.  Patient was supposed to have cystoscopy in April however felt weak was admitted in the hospital had a cardiac evaluation no significant problem was found.  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  Patient had transurethral resection of bladder tumor and mitomycin was instilled. He was then referred to me for further evaluation and treatment planning. No hematuria.  Family by the side of the patient today. today January 15, 2015 Patient is here for ongoing evaluation regarding carcinoma of bladder locally advanced disease on chemoradiation therapy. Patient has difficulty passing urine frequency has to get up in lieu of the night taking Flomax no burning no chills no fever.  This is the last chemotherapy today and patient is finishing up radiation therapy and fourth August. REVIEW OF SYSTEMS:   Gen.  status: Patient is alert oriented not in any acute distress.  No chills.  No fever.  HEENT: No headache.  Hard of hearing.  No soreness in the mouth. Lungs: No shortness of breath.  Dyspnea on exertion.  No cough.  No hemoptysis. Cardiac: No chest pain.  No palpitation patient does have hypertension Endocrine system: Patient does have diabetes GI: No nausea no vomiting no diarrhea of GU: Frequency has to get up frequently in the night Musculoskeletal system chronic back pain and  joint pains Lower extremitywell being Skin: No rash Psychiatric system no significant problem  As per HPI. Otherwise, a complete review of systems is negatve.  PAST MEDICAL HISTORY: Past Medical History  Diagnosis Date  . Non-insulin dependent type 2 diabetes mellitus   . Arthritis   . Hypertension   . Sleep apnea   . B12 deficiency   . Dysrhythmia     Bradycardia    PAST SURGICAL HISTORY: Past Surgical History  Procedure Laterality Date  . Cyst on spine  1961  . Ear cyst excision    . Transurethral resection of bladder tumor N/A 10/24/2014    Procedure: TRANSURETHRAL RESECTION OF BLADDER TUMOR (TURBT);  Surgeon: Royston Cowper, MD;  Location: [facility] ORS;  Service: Urology;  Laterality: N/A;    FAMILY HISTORY Family History  Problem Relation Age of Onset  . Diabetes Neg Hx    no significant family history of colon cancer, ovarian cancer, breast cancer Mother died of Alzheimer's disease.  At age 23      ADVANCED DIRECTIVES: Patient does have advanced healthcare directive   HEALTH MAINTENANCE: History  Substance Use Topics  . Smoking status: Former Smoker    Quit date: 06/13/1976  . Smokeless tobacco: Never Used  . Alcohol Use: Yes     Comment: monthly 3 beers since xmas      No Known Allergies  Current Outpatient Prescriptions  Medication Sig Dispense Refill  . docusate sodium (COLACE) 100 MG capsule Take 2 capsules (200 mg total) by mouth 2 (two) times daily. 120 capsule 3  . furosemide (LASIX) 20 MG tablet Take 20 mg by mouth.    . Glucosamine HCl (GLUCOSAMINE PO) Take 1,500 mg by mouth 2 (two) times daily.     Marland Kitchen glyBURIDE (DIABETA) 5 MG tablet Take 5 mg by mouth daily with breakfast.    . hyoscyamine (LEVSIN/SL) 0.125 MG SL tablet Place 1 tablet (0.125 mg total) under the tongue every 4 (four) hours as needed. 1-2 TABS 40 tablet 3  . levofloxacin (LEVAQUIN) 500 MG tablet Take 1 tablet (500 mg total) by mouth daily. 7 tablet 0  . lovastatin (MEVACOR) 20 MG  tablet     . lovastatin (MEVACOR) 40 MG tablet Take 40 mg by mouth at bedtime.    . metFORMIN (GLUCOPHAGE) 1000 MG tablet Take 500 mg by mouth 2 (two) times daily with a meal.     . metFORMIN (GLUCOPHAGE) 500 MG tablet     . Methen-Hyosc-Meth Blue-Na Phos (UROGESIC-BLUE) 81.6 MG TABS Take 1 tablet (81.6 mg total) by mouth every 6 (six) hours as needed. 40 tablet 3  . NUCYNTA 50 MG TABS tablet Take 1 tablet (50 mg total) by mouth every 6 (six) hours as needed. 1 TO 2 TABS Q 6 HOURS PRN PAIN 30 tablet 0  . ondansetron (ZOFRAN) 8 MG tablet Take 1 tablet (8 mg total) by mouth 2 (two) times daily as needed. Start on the third day after chemotherapy. 30 tablet 1  .  ramipril (ALTACE) 2.5 MG capsule Take 2.5 mg by mouth daily.    . saw palmetto 500 MG capsule Take 900 mg by mouth daily.    . tamsulosin (FLOMAX) 0.4 MG CAPS capsule     . vitamin B-12 (CYANOCOBALAMIN) 1000 MCG tablet Take 1,000 mcg by mouth daily.     No current facility-administered medications for this visit.    OBJECTIVE: PHYSICAL EXAM: No known status: Elderly individual not any acute distress HEENT: No abnormality detected.  No palpable neck masses.  Thyroid within normal limit Lungs: Emphysematous chest.  No rhonchi no rales Cardiac: Soft systolic murmur.  Regular heart sounds bradycardia Abdomen: Protuberant abdomen.  No ascites.  No palpable masses. Lower extremity no edema Skin: No rash Musculoskeletal system: Kyphoscoliosis Neurological system: Higher functions within normal limit.  Cranial nodes are intact.  No other localizing sign Lymphatic system: Supraclavicular, cervical, axillary, inguinal lymph nodes are not palpable GU: Frequency and dysuria  Filed Vitals:   01/15/15 1016  BP: 137/87  Pulse: 89  Temp: 97.3 F (36.3 C)     Body mass index is 24.69 kg/(m^2).    ECOG FS:1 - Symptomatic but completely ambulatory  LAB RESULTS:  Appointment on 01/15/2015  Component Date Value Ref Range Status  . WBC  01/15/2015 5.6  3.8 - 10.6 K/uL Final  . RBC 01/15/2015 4.41  4.40 - 5.90 MIL/uL Final  . Hemoglobin 01/15/2015 14.0  13.0 - 18.0 g/dL Final  . HCT 01/15/2015 41.9  40.0 - 52.0 % Final  . MCV 01/15/2015 95.1  80.0 - 100.0 fL Final  . MCH 01/15/2015 31.7  26.0 - 34.0 pg Final  . MCHC 01/15/2015 33.3  32.0 - 36.0 g/dL Final  . RDW 01/15/2015 16.2* 11.5 - 14.5 % Final  . Platelets 01/15/2015 107* 150 - 440 K/uL Final  . Neutrophils Relative % 01/15/2015 82   Final  . Neutro Abs 01/15/2015 4.6  1.4 - 6.5 K/uL Final  . Lymphocytes Relative 01/15/2015 7   Final  . Lymphs Abs 01/15/2015 0.4* 1.0 - 3.6 K/uL Final  . Monocytes Relative 01/15/2015 9   Final  . Monocytes Absolute 01/15/2015 0.5  0.2 - 1.0 K/uL Final  . Eosinophils Relative 01/15/2015 1   Final  . Eosinophils Absolute 01/15/2015 0.0  0 - 0.7 K/uL Final  . Basophils Relative 01/15/2015 1   Final  . Basophils Absolute 01/15/2015 0.0  0 - 0.1 K/uL Final  . Sodium 01/15/2015 137  135 - 145 mmol/L Final  . Potassium 01/15/2015 4.1  3.5 - 5.1 mmol/L Final  . Chloride 01/15/2015 105  101 - 111 mmol/L Final  . CO2 01/15/2015 27  22 - 32 mmol/L Final  . Glucose, Bld 01/15/2015 212* 65 - 99 mg/dL Final  . BUN 01/15/2015 21* 6 - 20 mg/dL Final  . Creatinine, Ser 01/15/2015 0.92  0.61 - 1.24 mg/dL Final  . Calcium 01/15/2015 8.1* 8.9 - 10.3 mg/dL Final  . Total Protein 01/15/2015 6.7  6.5 - 8.1 g/dL Final  . Albumin 01/15/2015 3.7  3.5 - 5.0 g/dL Final  . AST 01/15/2015 19  15 - 41 U/L Final  . ALT 01/15/2015 13* 17 - 63 U/L Final  . Alkaline Phosphatase 01/15/2015 57  38 - 126 U/L Final  . Total Bilirubin 01/15/2015 0.8  0.3 - 1.2 mg/dL Final  . GFR calc non Af Amer 01/15/2015 >60  >60 mL/min Final  . GFR calc Af Amer 01/15/2015 >60  >60 mL/min Final  Comment: (NOTE) The eGFR has been calculated using the CKD EPI equation. This calculation has not been validated in all clinical situations. eGFR's persistently <60 mL/min signify  possible Chronic Kidney Disease.   . Anion gap 01/15/2015 5  5 - 15 Final  . Magnesium 01/15/2015 2.0  1.7 - 2.4 mg/dL Final      ASSESSMENT:  Muscle invasive carcinoma bladder History of diabetes History of hypertension Patient is finishing up radiation therapy in August 4 and chemotherapy today.  Patient has been scheduled to see Dr. Eliberto Ivory on now 16th of August but we would like to get cystoscopy done in second week of September. Reevaluation after cystoscopy. Check urine analysis and see if there is any infection or inflammation we can treat with nitrofurantoin no Cipro depending on the urine analysis and culture report Most likely cystitis secondary to radiation therapy Total duration of visit was 30 minutes.  50% or more time was spent in counseling patient and family regarding prognosis and options of treatment and available resources PLAN:  All lab data has been reviewed.  Patient is continuing radiation therapy will get weekly cis-platinum 2 and will be evaluated in third week.  Overall tolerance so far is fairly good.   Continue cycle 6 of chemotherapy Evaluate regarding 6 treatment which will be the last chemotherapy treatment Bladder cancer   Staging form: Urinary Bladder, AJCC 7th Edition     Clinical: Stage II (T2, N0, M0) - Signed by Forest Gleason, MD on 11/15/2014   Forest Gleason, MD   01/15/2015 10:57 AM

## 2015-01-15 NOTE — Progress Notes (Signed)
Patient does have living will.  Former smoker. 

## 2015-01-16 ENCOUNTER — Ambulatory Visit
Admission: RE | Admit: 2015-01-16 | Discharge: 2015-01-16 | Disposition: A | Discharge status: Home / Self Care | Payer: Medicare Other | Source: Ambulatory Visit | Attending: Radiation Oncology | Admitting: Radiation Oncology

## 2015-01-16 DIAGNOSIS — Z510 Encounter for antineoplastic radiation therapy: Secondary | ICD-10-CM | POA: Diagnosis not present

## 2015-01-16 MED ORDER — CIPROFLOXACIN HCL 500 MG PO TABS: 500.0000 mg | ORAL_TABLET | Freq: Two times a day (BID) | ORAL | Status: DC

## 2015-01-16 NOTE — Telephone Encounter (Signed)
rx for cipro escribed to pharmacy.

## 2015-01-17 ENCOUNTER — Ambulatory Visit
Admission: RE | Admit: 2015-01-17 | Discharge: 2015-01-17 | Disposition: A | Discharge status: Home / Self Care | Payer: Medicare Other | Source: Ambulatory Visit | Attending: Radiation Oncology | Admitting: Radiation Oncology

## 2015-01-17 DIAGNOSIS — Z510 Encounter for antineoplastic radiation therapy: Secondary | ICD-10-CM | POA: Diagnosis not present

## 2015-01-17 LAB — URINE CULTURE

## 2015-01-18 ENCOUNTER — Ambulatory Visit
Admission: RE | Admit: 2015-01-18 | Discharge: 2015-01-18 | Disposition: A | Discharge status: Home / Self Care | Payer: Medicare Other | Source: Ambulatory Visit | Attending: Radiation Oncology | Admitting: Radiation Oncology

## 2015-01-18 DIAGNOSIS — Z510 Encounter for antineoplastic radiation therapy: Secondary | ICD-10-CM | POA: Diagnosis not present

## 2015-01-19 ENCOUNTER — Ambulatory Visit
Admission: RE | Admit: 2015-01-19 | Discharge: 2015-01-19 | Disposition: A | Discharge status: Home / Self Care | Payer: Medicare Other | Source: Ambulatory Visit | Attending: Radiation Oncology | Admitting: Radiation Oncology

## 2015-01-19 DIAGNOSIS — Z510 Encounter for antineoplastic radiation therapy: Secondary | ICD-10-CM | POA: Diagnosis not present

## 2015-01-22 ENCOUNTER — Ambulatory Visit
Admission: RE | Admit: 2015-01-22 | Discharge: 2015-01-22 | Disposition: A | Discharge status: Home / Self Care | Payer: Medicare Other | Source: Ambulatory Visit | Attending: Radiation Oncology | Admitting: Radiation Oncology

## 2015-01-22 DIAGNOSIS — Z510 Encounter for antineoplastic radiation therapy: Secondary | ICD-10-CM | POA: Diagnosis not present

## 2015-01-23 ENCOUNTER — Ambulatory Visit
Admission: RE | Admit: 2015-01-23 | Discharge: 2015-01-23 | Disposition: A | Discharge status: Home / Self Care | Payer: Medicare Other | Source: Ambulatory Visit | Attending: Radiation Oncology | Admitting: Radiation Oncology

## 2015-01-23 ENCOUNTER — Other Ambulatory Visit: Payer: Self-pay | Admitting: *Deleted

## 2015-01-23 DIAGNOSIS — Z510 Encounter for antineoplastic radiation therapy: Secondary | ICD-10-CM | POA: Diagnosis not present

## 2015-01-23 DIAGNOSIS — C679 Malignant neoplasm of bladder, unspecified: Secondary | ICD-10-CM

## 2015-01-23 MED ORDER — MIRABEGRON ER 25 MG PO TB24: 25.0000 mg | ORAL_TABLET | Freq: Every day | ORAL | Status: DC

## 2015-01-23 MED ORDER — MIRABEGRON ER 25 MG PO TB24
25.0000 mg | ORAL_TABLET | Freq: Every day | ORAL | Status: DC
Start: 2015-01-23 — End: 2017-07-20

## 2015-01-24 ENCOUNTER — Ambulatory Visit
Admission: RE | Admit: 2015-01-24 | Discharge: 2015-01-24 | Disposition: A | Discharge status: Home / Self Care | Payer: Medicare Other | Source: Ambulatory Visit | Attending: Radiation Oncology | Admitting: Radiation Oncology

## 2015-01-24 DIAGNOSIS — Z510 Encounter for antineoplastic radiation therapy: Secondary | ICD-10-CM | POA: Diagnosis not present

## 2015-01-25 ENCOUNTER — Ambulatory Visit
Admission: RE | Admit: 2015-01-25 | Discharge: 2015-01-25 | Disposition: A | Discharge status: Home / Self Care | Payer: Medicare Other | Source: Ambulatory Visit | Attending: Radiation Oncology | Admitting: Radiation Oncology

## 2015-01-25 DIAGNOSIS — Z510 Encounter for antineoplastic radiation therapy: Secondary | ICD-10-CM | POA: Diagnosis not present

## 2015-02-15 ENCOUNTER — Other Ambulatory Visit: Payer: Medicare Other

## 2015-02-15 ENCOUNTER — Ambulatory Visit: Payer: Medicare Other | Admitting: Oncology

## 2015-02-27 ENCOUNTER — Encounter: Payer: Self-pay | Admitting: Oncology

## 2015-02-27 ENCOUNTER — Inpatient Hospital Stay: Payer: Medicare Other

## 2015-02-27 ENCOUNTER — Inpatient Hospital Stay (HOSPITAL_BASED_OUTPATIENT_CLINIC_OR_DEPARTMENT_OTHER): Payer: Medicare Other | Admitting: Oncology

## 2015-02-27 ENCOUNTER — Inpatient Hospital Stay: Payer: Medicare Other | Attending: Oncology

## 2015-02-27 VITALS — BP 144/68 | HR 62 | Temp 96.6°F | Wt 186.0 lb

## 2015-02-27 DIAGNOSIS — C679 Malignant neoplasm of bladder, unspecified: Secondary | ICD-10-CM | POA: Insufficient documentation

## 2015-02-27 DIAGNOSIS — Z79899 Other long term (current) drug therapy: Secondary | ICD-10-CM

## 2015-02-27 DIAGNOSIS — M129 Arthropathy, unspecified: Secondary | ICD-10-CM | POA: Diagnosis not present

## 2015-02-27 DIAGNOSIS — E538 Deficiency of other specified B group vitamins: Secondary | ICD-10-CM

## 2015-02-27 DIAGNOSIS — C674 Malignant neoplasm of posterior wall of bladder: Secondary | ICD-10-CM

## 2015-02-27 DIAGNOSIS — R001 Bradycardia, unspecified: Secondary | ICD-10-CM | POA: Insufficient documentation

## 2015-02-27 DIAGNOSIS — E119 Type 2 diabetes mellitus without complications: Secondary | ICD-10-CM

## 2015-02-27 DIAGNOSIS — G4730 Sleep apnea, unspecified: Secondary | ICD-10-CM | POA: Insufficient documentation

## 2015-02-27 DIAGNOSIS — Z923 Personal history of irradiation: Secondary | ICD-10-CM | POA: Insufficient documentation

## 2015-02-27 DIAGNOSIS — I10 Essential (primary) hypertension: Secondary | ICD-10-CM | POA: Diagnosis not present

## 2015-02-27 DIAGNOSIS — Z87891 Personal history of nicotine dependence: Secondary | ICD-10-CM | POA: Insufficient documentation

## 2015-02-27 DIAGNOSIS — Z9221 Personal history of antineoplastic chemotherapy: Secondary | ICD-10-CM | POA: Insufficient documentation

## 2015-02-27 LAB — COMPREHENSIVE METABOLIC PANEL
ALBUMIN: 3.9 g/dL (ref 3.5–5.0)
ALK PHOS: 72 U/L (ref 38–126)
ALT: 15 U/L — AB (ref 17–63)
AST: 20 U/L (ref 15–41)
Anion gap: 8 (ref 5–15)
BILIRUBIN TOTAL: 0.4 mg/dL (ref 0.3–1.2)
BUN: 17 mg/dL (ref 6–20)
CALCIUM: 8.5 mg/dL — AB (ref 8.9–10.3)
CO2: 29 mmol/L (ref 22–32)
CREATININE: 0.98 mg/dL (ref 0.61–1.24)
Chloride: 102 mmol/L (ref 101–111)
GFR calc Af Amer: >60 mL/min (ref >60)
GFR calc non Af Amer: >60 mL/min (ref >60)
GLUCOSE: 235 mg/dL — AB (ref 65–99)
Potassium: 4.3 mmol/L (ref 3.5–5.1)
Sodium: 139 mmol/L (ref 135–145)
Total Protein: 7.4 g/dL (ref 6.5–8.1)

## 2015-02-27 LAB — CBC WITH DIFFERENTIAL/PLATELET
Basophils Absolute: 0.1 10*3/uL (ref 0–0.1)
Basophils Relative: 1 %
Eosinophils Absolute: 0.1 10*3/uL (ref 0–0.7)
Eosinophils Relative: 1 %
HEMATOCRIT: 42.6 % (ref 40.0–52.0)
HEMOGLOBIN: 14.4 g/dL (ref 13.0–18.0)
LYMPHS ABS: 1.5 10*3/uL (ref 1.0–3.6)
Lymphocytes Relative: 18 %
MCH: 33.1 pg (ref 26.0–34.0)
MCHC: 33.9 g/dL (ref 32.0–36.0)
MCV: 97.7 fL (ref 80.0–100.0)
Monocytes Absolute: 1.1 10*3/uL — ABNORMAL HIGH (ref 0.2–1.0)
Monocytes Relative: 13 %
NEUTROS ABS: 5.7 10*3/uL (ref 1.4–6.5)
NEUTROS PCT: 67 %
Platelets: 176 10*3/uL (ref 150–440)
RBC: 4.36 MIL/uL — AB (ref 4.40–5.90)
RDW: 17.1 % — ABNORMAL HIGH (ref 11.5–14.5)
WBC: 8.5 10*3/uL (ref 3.8–10.6)

## 2015-02-27 NOTE — Progress Notes (Signed)
Patient does have living will.  Former smoker. 

## 2015-02-27 NOTE — Progress Notes (Signed)
Survivorship Care Plan visit completed.  Treatment summary reviewed and given to patient and son.  ASCO questions Survivorship booklet reviewed and given to patient.  CARE program and cancer transitions discussed with patient along with other resources available.  Discussed the importance of follow up care.

## 2015-03-04 NOTE — Progress Notes (Signed)
[place] @ [facility] Telephone:(336) [phone]  Fax:(336) [phone]   INITIAL CONSULT  Shermar Friedland OB: March 03, 1926  MR#: [id number]  DCV#:[id number]  Patient Care Team: Albina Billet, MD as PCP - General (Internal Medicine) Royston Cowper, MD as Consulting Physician (Urology)  CHIEF COMPLAINT:  Chief Complaint  Patient presents with  . Follow-up    VISIT DIAGNOSIS:     ICD-9-CM ICD-10-CM   1. 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  Tumor was muscle invasion with lymphovascular invasion Patient  had a transurethral resection of bladder tumor (Oct 24, 2014) 3.patient will start cis-platinum and radiation therapy from June of 2016 4.  Patient has finished total 6 cycles of chemotherapy with cis-platinum on July 25.  And will finish radiation therapy on August 4 of 2016       Bladder cancer   10/24/2014 Initial Diagnosis Bladder cancer    Oncology Flowsheet 10/24/2014 12/11/2014 12/18/2014 12/27/2014 01/01/2015 01/08/2015 01/15/2015  Day, Cycle - Day 1, Cycle 1 Day 8, Cycle 1 Day 1, Cycle 2 Day 8, Cycle 2 Day 1, Cycle 3 Day 8, Cycle 3  CISplatin (PLATINOL) IV - 20 mg/m2 20 mg/m2 20 mg/m2 20 mg/m2 20 mg/m2 20 mg/m2  dexamethasone (DECADRON) IV - [ 12 mg ] [ 12 mg ] [ 12 mg ] [ 12 mg ] [ 12 mg ] [ 12 mg ]  fosaprepitant (EMEND) IV - [ 150 mg ] [ 150 mg ] [ 150 mg ] [ 150 mg ] [ 150 mg ] [ 150 mg ]  mitoMYcin (MUTAMYCIN) IV - - - - - - -  ondansetron (ZOFRAN) IV - - - - - - -  palonosetron (ALOXI) IV - 0.25 mg 0.25 mg 0.25 mg 0.25 mg 0.25 mg 0.25 mg    INTERVAL HISTORY: 81 year old gentleman had intermittent gross painless hematuria.   Started 4 weeks ago patient on underwent cystoscopy and CT scan.  Patient was supposed to have cystoscopy in April however felt weak was admitted in the hospital had a cardiac evaluation no significant problem was found.  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  Patient had transurethral resection of bladder tumor and mitomycin was instilled. He was then referred to me for further evaluation and treatment planning. No hematuria.  Family by the side of the patient today. today June 20: 2016 S&S started radiation therapy here to discuss on regarding cis-platinum treatment.  No induction therapy becauseofbasisoveralloldageandmultiplemedicalproblems December 18, 2014 Patient is here for ongoing evaluation and continuation of chemotherapy with cis-platinum.  Has mild hematuria no abdominal pain did not have any nausea vomiting diarrhea.  Continuing radiation therapy. January 01, 2015 Patient is here for fourth cycle of chemotherapy with cis-platinum tolerating treatment very well.  No nausea no vomiting no diarrhea.  Also getting radiation therapy.  No tingling.  No numbness.  Did not have any other major issue with chemotherapy or radiation therapy  September, 2016 Patient is here for further follow-up and treatment consideration As finished all radiation chemotherapy cystoscopy is being planned the next few weeks No hematuria.  No abdominal discomfort.  REVIEW  OF SYSTEMS:   Gen. status: Patient is alert oriented not in any acute distress.  No chills.  No fever.  HEENT: No headache.  Hard of hearing.  No soreness in the mouth. Lungs: No shortness of breath.  Dyspnea on exertion.  No cough.  No hemoptysis. Cardiac: No chest pain.  No palpitation patient does have hypertension Endocrine system: Patient does have diabetes GI: No nausea no vomiting no diarrhea of GU: No hematuria or dysuria or burning Musculoskeletal system chronic back pain and joint pains Lower  extremitywell being Skin: No rash Psychiatric system no significant problem  As per HPI. Otherwise, a complete review of systems is negatve.  PAST MEDICAL HISTORY: Past Medical History  Diagnosis Date  . Non-insulin dependent type 2 diabetes mellitus   . Arthritis   . Hypertension   . Sleep apnea   . B12 deficiency   . Dysrhythmia     Bradycardia    PAST SURGICAL HISTORY: Past Surgical History  Procedure Laterality Date  . Cyst on spine  1961  . Ear cyst excision    . Transurethral resection of bladder tumor N/A 10/24/2014    Procedure: TRANSURETHRAL RESECTION OF BLADDER TUMOR (TURBT);  Surgeon: Royston Cowper, MD;  Location: [facility] ORS;  Service: Urology;  Laterality: N/A;    FAMILY HISTORY Family History  Problem Relation Age of Onset  . Diabetes Neg Hx    no significant family history of colon cancer, ovarian cancer, breast cancer Mother died of Alzheimer's disease.  At age 52      ADVANCED DIRECTIVES: Patient does have advanced healthcare directive   HEALTH MAINTENANCE: Social History  Substance Use Topics  . Smoking status: Former Smoker    Quit date: 06/13/1976  . Smokeless tobacco: Never Used  . Alcohol Use: Yes     Comment: monthly 3 beers since xmas      No Known Allergies  Current Outpatient Prescriptions  Medication Sig Dispense Refill  . ciprofloxacin (CIPRO) 500 MG tablet Take 1 tablet (500 mg total) by mouth 2 (two) times daily. 14 tablet 0  . docusate sodium (COLACE) 100 MG capsule Take 2 capsules (200 mg total) by mouth 2 (two) times daily. 120 capsule 3  . furosemide (LASIX) 20 MG tablet Take 20 mg by mouth.    . Glucosamine HCl (GLUCOSAMINE PO) Take 1,500 mg by mouth 2 (two) times daily.     Marland Kitchen glyBURIDE (DIABETA) 5 MG tablet Take 5 mg by mouth daily with breakfast.    . hyoscyamine (LEVSIN/SL) 0.125 MG SL tablet Place 1 tablet (0.125 mg total) under the tongue every 4 (four) hours as needed. 1-2 TABS 40 tablet 3  . levofloxacin (LEVAQUIN)  500 MG tablet Take 1 tablet (500 mg total) by mouth daily. 7 tablet 0  . lovastatin (MEVACOR) 20 MG tablet     . lovastatin (MEVACOR) 40 MG tablet Take 40 mg by mouth at bedtime.    . metFORMIN (GLUCOPHAGE) 1000 MG tablet Take 500 mg by mouth 2 (two) times daily with a meal.     . metFORMIN (GLUCOPHAGE) 500 MG tablet     . Methen-Hyosc-Meth Blue-Na Phos (UROGESIC-BLUE) 81.6 MG TABS Take 1 tablet (81.6 mg total) by mouth every 6 (six) hours as needed. 40 tablet 3  . mirabegron ER (MYRBETRIQ) 25 MG TB24 tablet Take 1 tablet (25 mg total) by mouth daily. 30 tablet 5  . NUCYNTA 50 MG TABS tablet Take 1 tablet (50 mg total) by mouth every 6 (six)  hours as needed. 1 TO 2 TABS Q 6 HOURS PRN PAIN 30 tablet 0  . ondansetron (ZOFRAN) 8 MG tablet Take 1 tablet (8 mg total) by mouth 2 (two) times daily as needed. Start on the third day after chemotherapy. 30 tablet 1  . ramipril (ALTACE) 2.5 MG capsule Take 2.5 mg by mouth daily.    . saw palmetto 500 MG capsule Take 900 mg by mouth daily.    . tamsulosin (FLOMAX) 0.4 MG CAPS capsule     . vitamin B-12 (CYANOCOBALAMIN) 1000 MCG tablet Take 1,000 mcg by mouth daily.     No current facility-administered medications for this visit.    OBJECTIVE: PHYSICAL EXAM: No known status: Elderly individual not any acute distress HEENT: No abnormality detected.  No palpable neck masses.  Thyroid within normal limit Lungs: Emphysematous chest.  No rhonchi no rales Cardiac: Soft systolic murmur.  Regular heart sounds bradycardia Abdomen: Protuberant abdomen.  No ascites.  No palpable masses. Lower extremity no edema Skin: No rash Musculoskeletal system: Kyphoscoliosis Neurological system: Higher functions within normal limit.  Cranial nodes are intact.  No other localizing sign Lymphatic system: Supraclavicular, cervical, axillary, inguinal lymph nodes are not palpable   Filed Vitals:   02/27/15 1511  BP: 144/68  Pulse: 62  Temp: 96.6 F (35.9 C)     Body  mass index is 25.22 kg/(m^2).    ECOG FS:1 - Symptomatic but completely ambulatory  LAB RESULTS:  Appointment on 02/27/2015  Component Date Value Ref Range Status  . WBC 02/27/2015 8.5  3.8 - 10.6 K/uL Final  . RBC 02/27/2015 4.36* 4.40 - 5.90 MIL/uL Final  . Hemoglobin 02/27/2015 14.4  13.0 - 18.0 g/dL Final  . HCT 02/27/2015 42.6  40.0 - 52.0 % Final  . MCV 02/27/2015 97.7  80.0 - 100.0 fL Final  . MCH 02/27/2015 33.1  26.0 - 34.0 pg Final  . MCHC 02/27/2015 33.9  32.0 - 36.0 g/dL Final  . RDW 02/27/2015 17.1* 11.5 - 14.5 % Final  . Platelets 02/27/2015 176  150 - 440 K/uL Final  . Neutrophils Relative % 02/27/2015 67   Final  . Neutro Abs 02/27/2015 5.7  1.4 - 6.5 K/uL Final  . Lymphocytes Relative 02/27/2015 18   Final  . Lymphs Abs 02/27/2015 1.5  1.0 - 3.6 K/uL Final  . Monocytes Relative 02/27/2015 13   Final  . Monocytes Absolute 02/27/2015 1.1* 0.2 - 1.0 K/uL Final  . Eosinophils Relative 02/27/2015 1   Final  . Eosinophils Absolute 02/27/2015 0.1  0 - 0.7 K/uL Final  . Basophils Relative 02/27/2015 1   Final  . Basophils Absolute 02/27/2015 0.1  0 - 0.1 K/uL Final  . Sodium 02/27/2015 139  135 - 145 mmol/L Final  . Potassium 02/27/2015 4.3  3.5 - 5.1 mmol/L Final  . Chloride 02/27/2015 102  101 - 111 mmol/L Final  . CO2 02/27/2015 29  22 - 32 mmol/L Final  . Glucose, Bld 02/27/2015 235* 65 - 99 mg/dL Final  . BUN 02/27/2015 17  6 - 20 mg/dL Final  . Creatinine, Ser 02/27/2015 0.98  0.61 - 1.24 mg/dL Final  . Calcium 02/27/2015 8.5* 8.9 - 10.3 mg/dL Final  . Total Protein 02/27/2015 7.4  6.5 - 8.1 g/dL Final  . Albumin 02/27/2015 3.9  3.5 - 5.0 g/dL Final  . AST 02/27/2015 20  15 - 41 U/L Final  . ALT 02/27/2015 15* 17 - 63 U/L Final  . Alkaline Phosphatase 02/27/2015  72  38 - 126 U/L Final  . Total Bilirubin 02/27/2015 0.4  0.3 - 1.2 mg/dL Final  . GFR calc non Af Amer 02/27/2015 >60  >60 mL/min Final  . GFR calc Af Amer 02/27/2015 >60  >60 mL/min Final   Comment:  (NOTE) The eGFR has been calculated using the CKD EPI equation. This calculation has not been validated in all clinical situations. eGFR's persistently <60 mL/min signify possible Chronic Kidney Disease.   . Anion gap 02/27/2015 8  5 - 15 Final      ASSESSMENT:  Muscle invasive carcinoma bladder History of diabetes History of hypertension  PLAN Await the repeat cystoscopy.  Depending on that further planning of treatment  Bladder cancer   Staging form: Urinary Bladder, AJCC 7th Edition     Clinical: Stage II (T2, N0, M0) - Signed by Forest Gleason, MD on 11/15/2014   Forest Gleason, MD   03/04/2015 9:51 AM

## 2015-03-09 ENCOUNTER — Ambulatory Visit
Admission: RE | Admit: 2015-03-09 | Discharge: 2015-03-09 | Disposition: A | Discharge status: Home / Self Care | Payer: Medicare Other | Source: Ambulatory Visit | Attending: Radiation Oncology | Admitting: Radiation Oncology

## 2015-03-09 ENCOUNTER — Encounter: Payer: Self-pay | Admitting: Radiation Oncology

## 2015-03-09 ENCOUNTER — Other Ambulatory Visit: Payer: Self-pay | Admitting: *Deleted

## 2015-03-09 VITALS — BP 110/58 | HR 66 | Temp 96.7°F | Resp 18 | Wt 185.1 lb

## 2015-03-09 DIAGNOSIS — C679 Malignant neoplasm of bladder, unspecified: Secondary | ICD-10-CM

## 2015-03-09 NOTE — Progress Notes (Signed)
Radiation Oncology Follow up Note  Name: Herbert Phillips   Date:   03/09/2015 MRN:  [id number] DOB: 03/30/1926    This 81 y.o. male presents to the clinic today for follow-up for bladder cancer.  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 He is seen today in routine follow-up and is doing extremely well he specifically denies diarrhea dysuria or any other GI/GU complaints. Had cystoscopy about a week ago by urology showing according to the patient no evidence of disease..  COMPLICATIONS OF TREATMENT: none  FOLLOW UP COMPLIANCE: keeps appointments   PHYSICAL EXAM:  BP 110/58 mmHg  Pulse 66  Temp(Src) 96.7 F (35.9 C)  Resp 18  Wt 185 lb 1.2 oz (83.95 kg) Elderly gentleman in NAD.Well-developed well-nourished patient in NAD. HEENT reveals PERLA, EOMI, discs not visualized.  Oral cavity is clear. No oral mucosal lesions are identified. Neck is clear without evidence of cervical or supraclavicular adenopathy. Lungs are clear to A&P. Cardiac examination is essentially unremarkable with regular rate and rhythm without murmur rub or thrill. Abdomen is benign with no organomegaly or masses noted. Motor sensory and DTR levels are equal and symmetric in the upper and lower extremities. Cranial nerves II through XII are grossly intact. Proprioception is intact. No peripheral adenopathy or edema is identified. No motor or sensory levels are noted. Crude visual fields are within normal range.   RADIOLOGY RESULTS: No current films for review  PLAN: Present time patient is doing extremely well 1 month out external beam radiation therapy with no evidence of disease by cystoscopy. I'm please was overall progress. He continues close follow-up care  with medical oncology. I have asked to see her back in 4-5 months for follow-up. Patient knows to call sooner with any concerns.  I would like to take this opportunity for allowing me to participate in the care of your patient.Armstead Peaks., MD

## 2015-04-02 ENCOUNTER — Ambulatory Visit (INDEPENDENT_AMBULATORY_CARE_PROVIDER_SITE_OTHER): Payer: Medicare Other | Admitting: Podiatry

## 2015-04-02 DIAGNOSIS — B351 Tinea unguium: Secondary | ICD-10-CM

## 2015-04-02 DIAGNOSIS — Q828 Other specified congenital malformations of skin: Secondary | ICD-10-CM | POA: Diagnosis not present

## 2015-04-02 DIAGNOSIS — M79673 Pain in unspecified foot: Secondary | ICD-10-CM

## 2015-04-02 NOTE — Progress Notes (Signed)
He presents today with a chief complaint of painful elongated toenails.  Objective: Nails are thick yellow dystrophic with mycotic and painful palpation.                 Distal keratosis R 4th toe.  Assessment: Pain in limb secondary to onychomycosis 1 through 5 bilateral.  Plan: Debridement of nails 1 through 5 bilateral covered service secondary to pain.         Reduce distal corn 4 R.  3 mo.  Roselind Messier DPM

## 2015-04-10 ENCOUNTER — Inpatient Hospital Stay: Payer: Medicare Other

## 2015-04-10 ENCOUNTER — Inpatient Hospital Stay: Payer: Medicare Other | Attending: Oncology | Admitting: Oncology

## 2015-04-10 ENCOUNTER — Encounter: Payer: Self-pay | Admitting: Oncology

## 2015-04-10 ENCOUNTER — Other Ambulatory Visit: Payer: Self-pay | Admitting: Oncology

## 2015-04-10 VITALS — BP 113/60 | HR 58 | Temp 96.3°F | Wt 179.7 lb

## 2015-04-10 DIAGNOSIS — Z79899 Other long term (current) drug therapy: Secondary | ICD-10-CM | POA: Insufficient documentation

## 2015-04-10 DIAGNOSIS — I10 Essential (primary) hypertension: Secondary | ICD-10-CM

## 2015-04-10 DIAGNOSIS — M129 Arthropathy, unspecified: Secondary | ICD-10-CM | POA: Insufficient documentation

## 2015-04-10 DIAGNOSIS — E58 Dietary calcium deficiency: Secondary | ICD-10-CM | POA: Insufficient documentation

## 2015-04-10 DIAGNOSIS — C674 Malignant neoplasm of posterior wall of bladder: Secondary | ICD-10-CM

## 2015-04-10 DIAGNOSIS — Z87891 Personal history of nicotine dependence: Secondary | ICD-10-CM | POA: Insufficient documentation

## 2015-04-10 DIAGNOSIS — Z9221 Personal history of antineoplastic chemotherapy: Secondary | ICD-10-CM | POA: Diagnosis not present

## 2015-04-10 DIAGNOSIS — G4730 Sleep apnea, unspecified: Secondary | ICD-10-CM | POA: Insufficient documentation

## 2015-04-10 DIAGNOSIS — R001 Bradycardia, unspecified: Secondary | ICD-10-CM | POA: Insufficient documentation

## 2015-04-10 DIAGNOSIS — E119 Type 2 diabetes mellitus without complications: Secondary | ICD-10-CM | POA: Diagnosis not present

## 2015-04-10 DIAGNOSIS — Z923 Personal history of irradiation: Secondary | ICD-10-CM | POA: Insufficient documentation

## 2015-04-10 DIAGNOSIS — C679 Malignant neoplasm of bladder, unspecified: Secondary | ICD-10-CM

## 2015-04-10 DIAGNOSIS — Z8551 Personal history of malignant neoplasm of bladder: Secondary | ICD-10-CM | POA: Diagnosis present

## 2015-04-10 LAB — COMPREHENSIVE METABOLIC PANEL
ALT: 14 U/L — ABNORMAL LOW (ref 17–63)
ANION GAP: 5 (ref 5–15)
AST: 21 U/L (ref 15–41)
Albumin: 3.5 g/dL (ref 3.5–5.0)
Alkaline Phosphatase: 54 U/L (ref 38–126)
BUN: 23 mg/dL — ABNORMAL HIGH (ref 6–20)
CHLORIDE: 104 mmol/L (ref 101–111)
CO2: 24 mmol/L (ref 22–32)
Calcium: 7.8 mg/dL — ABNORMAL LOW (ref 8.9–10.3)
Creatinine, Ser: 0.97 mg/dL (ref 0.61–1.24)
GFR calc Af Amer: >60 mL/min (ref >60)
GFR calc non Af Amer: >60 mL/min (ref >60)
Glucose, Bld: 179 mg/dL — ABNORMAL HIGH (ref 65–99)
Potassium: 3.6 mmol/L (ref 3.5–5.1)
SODIUM: 133 mmol/L — AB (ref 135–145)
Total Bilirubin: 0.5 mg/dL (ref 0.3–1.2)
Total Protein: 7 g/dL (ref 6.5–8.1)

## 2015-04-10 LAB — CBC WITH DIFFERENTIAL/PLATELET
BASOS PCT: 0 %
Basophils Absolute: 0 10*3/uL (ref 0–0.1)
EOS ABS: 0.1 10*3/uL (ref 0–0.7)
EOS PCT: 1 %
HCT: 39.7 % — ABNORMAL LOW (ref 40.0–52.0)
Hemoglobin: 13.3 g/dL (ref 13.0–18.0)
LYMPHS ABS: 0.9 10*3/uL — AB (ref 1.0–3.6)
Lymphocytes Relative: 14 %
MCH: 32.3 pg (ref 26.0–34.0)
MCHC: 33.5 g/dL (ref 32.0–36.0)
MCV: 96.6 fL (ref 80.0–100.0)
MONOS PCT: 10 %
Monocytes Absolute: 0.6 10*3/uL (ref 0.2–1.0)
Neutro Abs: 5.1 10*3/uL (ref 1.4–6.5)
Neutrophils Relative %: 75 %
PLATELETS: 181 10*3/uL (ref 150–440)
RBC: 4.12 MIL/uL — ABNORMAL LOW (ref 4.40–5.90)
RDW: 13.4 % (ref 11.5–14.5)
WBC: 6.7 10*3/uL (ref 3.8–10.6)

## 2015-04-10 NOTE — Progress Notes (Signed)
[place] @ [facility] Telephone:(336) [phone]  Fax:(336) [phone]   INITIAL CONSULT  Herbert Phillips OB: 05-14-1926  MR#: [id number]  KZL#:[id number]  Patient Care Team: Albina Billet, MD as PCP - General (Internal Medicine) Royston Cowper, MD as Consulting Physician (Urology)  CHIEF COMPLAINT:  Chief Complaint  Patient presents with  . OTHER    VISIT DIAGNOSIS:   No diagnosis found.   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  Tumor was muscle invasion with lymphovascular invasion Patient  had a transurethral resection of bladder tumor (Oct 24, 2014) 3.patient will start cis-platinum and radiation therapy from June of 2016 4.  Patient has finished total 6 cycles of chemotherapy with cis-platinum on July 25.  And will finish radiation therapy on August 4 of 2016       Bladder cancer ([place])   10/24/2014 Initial Diagnosis Bladder cancer    Oncology Flowsheet 10/24/2014 12/11/2014 12/18/2014 12/27/2014 01/01/2015 01/08/2015 01/15/2015  Day, Cycle - Day 1, Cycle 1 Day 8, Cycle 1 Day 1, Cycle 2 Day 8, Cycle 2 Day 1, Cycle 3 Day 8, Cycle 3  CISplatin (PLATINOL) IV - 20 mg/m2 20 mg/m2 20 mg/m2 20 mg/m2 20 mg/m2 20 mg/m2  dexamethasone (DECADRON) IV - [ 12 mg ] [ 12 mg ] [ 12 mg ] [ 12 mg ] [ 12 mg ] [ 12 mg ]  fosaprepitant (EMEND) IV - [ 150 mg ] [ 150 mg ] [ 150 mg ] [ 150 mg ] [ 150 mg ] [ 150 mg ]  mitoMYcin (MUTAMYCIN) IV - - - - - - -  ondansetron (ZOFRAN) IV - - - - - - -  palonosetron (ALOXI) IV - 0.25 mg 0.25 mg 0.25 mg 0.25 mg 0.25 mg 0.25 mg    INTERVAL HISTORY: 81 year old gentleman had intermittent gross painless hematuria.  Started 4 weeks ago patient on underwent cystoscopy and CT scan.  Patient was supposed to have cystoscopy in April however felt weak  was admitted in the hospital had a cardiac evaluation no significant problem was found.  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  Patient had transurethral resection of bladder tumor and mitomycin was instilled. He was then referred to me for further evaluation and treatment planning. Patient is here for further follow-up.  He has finished all radiation chemotherapy. No hematuria no abdominal discomfort.  According to patient had cystoscopy done a month ago and there was no evidence of recurrent disease.  I do not have that report available for my review at present time.  There was no pathology sent. No tingling.  No numbness. Patient does not smoke  REVIEW OF SYSTEMS:   Gen. status: Patient is alert oriented not in any acute distress.  No chills.  No fever.  HEENT: No headache.  Hard of hearing.  No soreness in the mouth. Lungs: No shortness of breath.  Dyspnea on exertion.  No cough.  No hemoptysis. Cardiac: No chest pain.  No palpitation patient does have hypertension Endocrine system: Patient does have diabetes GI: No nausea no vomiting no diarrhea of GU: No hematuria or dysuria or burning Musculoskeletal system chronic back pain and joint pains Lower extremitywell being Skin: No rash Psychiatric system no significant problem  As per HPI. Otherwise, a complete review of systems is negatve.  PAST MEDICAL HISTORY: Past Medical History  Diagnosis Date  . Non-insulin dependent type 2 diabetes mellitus ([place])   . Arthritis   . Hypertension   . Sleep apnea   . B12 deficiency   . Dysrhythmia     Bradycardia    PAST SURGICAL HISTORY: Past Surgical History  Procedure Laterality Date  . Cyst on spine  1961  . Ear cyst excision    . Transurethral resection of bladder tumor N/A 10/24/2014    Procedure: TRANSURETHRAL RESECTION OF BLADDER TUMOR (TURBT);  Surgeon: Royston Cowper, MD;  Location: [facility] ORS;  Service: Urology;  Laterality: N/A;     FAMILY HISTORY Family History  Problem Relation Age of Onset  . Diabetes Neg Hx    no significant family history of colon cancer, ovarian cancer, breast cancer Mother died of Alzheimer's disease.  At age 76      ADVANCED DIRECTIVES: Patient does have advanced healthcare directive   HEALTH MAINTENANCE: Social History  Substance Use Topics  . Smoking status: Former Smoker    Quit date: 06/13/1976  . Smokeless tobacco: Never Used  . Alcohol Use: Yes     Comment: monthly 3 beers since xmas      No Known Allergies  Current Outpatient Prescriptions  Medication Sig Dispense Refill  . glyBURIDE (DIABETA) 5 MG tablet Take 5 mg by mouth daily with breakfast.    . lovastatin (MEVACOR) 20 MG tablet     . metFORMIN (GLUCOPHAGE) 500 MG tablet     . mirabegron ER (MYRBETRIQ) 25 MG TB24 tablet Take 1 tablet (25 mg total) by mouth daily. 30 tablet 5  . ramipril (ALTACE) 2.5 MG capsule Take 2.5 mg by mouth daily.    . tamsulosin (FLOMAX) 0.4 MG CAPS capsule     . vitamin B-12 (CYANOCOBALAMIN) 1000 MCG tablet Take 1,000 mcg by mouth daily.    Marland Kitchen aspirin EC 81 MG tablet Take by mouth.    . Glucosamine Sulfate (GLUCOSAMINE RELIEF) 1000 MG TABS Take by mouth.    . Nutritional Supplements (BLADDER 2.2) TABS Take by mouth.     No current facility-administered medications for this visit.    OBJECTIVE: PHYSICAL EXAM: No known status: Elderly individual not any acute distress HEENT: No abnormality detected.  No palpable neck masses.  Thyroid within normal limit Lungs: Emphysematous chest.  No rhonchi no rales Cardiac: Soft systolic murmur.  Regular heart sounds bradycardia Abdomen: Protuberant abdomen.  No ascites.  No palpable masses. Lower extremity no edema Skin: No rash Musculoskeletal system: Kyphoscoliosis Neurological system: Higher functions within normal limit.  Cranial nodes are intact.  No other localizing sign Lymphatic system: Supraclavicular, cervical, axillary, inguinal  lymph nodes are not palpable   Filed Vitals:   04/10/15 1512  BP: 113/60  Pulse: 58  Temp: 96.3 F (35.7 C)     Body mass index is 24.36 kg/(m^2).    ECOG FS:1 - Symptomatic but completely ambulatory  LAB RESULTS:  Appointment on 04/10/2015  Component Date Value Ref Range Status  . WBC 04/10/2015 6.7  3.8 - 10.6 K/uL Final  . RBC 04/10/2015 4.12* 4.40 - 5.90 MIL/uL Final  . Hemoglobin 04/10/2015 13.3  13.0 - 18.0 g/dL Final  . HCT 04/10/2015 39.7* 40.0 - 52.0 % Final  . MCV 04/10/2015 96.6  80.0 - 100.0 fL Final  . MCH 04/10/2015 32.3  26.0 - 34.0 pg Final  . MCHC 04/10/2015 33.5  32.0 - 36.0 g/dL Final  . RDW 04/10/2015 13.4  11.5 - 14.5 % Final  . Platelets 04/10/2015 181  150 - 440 K/uL Final  . Neutrophils Relative % 04/10/2015 75   Final  . Neutro Abs 04/10/2015 5.1  1.4 - 6.5 K/uL Final  . Lymphocytes Relative 04/10/2015 14   Final  . Lymphs Abs 04/10/2015 0.9* 1.0 - 3.6 K/uL Final  . Monocytes Relative 04/10/2015 10   Final  . Monocytes Absolute 04/10/2015 0.6  0.2 - 1.0 K/uL Final  . Eosinophils Relative 04/10/2015 1   Final  . Eosinophils Absolute 04/10/2015 0.1  0 - 0.7 K/uL Final  . Basophils Relative 04/10/2015 0   Final  . Basophils Absolute 04/10/2015 0.0  0 - 0.1 K/uL Final  . Sodium 04/10/2015 133* 135 - 145 mmol/L Final  . Potassium 04/10/2015 3.6  3.5 - 5.1 mmol/L Final  . Chloride 04/10/2015 104  101 - 111 mmol/L Final  . CO2 04/10/2015 24  22 - 32 mmol/L Final  . Glucose, Bld 04/10/2015 179* 65 - 99 mg/dL Final  . BUN 04/10/2015 23* 6 - 20 mg/dL Final  . Creatinine, Ser 04/10/2015 0.97  0.61 - 1.24 mg/dL Final  . Calcium 04/10/2015 7.8* 8.9 - 10.3 mg/dL Final  . Total Protein 04/10/2015 7.0  6.5 - 8.1 g/dL Final  . Albumin 04/10/2015 3.5  3.5 - 5.0 g/dL Final  . AST 04/10/2015 21  15 - 41 U/L Final  . ALT 04/10/2015 14* 17 - 63 U/L Final  . Alkaline Phosphatase 04/10/2015 54  38 - 126 U/L Final  . Total Bilirubin 04/10/2015 0.5  0.3 - 1.2 mg/dL Final   . GFR calc non Af Amer 04/10/2015 >60  >60 mL/min Final  . GFR calc Af Amer 04/10/2015 >60  >60 mL/min Final   Comment: (NOTE) The eGFR has been calculated using the CKD EPI equation. This calculation has not been validated in all clinical situations. eGFR's persistently <60 mL/min signify possible Chronic Kidney Disease.   . Anion gap 04/10/2015 5  5 - 15 Final      ASSESSMENT:  Muscle invasive carcinoma bladder History of diabetes History of hypertension Last cystoscopy report will be reviewed from Dr. Shelby Mattocks  Office On clinical examination there is no evidence of recurrent disease PLAN Continue observation and reevaluation in 6 month. Urine for cytology has been ordered and will be reviewed if it is abnormal we discussed possibility of CT scan and the cystoscopy  Bladder cancer   Staging form: Urinary Bladder, AJCC 7th Edition     Clinical: Stage II (T2, N0, M0) - Signed by Forest Gleason, MD on 11/15/2014   Forest Gleason, MD   04/10/2015 3:19 PM

## 2015-04-12 LAB — CYTOLOGY, URINE

## 2015-06-21 ENCOUNTER — Other Ambulatory Visit: Payer: Self-pay | Admitting: Nurse Practitioner

## 2015-07-02 ENCOUNTER — Ambulatory Visit: Payer: Medicare Other | Admitting: Podiatry

## 2015-07-02 ENCOUNTER — Ambulatory Visit: Payer: Medicare Other

## 2015-07-02 ENCOUNTER — Ambulatory Visit (INDEPENDENT_AMBULATORY_CARE_PROVIDER_SITE_OTHER): Payer: Medicare Other | Admitting: Podiatry

## 2015-07-02 ENCOUNTER — Encounter: Payer: Self-pay | Admitting: Podiatry

## 2015-07-02 DIAGNOSIS — Q828 Other specified congenital malformations of skin: Secondary | ICD-10-CM | POA: Diagnosis not present

## 2015-07-02 DIAGNOSIS — B351 Tinea unguium: Secondary | ICD-10-CM | POA: Diagnosis not present

## 2015-07-02 DIAGNOSIS — M79673 Pain in unspecified foot: Secondary | ICD-10-CM | POA: Diagnosis not present

## 2015-07-02 NOTE — Progress Notes (Signed)
He presents today with a chief complaint of painful elongated toenails and a painful callus to the plantar lateral aspect of the fourth digit right foot. Reactive hyperkeratosis aspect of the first metatarsophalangeal joint is also painful. He states that he is currently cancer free.  Objective: Vital signs stable alert and oriented 3. Pulses are strongly palpable. His toenails are thick yellow dystrophic onychomycotic. Porokeratotic lesion noted to plantar distal aspect of the fourth digit right foot. Debrided today no iatrogenic lesions. No signs of bacterial infection.  Assessment: Hammertoe deformities with onychomycosis and porokeratosis. Pain and limps a good onychomycosis.  Plan: Debridement of all mycotic nails as well as all reactive hyperkeratotic tissue. Follow-up with him in 3 months

## 2015-08-10 ENCOUNTER — Encounter: Payer: Self-pay | Admitting: Radiation Oncology

## 2015-08-10 ENCOUNTER — Ambulatory Visit
Admission: RE | Admit: 2015-08-10 | Discharge: 2015-08-10 | Disposition: A | Discharge status: Home / Self Care | Payer: Medicare Other | Source: Ambulatory Visit | Attending: Radiation Oncology | Admitting: Radiation Oncology

## 2015-08-10 VITALS — BP 119/65 | HR 58 | Temp 95.4°F | Resp 20 | Wt 187.2 lb

## 2015-08-10 DIAGNOSIS — C679 Malignant neoplasm of bladder, unspecified: Secondary | ICD-10-CM

## 2015-08-10 HISTORY — DX: Malignant neoplasm of bladder, unspecified: C67.9

## 2015-08-10 NOTE — Progress Notes (Signed)
Radiation Oncology Follow up Note  Name: Herbert Phillips   Date:   08/10/2015 MRN:  [id number] DOB: 07/12/1925    This 82 y.o. male presents to the clinic today for bladder cancer stage II (T2 1 N0 M0) status post chemotherapy and radiation therapy now out 6 months.  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 Muscularis propria and lamina propria were involved by cancer and lymphovascular invasion present. He underwent combined modality treatment with chemotherapy and radiation therapy. He is seen today in routine follow-up and is doing well. He specifically denies diarrhea dysuria or any other GI/GU complaints. Has very little urgency frequency and nocturia 1. Recently had cystoscopy by urology showing no evidence of disease.  COMPLICATIONS OF TREATMENT: none  FOLLOW UP COMPLIANCE: keeps appointments   PHYSICAL EXAM:  BP 119/65 mmHg  Pulse 58  Temp(Src) 95.4 F (35.2 C)  Resp 20  Wt 187 lb 2.7 oz (84.9 kg) Well-developed well-nourished patient in NAD. HEENT reveals PERLA, EOMI, discs not visualized.  Oral cavity is clear. No oral mucosal lesions are identified. Neck is clear without evidence of cervical or supraclavicular adenopathy. Lungs are clear to A&P. Cardiac examination is essentially unremarkable with regular rate and rhythm without murmur rub or thrill. Abdomen is benign with no organomegaly or masses noted. Motor sensory and DTR levels are equal and symmetric in the upper and lower extremities. Cranial nerves II through XII are grossly intact. Proprioception is intact. No peripheral adenopathy or edema is identified. No motor or sensory levels are noted. Crude visual fields are within normal range.  RADIOLOGY RESULTS: No current films for review  PLAN: At the present time he  is doing well with no evidence of disease by cystoscopy. I'm please was overall progress. Patient remains fairly asymptomatic. He continues close follow-up care with urology and medical oncology. I have asked to see him back in 1 year for follow-up. Would be happy to reevaluate him at any time should further radiation therapy be indicated.  I would like to take this opportunity for allowing me to participate in the care of your patient.Armstead Peaks., MD

## 2015-10-01 ENCOUNTER — Ambulatory Visit: Payer: Medicare Other | Admitting: Podiatry

## 2015-10-08 ENCOUNTER — Encounter: Payer: Self-pay | Admitting: Podiatry

## 2015-10-08 ENCOUNTER — Ambulatory Visit (INDEPENDENT_AMBULATORY_CARE_PROVIDER_SITE_OTHER): Payer: Medicare Other | Admitting: Podiatry

## 2015-10-08 DIAGNOSIS — B351 Tinea unguium: Secondary | ICD-10-CM | POA: Diagnosis not present

## 2015-10-08 DIAGNOSIS — M79673 Pain in unspecified foot: Secondary | ICD-10-CM

## 2015-10-08 DIAGNOSIS — Q828 Other specified congenital malformations of skin: Secondary | ICD-10-CM | POA: Diagnosis not present

## 2015-10-08 NOTE — Progress Notes (Signed)
He presents today with chief complaint of painful elongated toenails and a callus of the fourth digit right foot.  Objective: Vital signs are stable alert and oriented 3. Adductovarus rotated fourth digit right foot resulting in reactive hyperkeratosis is present. It is painful on palpation. His toenails are thick yellow dystrophic and clinically mycotic.  Assessment: Pain in limb secondary to onychomycosis and porokeratosis.  Plan: Debridement of all reactive hyperkeratosis and thick mycotic nails follow up with [place] in 3 months.

## 2015-10-09 ENCOUNTER — Inpatient Hospital Stay: Payer: Medicare Other | Admitting: Oncology

## 2015-10-09 ENCOUNTER — Inpatient Hospital Stay: Payer: Medicare Other

## 2015-10-26 ENCOUNTER — Ambulatory Visit: Payer: Medicare Other | Attending: Internal Medicine

## 2015-10-26 DIAGNOSIS — G4733 Obstructive sleep apnea (adult) (pediatric): Secondary | ICD-10-CM | POA: Insufficient documentation

## 2015-11-08 ENCOUNTER — Other Ambulatory Visit: Payer: Medicare Other

## 2015-11-08 ENCOUNTER — Ambulatory Visit: Payer: Medicare Other | Admitting: Oncology

## 2015-11-26 ENCOUNTER — Other Ambulatory Visit: Payer: Self-pay | Admitting: *Deleted

## 2015-11-26 DIAGNOSIS — C679 Malignant neoplasm of bladder, unspecified: Secondary | ICD-10-CM

## 2015-11-27 ENCOUNTER — Inpatient Hospital Stay: Payer: Medicare Other | Attending: Oncology

## 2015-11-27 ENCOUNTER — Inpatient Hospital Stay (HOSPITAL_BASED_OUTPATIENT_CLINIC_OR_DEPARTMENT_OTHER): Payer: Medicare Other | Admitting: Oncology

## 2015-11-27 ENCOUNTER — Encounter: Payer: Self-pay | Admitting: Oncology

## 2015-11-27 VITALS — BP 103/63 | HR 63 | Temp 96.1°F | Resp 18 | Wt 185.3 lb

## 2015-11-27 DIAGNOSIS — R001 Bradycardia, unspecified: Secondary | ICD-10-CM

## 2015-11-27 DIAGNOSIS — G4730 Sleep apnea, unspecified: Secondary | ICD-10-CM

## 2015-11-27 DIAGNOSIS — Z7982 Long term (current) use of aspirin: Secondary | ICD-10-CM

## 2015-11-27 DIAGNOSIS — Z8551 Personal history of malignant neoplasm of bladder: Secondary | ICD-10-CM | POA: Insufficient documentation

## 2015-11-27 DIAGNOSIS — Z87891 Personal history of nicotine dependence: Secondary | ICD-10-CM

## 2015-11-27 DIAGNOSIS — Z9221 Personal history of antineoplastic chemotherapy: Secondary | ICD-10-CM | POA: Diagnosis not present

## 2015-11-27 DIAGNOSIS — M129 Arthropathy, unspecified: Secondary | ICD-10-CM

## 2015-11-27 DIAGNOSIS — I10 Essential (primary) hypertension: Secondary | ICD-10-CM | POA: Insufficient documentation

## 2015-11-27 DIAGNOSIS — E538 Deficiency of other specified B group vitamins: Secondary | ICD-10-CM | POA: Insufficient documentation

## 2015-11-27 DIAGNOSIS — Z79899 Other long term (current) drug therapy: Secondary | ICD-10-CM | POA: Insufficient documentation

## 2015-11-27 DIAGNOSIS — Z923 Personal history of irradiation: Secondary | ICD-10-CM | POA: Insufficient documentation

## 2015-11-27 DIAGNOSIS — C679 Malignant neoplasm of bladder, unspecified: Secondary | ICD-10-CM

## 2015-11-27 DIAGNOSIS — E119 Type 2 diabetes mellitus without complications: Secondary | ICD-10-CM | POA: Diagnosis not present

## 2015-11-27 DIAGNOSIS — Z7984 Long term (current) use of oral hypoglycemic drugs: Secondary | ICD-10-CM | POA: Insufficient documentation

## 2015-11-27 LAB — CBC WITH DIFFERENTIAL/PLATELET
Basophils Absolute: 0.1 10*3/uL (ref 0–0.1)
Basophils Relative: 1 %
EOS PCT: 2 %
Eosinophils Absolute: 0.1 10*3/uL (ref 0–0.7)
HEMATOCRIT: 43 % (ref 40.0–52.0)
Hemoglobin: 14.8 g/dL (ref 13.0–18.0)
LYMPHS PCT: 17 %
Lymphs Abs: 1 10*3/uL (ref 1.0–3.6)
MCH: 32.4 pg (ref 26.0–34.0)
MCHC: 34.4 g/dL (ref 32.0–36.0)
MCV: 94.4 fL (ref 80.0–100.0)
MONO ABS: 0.6 10*3/uL (ref 0.2–1.0)
Monocytes Relative: 10 %
Neutro Abs: 4.4 10*3/uL (ref 1.4–6.5)
Neutrophils Relative %: 70 %
Platelets: 167 10*3/uL (ref 150–440)
RBC: 4.56 MIL/uL (ref 4.40–5.90)
RDW: 14.2 % (ref 11.5–14.5)
WBC: 6.2 10*3/uL (ref 3.8–10.6)

## 2015-11-27 LAB — COMPREHENSIVE METABOLIC PANEL
ALBUMIN: 3.9 g/dL (ref 3.5–5.0)
ALK PHOS: 54 U/L (ref 38–126)
ALT: 18 U/L (ref 17–63)
AST: 25 U/L (ref 15–41)
Anion gap: 7 (ref 5–15)
BILIRUBIN TOTAL: 0.5 mg/dL (ref 0.3–1.2)
BUN: 17 mg/dL (ref 6–20)
CO2: 27 mmol/L (ref 22–32)
CREATININE: 1.06 mg/dL (ref 0.61–1.24)
Calcium: 9 mg/dL (ref 8.9–10.3)
Chloride: 104 mmol/L (ref 101–111)
GFR calc Af Amer: >60 mL/min (ref >60)
GFR calc non Af Amer: >60 mL/min (ref >60)
GLUCOSE: 171 mg/dL — AB (ref 65–99)
POTASSIUM: 4.3 mmol/L (ref 3.5–5.1)
Sodium: 138 mmol/L (ref 135–145)
TOTAL PROTEIN: 7.1 g/dL (ref 6.5–8.1)

## 2015-11-27 NOTE — Progress Notes (Signed)
[place] @ [facility] [phone]  Fax:([phone]   INITIAL CONSULT  Herbert Phillips OB: 01-09-1926  MR#: [id number]  POL#:[id number]  Patient Care Team: Albina Billet, MD as PCP - General (Internal Medicine) Royston Cowper, MD as Consulting Physician (Urology)  CHIEF COMPLAINT:  Chief Complaint  Patient presents with  . Bladder Cancer    VISIT DIAGNOSIS:   No diagnosis found.   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  Tumor was muscle invasion with lymphovascular invasion Patient  had a transurethral resection of bladder tumor (Oct 24, 2014) 3.patient will start cis-platinum and radiation therapy from June of 2016 4.  Patient has finished total 6 cycles of chemotherapy with cis-platinum on July 25.  And will finish radiation therapy on August 4 of 2016       Bladder cancer [facility])   10/24/2014 Initial Diagnosis Bladder cancer      INTERVAL HISTORY: 82 year old gentleman had intermittent gross painless hematuria.  Started 4 weeks ago patient on underwent cystoscopy and CT scan.  Patient was supposed to have cystoscopy in April however felt weak was admitted in the hospital had a cardiac evaluation no significant problem was found.  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  Patient had transurethral resection of bladder tumor and mitomycin was instilled. He was then referred to me for further evaluation and treatment planning. Patient is here for further follow-up.  He has finished all radiation chemotherapy. No hematuria no abdominal discomfort.  According to patient had cystoscopy done a month ago and there was no evidence of recurrent disease.  I do not have that report available for my review at  present time.  There was no pathology sent. No tingling.  No numbness. Patient does not smoke Patient is here for ongoing evaluation and treatment consideration.  No hematuria.  Had a recent cystoscopy done 10 days ago verbal report negative for any malignancy REVIEW OF SYSTEMS:   Gen. status: Patient is alert oriented not in any acute distress.  No chills.  No fever.  HEENT: No headache.  Hard of hearing.  No soreness in the mouth. Lungs: No shortness of breath.  Dyspnea on exertion.  No cough.  No hemoptysis. Cardiac: No chest pain.  No palpitation patient does have hypertension Endocrine system: Patient does have diabetes GI: No nausea no vomiting no diarrhea of GU: No hematuria or dysuria or burning Musculoskeletal system chronic back pain and joint pains Lower extremitywell being Skin: No rash Psychiatric system no significant problem  As per HPI. Otherwise, a complete review of systems is negatve.  PAST MEDICAL HISTORY: Past Medical History  Diagnosis Date  . Non-insulin dependent type 2 diabetes mellitus ([place])   . Arthritis   . Hypertension   . Sleep apnea   . B12 deficiency   . Dysrhythmia     Bradycardia  . Bladder cancer ([place])     PAST SURGICAL HISTORY: Past Surgical History  Procedure Laterality Date  . Cyst on spine  1961  . Ear cyst excision    . Transurethral resection of bladder tumor N/A 10/24/2014    Procedure: TRANSURETHRAL RESECTION OF BLADDER TUMOR (TURBT);  Surgeon: Royston Cowper, MD;  Location: [facility] ORS;  Service: Urology;  Laterality: N/A;  FAMILY HISTORY Family History  Problem Relation Age of Onset  . Diabetes Neg Hx    no significant family history of colon cancer, ovarian cancer, breast cancer Mother died of Alzheimer's disease.  At age 90      ADVANCED DIRECTIVES: Patient does have advanced healthcare directive   HEALTH MAINTENANCE: Social History  Substance Use Topics  . Smoking status: Former Smoker    Quit date: 06/13/1976  .  Smokeless tobacco: Never Used  . Alcohol Use: Yes     Comment: monthly 3 beers since xmas      No Known Allergies  Current Outpatient Prescriptions  Medication Sig Dispense Refill  . allopurinol (ZYLOPRIM) 100 MG tablet Take by mouth.    Marland Kitchen aspirin EC 81 MG tablet Take by mouth.    . furosemide (LASIX) 20 MG tablet Take by mouth.    . Glucosamine Sulfate (GLUCOSAMINE RELIEF) 1000 MG TABS Take by mouth.    . glyBURIDE (DIABETA) 5 MG tablet Take 5 mg by mouth daily with breakfast.    . lovastatin (MEVACOR) 20 MG tablet     . metFORMIN (GLUCOPHAGE) 500 MG tablet     . mirabegron ER (MYRBETRIQ) 25 MG TB24 tablet Take 1 tablet (25 mg total) by mouth daily. 30 tablet 5  . Nutritional Supplements (BLADDER 2.2) TABS Take by mouth.    . ramipril (ALTACE) 2.5 MG capsule Take 2.5 mg by mouth daily.    . tamsulosin (FLOMAX) 0.4 MG CAPS capsule     . vitamin B-12 (CYANOCOBALAMIN) 1000 MCG tablet Take 1,000 mcg by mouth daily.     No current facility-administered medications for this visit.    OBJECTIVE: PHYSICAL EXAM: No known status: Elderly individual not any acute distress HEENT: No abnormality detected.  No palpable neck masses.  Thyroid within normal limit Lungs: Emphysematous chest.  No rhonchi no rales Cardiac: Soft systolic murmur.  Regular heart sounds bradycardia Abdomen: Protuberant abdomen.  No ascites.  No palpable masses. Lower extremity no edema Skin: No rash Musculoskeletal system: Kyphoscoliosis Neurological system: Higher functions within normal limit.  Cranial nodes are intact.  No other localizing sign Lymphatic system: Supraclavicular, cervical, axillary, inguinal lymph nodes are not palpable   Filed Vitals:   11/27/15 1341  BP: 103/63  Pulse: 63  Temp: 96.1 F (35.6 C)  Resp: 18     Body mass index is 25.13 kg/(m^2).    ECOG FS:1 - Symptomatic but completely ambulatory  LAB RESULTS:  Appointment on 11/27/2015  Component Date Value Ref Range Status  .  Sodium 11/27/2015 138  135 - 145 mmol/L Final  . Potassium 11/27/2015 4.3  3.5 - 5.1 mmol/L Final  . Chloride 11/27/2015 104  101 - 111 mmol/L Final  . CO2 11/27/2015 27  22 - 32 mmol/L Final  . Glucose, Bld 11/27/2015 171* 65 - 99 mg/dL Final  . BUN 11/27/2015 17  6 - 20 mg/dL Final  . Creatinine, Ser 11/27/2015 1.06  0.61 - 1.24 mg/dL Final  . Calcium 11/27/2015 9.0  8.9 - 10.3 mg/dL Final  . Total Protein 11/27/2015 7.1  6.5 - 8.1 g/dL Final  . Albumin 11/27/2015 3.9  3.5 - 5.0 g/dL Final  . AST 11/27/2015 25  15 - 41 U/L Final  . ALT 11/27/2015 18  17 - 63 U/L Final  . Alkaline Phosphatase 11/27/2015 54  38 - 126 U/L Final  . Total Bilirubin 11/27/2015 0.5  0.3 - 1.2 mg/dL Final  . GFR calc non Af Wyvonnia Lora  11/27/2015 >60  >60 mL/min Final  . GFR calc Af Amer 11/27/2015 >60  >60 mL/min Final   Comment: (NOTE) The eGFR has been calculated using the CKD EPI equation. This calculation has not been validated in all clinical situations. eGFR's persistently <60 mL/min signify possible Chronic Kidney Disease.   . Anion gap 11/27/2015 7  5 - 15 Final  . WBC 11/27/2015 6.2  3.8 - 10.6 K/uL Final  . RBC 11/27/2015 4.56  4.40 - 5.90 MIL/uL Final  . Hemoglobin 11/27/2015 14.8  13.0 - 18.0 g/dL Final  . HCT 11/27/2015 43.0  40.0 - 52.0 % Final  . MCV 11/27/2015 94.4  80.0 - 100.0 fL Final  . MCH 11/27/2015 32.4  26.0 - 34.0 pg Final  . MCHC 11/27/2015 34.4  32.0 - 36.0 g/dL Final  . RDW 11/27/2015 14.2  11.5 - 14.5 % Final  . Platelets 11/27/2015 167  150 - 440 K/uL Final  . Neutrophils Relative % 11/27/2015 70   Final  . Neutro Abs 11/27/2015 4.4  1.4 - 6.5 K/uL Final  . Lymphocytes Relative 11/27/2015 17   Final  . Lymphs Abs 11/27/2015 1.0  1.0 - 3.6 K/uL Final  . Monocytes Relative 11/27/2015 10   Final  . Monocytes Absolute 11/27/2015 0.6  0.2 - 1.0 K/uL Final  . Eosinophils Relative 11/27/2015 2   Final  . Eosinophils Absolute 11/27/2015 0.1  0 - 0.7 K/uL Final  . Basophils Relative  11/27/2015 1   Final  . Basophils Absolute 11/27/2015 0.1  0 - 0.1 K/uL Final      ASSESSMENT:  Muscle invasive carcinoma bladder History of diabetes History of hypertension Last cystoscopy report will be reviewed from Dr. Shelby Mattocks  Office. Verbal report was negative for any malignancy.   On clinical examination there is no evidence of recurrent disease PLAN Continue observation and reevaluation in 6 month. Review cystoscopy result reevaluation in 6 months consider another CT scan after that for evaluation  Bladder cancer   Staging form: Urinary Bladder, AJCC 7th Edition     Clinical: Stage II (T2, N0, M0) - Signed by Forest Gleason, MD on 11/15/2014   Forest Gleason, MD   11/27/2015 1:45 PM

## 2015-11-28 ENCOUNTER — Ambulatory Visit: Payer: Medicare Other | Admitting: Oncology

## 2015-11-28 ENCOUNTER — Other Ambulatory Visit: Payer: Medicare Other

## 2016-01-09 ENCOUNTER — Ambulatory Visit (INDEPENDENT_AMBULATORY_CARE_PROVIDER_SITE_OTHER): Payer: Medicare Other | Admitting: Podiatry

## 2016-01-09 ENCOUNTER — Encounter: Payer: Self-pay | Admitting: Podiatry

## 2016-01-09 DIAGNOSIS — B351 Tinea unguium: Secondary | ICD-10-CM | POA: Diagnosis not present

## 2016-01-09 DIAGNOSIS — Q828 Other specified congenital malformations of skin: Secondary | ICD-10-CM | POA: Diagnosis not present

## 2016-01-09 DIAGNOSIS — M79676 Pain in unspecified toe(s): Secondary | ICD-10-CM | POA: Diagnosis not present

## 2016-01-09 NOTE — Progress Notes (Signed)
He presents today with a chief complaint of painful elongated toenails.  Objective: Vital signs are stable he is alert and oriented 3. Pulses are palpable. Nails are thick yellow dystrophic with mycotic and painful palpation.  Assessment: Pain in limb secondary to onychomycosis.  Plan: Debridement of toenails 1 through 5 bilateral.

## 2016-02-04 ENCOUNTER — Emergency Department: Payer: Medicare Other

## 2016-02-04 ENCOUNTER — Inpatient Hospital Stay: Payer: Medicare Other

## 2016-02-04 ENCOUNTER — Inpatient Hospital Stay: Payer: Medicare Other | Admitting: Certified Registered Nurse Anesthetist

## 2016-02-04 ENCOUNTER — Inpatient Hospital Stay
Admission: EM | Admit: 2016-02-04 | Discharge: 2016-02-07 | DRG: 482 | Disposition: A | Discharge status: Skilled Nursing Facility | Payer: Medicare Other | Attending: Internal Medicine | Admitting: Internal Medicine

## 2016-02-04 ENCOUNTER — Encounter
Admission: EM | Disposition: A | Discharge status: Skilled Nursing Facility | Payer: Self-pay | Source: Home / Self Care | Attending: Internal Medicine

## 2016-02-04 DIAGNOSIS — Z79899 Other long term (current) drug therapy: Secondary | ICD-10-CM | POA: Diagnosis not present

## 2016-02-04 DIAGNOSIS — Z87891 Personal history of nicotine dependence: Secondary | ICD-10-CM | POA: Diagnosis not present

## 2016-02-04 DIAGNOSIS — Y9353 Activity, golf: Secondary | ICD-10-CM

## 2016-02-04 DIAGNOSIS — S72002A Fracture of unspecified part of neck of left femur, initial encounter for closed fracture: Secondary | ICD-10-CM

## 2016-02-04 DIAGNOSIS — D649 Anemia, unspecified: Secondary | ICD-10-CM | POA: Diagnosis not present

## 2016-02-04 DIAGNOSIS — G4730 Sleep apnea, unspecified: Secondary | ICD-10-CM | POA: Diagnosis not present

## 2016-02-04 DIAGNOSIS — W010XXA Fall on same level from slipping, tripping and stumbling without subsequent striking against object, initial encounter: Secondary | ICD-10-CM | POA: Diagnosis not present

## 2016-02-04 DIAGNOSIS — W19XXXA Unspecified fall, initial encounter: Secondary | ICD-10-CM

## 2016-02-04 DIAGNOSIS — Z8551 Personal history of malignant neoplasm of bladder: Secondary | ICD-10-CM | POA: Diagnosis not present

## 2016-02-04 DIAGNOSIS — M1990 Unspecified osteoarthritis, unspecified site: Secondary | ICD-10-CM | POA: Diagnosis not present

## 2016-02-04 DIAGNOSIS — S72002B Fracture of unspecified part of neck of left femur, initial encounter for open fracture type I or II: Secondary | ICD-10-CM

## 2016-02-04 DIAGNOSIS — S51012A Laceration without foreign body of left elbow, initial encounter: Secondary | ICD-10-CM | POA: Diagnosis not present

## 2016-02-04 DIAGNOSIS — D72829 Elevated white blood cell count, unspecified: Secondary | ICD-10-CM | POA: Diagnosis present

## 2016-02-04 DIAGNOSIS — Z7982 Long term (current) use of aspirin: Secondary | ICD-10-CM

## 2016-02-04 DIAGNOSIS — S7222XA Displaced subtrochanteric fracture of left femur, initial encounter for closed fracture: Principal | ICD-10-CM | POA: Diagnosis present

## 2016-02-04 DIAGNOSIS — Y92838 Other recreation area as the place of occurrence of the external cause: Secondary | ICD-10-CM | POA: Diagnosis not present

## 2016-02-04 DIAGNOSIS — E119 Type 2 diabetes mellitus without complications: Secondary | ICD-10-CM | POA: Diagnosis not present

## 2016-02-04 DIAGNOSIS — I10 Essential (primary) hypertension: Secondary | ICD-10-CM | POA: Diagnosis not present

## 2016-02-04 DIAGNOSIS — Z7984 Long term (current) use of oral hypoglycemic drugs: Secondary | ICD-10-CM | POA: Diagnosis not present

## 2016-02-04 HISTORY — DX: Fracture of unspecified part of neck of left femur, initial encounter for closed fracture: S72.002A

## 2016-02-04 HISTORY — PX: INTRAMEDULLARY (IM) NAIL INTERTROCHANTERIC: SHX5875

## 2016-02-04 LAB — URINALYSIS COMPLETE WITH MICROSCOPIC (ARMC ONLY)
BILIRUBIN URINE: NEGATIVE
Bacteria, UA: NONE SEEN
GLUCOSE, UA: NEGATIVE mg/dL
HGB URINE DIPSTICK: NEGATIVE
Leukocytes, UA: NEGATIVE
NITRITE: NEGATIVE
Protein, ur: NEGATIVE mg/dL
SPECIFIC GRAVITY, URINE: 1.018 (ref 1.005–1.030)
Squamous Epithelial / LPF: NONE SEEN
pH: 5 (ref 5.0–8.0)

## 2016-02-04 LAB — SURGICAL PCR SCREEN
MRSA, PCR: NEGATIVE
Staphylococcus aureus: NEGATIVE

## 2016-02-04 LAB — GLUCOSE, CAPILLARY
GLUCOSE-CAPILLARY: 165 mg/dL — AB (ref 65–99)
GLUCOSE-CAPILLARY: 200 mg/dL — AB (ref 65–99)
Glucose-Capillary: 173 mg/dL — ABNORMAL HIGH (ref 65–99)

## 2016-02-04 SURGERY — FIXATION, FRACTURE, INTERTROCHANTERIC, WITH INTRAMEDULLARY ROD
Anesthesia: Spinal | Laterality: Left

## 2016-02-04 MED ORDER — CEFAZOLIN SODIUM-DEXTROSE 2-4 GM/100ML-% IV SOLN
2.0000 g | INTRAVENOUS | Status: DC
  Filled 2016-02-04: qty 100

## 2016-02-04 MED ORDER — OXYCODONE HCL 5 MG PO TABS: 5.0000 mg | ORAL_TABLET | ORAL | Status: DC | PRN

## 2016-02-04 MED ORDER — FLEET ENEMA 7-19 GM/118ML RE ENEM: 1.0000 | ENEMA | Freq: Once | RECTAL | Status: DC | PRN

## 2016-02-04 MED ORDER — SODIUM CHLORIDE 0.9 % IV SOLN
INTRAVENOUS | Status: DC
  Administered 2016-02-04: 18:00:00 via INTRAVENOUS

## 2016-02-04 MED ORDER — OXYCODONE HCL 5 MG PO TABS
5.0000 mg | ORAL_TABLET | ORAL | Status: DC | PRN
  Administered 2016-02-05: 10 mg via ORAL
  Administered 2016-02-05: 5 mg via ORAL
  Filled 2016-02-04: qty 1
  Filled 2016-02-04: qty 2

## 2016-02-04 MED ORDER — NEOMYCIN-POLYMYXIN B GU 40-200000 IR SOLN
Status: DC | PRN
  Administered 2016-02-04: 2 mL

## 2016-02-04 MED ORDER — INSULIN ASPART 100 UNIT/ML ~~LOC~~ SOLN
0.0000 [IU] | Freq: Three times a day (TID) | SUBCUTANEOUS | Status: DC
  Administered 2016-02-04 – 2016-02-05 (×2): 2 [IU] via SUBCUTANEOUS
  Administered 2016-02-05 – 2016-02-06 (×2): 3 [IU] via SUBCUTANEOUS
  Administered 2016-02-06: 2 [IU] via SUBCUTANEOUS
  Administered 2016-02-06: 3 [IU] via SUBCUTANEOUS
  Administered 2016-02-07: 5 [IU] via SUBCUTANEOUS
  Administered 2016-02-07: 3 [IU] via SUBCUTANEOUS
  Filled 2016-02-04: qty 3
  Filled 2016-02-04 (×2): qty 2
  Filled 2016-02-04 (×2): qty 3
  Filled 2016-02-04: qty 4
  Filled 2016-02-04: qty 2
  Filled 2016-02-04 (×2): qty 3

## 2016-02-04 MED ORDER — ONDANSETRON HCL 4 MG PO TABS: 4.0000 mg | ORAL_TABLET | Freq: Four times a day (QID) | ORAL | Status: DC | PRN

## 2016-02-04 MED ORDER — ACETAMINOPHEN 325 MG PO TABS: 650.0000 mg | ORAL_TABLET | Freq: Four times a day (QID) | ORAL | Status: DC | PRN

## 2016-02-04 MED ORDER — POTASSIUM CHLORIDE IN NACL 20-0.9 MEQ/L-% IV SOLN
INTRAVENOUS | Status: DC
  Administered 2016-02-05: 01:00:00 via INTRAVENOUS
  Filled 2016-02-04 (×3): qty 1000

## 2016-02-04 MED ORDER — PANTOPRAZOLE SODIUM 40 MG PO TBEC
40.0000 mg | DELAYED_RELEASE_TABLET | Freq: Every day | ORAL | Status: DC
  Administered 2016-02-05 – 2016-02-07 (×3): 40 mg via ORAL
  Filled 2016-02-04 (×3): qty 1

## 2016-02-04 MED ORDER — MIDAZOLAM HCL 5 MG/5ML IJ SOLN
INTRAMUSCULAR | Status: DC | PRN
  Administered 2016-02-04 (×4): 0.5 mg via INTRAVENOUS

## 2016-02-04 MED ORDER — ONDANSETRON HCL 4 MG/2ML IJ SOLN: 4.0000 mg | Freq: Four times a day (QID) | INTRAMUSCULAR | Status: DC | PRN

## 2016-02-04 MED ORDER — MAGNESIUM HYDROXIDE 400 MG/5ML PO SUSP: 30.0000 mL | Freq: Every day | ORAL | Status: DC | PRN

## 2016-02-04 MED ORDER — BISACODYL 10 MG RE SUPP
10.0000 mg | Freq: Every day | RECTAL | Status: DC | PRN
  Administered 2016-02-06: 10 mg via RECTAL
  Filled 2016-02-04: qty 1

## 2016-02-04 MED ORDER — POLYETHYLENE GLYCOL 3350 17 G PO PACK
17.0000 g | PACK | Freq: Every day | ORAL | Status: DC | PRN
  Administered 2016-02-05: 17 g via ORAL
  Filled 2016-02-04: qty 1

## 2016-02-04 MED ORDER — ALLOPURINOL 100 MG PO TABS
100.0000 mg | ORAL_TABLET | Freq: Every day | ORAL | Status: DC
  Administered 2016-02-05 – 2016-02-07 (×3): 100 mg via ORAL
  Filled 2016-02-04 (×3): qty 1

## 2016-02-04 MED ORDER — EPHEDRINE SULFATE 50 MG/ML IJ SOLN
INTRAMUSCULAR | Status: DC | PRN
  Administered 2016-02-04 (×2): 10 mg via INTRAVENOUS

## 2016-02-04 MED ORDER — BUPIVACAINE-EPINEPHRINE (PF) 0.5% -1:200000 IJ SOLN
INTRAMUSCULAR | Status: DC | PRN
  Administered 2016-02-04: 30 mL via PERINEURAL

## 2016-02-04 MED ORDER — DOCUSATE SODIUM 100 MG PO CAPS: 100.0000 mg | ORAL_CAPSULE | Freq: Two times a day (BID) | ORAL | Status: DC

## 2016-02-04 MED ORDER — PHENYLEPHRINE HCL 10 MG/ML IJ SOLN
INTRAMUSCULAR | Status: DC | PRN
  Administered 2016-02-04: 100 ug via INTRAVENOUS

## 2016-02-04 MED ORDER — SODIUM CHLORIDE 0.9 % IV SOLN
INTRAVENOUS | Status: DC | PRN
  Administered 2016-02-04: 25 ug/min via INTRAVENOUS

## 2016-02-04 MED ORDER — METOCLOPRAMIDE HCL 10 MG PO TABS: 5.0000 mg | ORAL_TABLET | Freq: Three times a day (TID) | ORAL | Status: DC | PRN

## 2016-02-04 MED ORDER — TAMSULOSIN HCL 0.4 MG PO CAPS
0.4000 mg | ORAL_CAPSULE | Freq: Every day | ORAL | Status: DC
  Administered 2016-02-05 – 2016-02-07 (×3): 0.4 mg via ORAL
  Filled 2016-02-04 (×3): qty 1

## 2016-02-04 MED ORDER — SODIUM CHLORIDE 0.9 % IV SOLN
INTRAVENOUS | Status: DC | PRN
Start: 2016-02-04 — End: 2016-02-04
  Administered 2016-02-04: 20:00:00 via INTRAVENOUS

## 2016-02-04 MED ORDER — MIRABEGRON ER 25 MG PO TB24
25.0000 mg | ORAL_TABLET | Freq: Every day | ORAL | Status: DC
  Administered 2016-02-05 – 2016-02-07 (×3): 25 mg via ORAL
  Filled 2016-02-04 (×3): qty 1

## 2016-02-04 MED ORDER — ACETAMINOPHEN 500 MG PO TABS
1000.0000 mg | ORAL_TABLET | Freq: Once | ORAL | Status: AC
  Administered 2016-02-04: 1000 mg via ORAL
  Filled 2016-02-04: qty 2

## 2016-02-04 MED ORDER — ENOXAPARIN SODIUM 40 MG/0.4ML ~~LOC~~ SOLN
40.0000 mg | SUBCUTANEOUS | Status: DC
Start: 2016-02-05 — End: 2016-02-07
  Administered 2016-02-05 – 2016-02-07 (×3): 40 mg via SUBCUTANEOUS
  Filled 2016-02-04 (×3): qty 0.4

## 2016-02-04 MED ORDER — ACETAMINOPHEN 325 MG PO TABS
650.0000 mg | ORAL_TABLET | Freq: Four times a day (QID) | ORAL | Status: DC | PRN
  Administered 2016-02-07: 650 mg via ORAL
  Filled 2016-02-04: qty 2

## 2016-02-04 MED ORDER — INSULIN ASPART 100 UNIT/ML ~~LOC~~ SOLN
0.0000 [IU] | Freq: Every day | SUBCUTANEOUS | Status: DC
  Administered 2016-02-05 – 2016-02-06 (×2): 2 [IU] via SUBCUTANEOUS
  Filled 2016-02-04 (×2): qty 2

## 2016-02-04 MED ORDER — ACETAMINOPHEN 650 MG RE SUPP: 650.0000 mg | Freq: Four times a day (QID) | RECTAL | Status: DC | PRN

## 2016-02-04 MED ORDER — DOCUSATE SODIUM 100 MG PO CAPS
100.0000 mg | ORAL_CAPSULE | Freq: Two times a day (BID) | ORAL | Status: DC
  Administered 2016-02-04 – 2016-02-07 (×6): 100 mg via ORAL
  Filled 2016-02-04 (×7): qty 1

## 2016-02-04 MED ORDER — ACETAMINOPHEN 500 MG PO TABS
1000.0000 mg | ORAL_TABLET | Freq: Four times a day (QID) | ORAL | Status: AC
  Administered 2016-02-04 – 2016-02-05 (×4): 1000 mg via ORAL
  Filled 2016-02-04 (×4): qty 2

## 2016-02-04 MED ORDER — FENTANYL CITRATE (PF) 100 MCG/2ML IJ SOLN: 25.0000 ug | INTRAMUSCULAR | Status: DC | PRN

## 2016-02-04 MED ORDER — PROPOFOL 10 MG/ML IV BOLUS
INTRAVENOUS | Status: DC | PRN
  Administered 2016-02-04: 30 mg via INTRAVENOUS

## 2016-02-04 MED ORDER — VITAMIN B-12 1000 MCG PO TABS
1000.0000 ug | ORAL_TABLET | Freq: Every day | ORAL | Status: DC
  Administered 2016-02-05 – 2016-02-07 (×3): 1000 ug via ORAL
  Filled 2016-02-04 (×3): qty 1

## 2016-02-04 MED ORDER — BACITRACIN ZINC 500 UNIT/GM EX OINT
TOPICAL_OINTMENT | Freq: Two times a day (BID) | CUTANEOUS | Status: DC
  Administered 2016-02-04 – 2016-02-07 (×5): 1 via TOPICAL
  Filled 2016-02-04 (×5): qty 0.9
  Filled 2016-02-04: qty 28.35
  Filled 2016-02-04 (×2): qty 0.9

## 2016-02-04 MED ORDER — TETANUS-DIPHTH-ACELL PERTUSSIS 5-2.5-18.5 LF-MCG/0.5 IM SUSP
0.5000 mL | Freq: Once | INTRAMUSCULAR | Status: AC
  Administered 2016-02-04: 0.5 mL via INTRAMUSCULAR
  Filled 2016-02-04: qty 0.5

## 2016-02-04 MED ORDER — LACTATED RINGERS IV SOLN: INTRAVENOUS | Status: DC | PRN

## 2016-02-04 MED ORDER — DIPHENHYDRAMINE HCL 12.5 MG/5ML PO ELIX: 12.5000 mg | ORAL_SOLUTION | ORAL | Status: DC | PRN

## 2016-02-04 MED ORDER — PRAVASTATIN SODIUM 20 MG PO TABS
20.0000 mg | ORAL_TABLET | Freq: Every day | ORAL | Status: DC
  Administered 2016-02-05 – 2016-02-06 (×2): 20 mg via ORAL
  Filled 2016-02-04 (×2): qty 1

## 2016-02-04 MED ORDER — METOCLOPRAMIDE HCL 5 MG/ML IJ SOLN: 5.0000 mg | Freq: Three times a day (TID) | INTRAMUSCULAR | Status: DC | PRN

## 2016-02-04 MED ORDER — LACTATED RINGERS IV SOLN
INTRAVENOUS | Status: DC | PRN
  Administered 2016-02-04: 20:00:00 via INTRAVENOUS

## 2016-02-04 MED ORDER — CEFAZOLIN SODIUM-DEXTROSE 2-4 GM/100ML-% IV SOLN
2.0000 g | Freq: Four times a day (QID) | INTRAVENOUS | Status: AC
  Administered 2016-02-05 (×3): 2 g via INTRAVENOUS
  Filled 2016-02-04 (×3): qty 100

## 2016-02-04 MED ORDER — ONDANSETRON HCL 4 MG/2ML IJ SOLN: 4.0000 mg | Freq: Once | INTRAMUSCULAR | Status: DC | PRN

## 2016-02-04 MED ORDER — FENTANYL CITRATE (PF) 100 MCG/2ML IJ SOLN
INTRAMUSCULAR | Status: DC | PRN
  Administered 2016-02-04 (×3): 25 ug via INTRAVENOUS

## 2016-02-04 MED ORDER — MORPHINE SULFATE (PF) 2 MG/ML IV SOLN: 2.0000 mg | INTRAVENOUS | Status: DC | PRN

## 2016-02-04 MED ORDER — CEFAZOLIN SODIUM-DEXTROSE 2-3 GM-% IV SOLR
INTRAVENOUS | Status: DC | PRN
  Administered 2016-02-04: 2 g via INTRAVENOUS

## 2016-02-04 MED ORDER — HYDROMORPHONE HCL 1 MG/ML IJ SOLN: 0.5000 mg | INTRAMUSCULAR | Status: DC | PRN

## 2016-02-04 SURGICAL SUPPLY — 44 items
BALL NOSE GUIDE WIRE IMPLANT
BNDG COHESIVE 4X5 TAN STRL (GAUZE/BANDAGES/DRESSINGS) ×3 IMPLANT
BNDG COHESIVE 6X5 TAN STRL LF (GAUZE/BANDAGES/DRESSINGS) ×3 IMPLANT
CANISTER SUCT 1200ML W/VALVE (MISCELLANEOUS) ×3 IMPLANT
CHLORAPREP W/TINT 26ML (MISCELLANEOUS) ×3 IMPLANT
CORTICAL BONE SCR 5.0MM X 48MM (Screw) ×3 IMPLANT
CPRTOCAL BONE SCREW IMPLANT
DRAPE C-ARMOR (DRAPES) ×3 IMPLANT
DRAPE SHEET LG 3/4 BI-LAMINATE (DRAPES) ×3 IMPLANT
DRILL 4.3MM IMPLANT
DRSG OPSITE POSTOP 3X4 (GAUZE/BANDAGES/DRESSINGS) ×3 IMPLANT
DRSG OPSITE POSTOP 4X6 (GAUZE/BANDAGES/DRESSINGS) IMPLANT
ELECT CAUTERY BLADE 6.4 (BLADE) ×3 IMPLANT
ELECT REM PT RETURN 9FT ADLT (ELECTROSURGICAL) ×3
ELECTRODE REM PT RTRN 9FT ADLT (ELECTROSURGICAL) ×1 IMPLANT
GAUZE SPONGE 4X4 12PLY STRL (GAUZE/BANDAGES/DRESSINGS) ×3 IMPLANT
GLOVE BIO SURGEON STRL SZ8 (GLOVE) ×6 IMPLANT
GLOVE INDICATOR 8.0 STRL GRN (GLOVE) ×6 IMPLANT
GOWN STRL REUS W/ TWL LRG LVL3 (GOWN DISPOSABLE) ×1 IMPLANT
GOWN STRL REUS W/ TWL XL LVL3 (GOWN DISPOSABLE) ×1 IMPLANT
GOWN STRL REUS W/TWL LRG LVL3 (GOWN DISPOSABLE) ×2
GOWN STRL REUS W/TWL XL LVL3 (GOWN DISPOSABLE) ×2
GUIDEPIN VERSANAIL DSP 3.2X444 ×3 IMPLANT
GUIDEWIRE BALL NOSE 100CM (WIRE) ×3 IMPLANT
HIP FR NAIL LAG SCREW 10.5X110 (Orthopedic Implant) ×3 IMPLANT
HIP FRACTURE NAIL IMPLANT
LAG SCREW IMPLANT
MAT BLUE FLOOR 46X72 FLO (MISCELLANEOUS) ×3 IMPLANT
NAIL HIP FRACT LT 130D 11X400 (Nail) ×3 IMPLANT
NEEDLE FILTER BLUNT 18X 1/2SAF (NEEDLE) ×2
NEEDLE FILTER BLUNT 18X1 1/2 (NEEDLE) ×1 IMPLANT
NEEDLE HYPO 22GX1.5 SAFETY (NEEDLE) ×3 IMPLANT
NS IRRIG 500ML POUR BTL (IV SOLUTION) ×3 IMPLANT
PACK HIP COMPR (MISCELLANEOUS) ×3 IMPLANT
SCREW CORTICL BON 5.0MM X 48MM (Screw) ×1 IMPLANT
SCREW LAG HIP FR NAIL 10.5X110 (Orthopedic Implant) ×1 IMPLANT
STAPLER SKIN PROX 35W (STAPLE) ×3 IMPLANT
STRAP SAFETY BODY (MISCELLANEOUS) ×3 IMPLANT
SUT VIC AB 1 CT1 36 (SUTURE) ×3 IMPLANT
SUT VIC AB 2-0 CT1 (SUTURE) ×6 IMPLANT
SYR 30ML LL (SYRINGE) ×3 IMPLANT
SYRINGE 10CC LL (SYRINGE) ×3 IMPLANT
TAPE MICROFOAM 4IN (TAPE) ×3 IMPLANT
THREADED GUIDE PIN IMPLANT

## 2016-02-04 NOTE — Transfer of Care (Signed)
Immediate Anesthesia Transfer of Care Note  Patient: Herbert Phillips  Procedure(s) Performed: Procedure(s): INTRAMEDULLARY (IM) NAIL INTERTROCHANTRIC (Left)  Patient Location: PACU  Anesthesia Type:Spinal  Level of Consciousness: awake, alert , oriented and patient cooperative  Airway & Oxygen Therapy: Patient Spontanous Breathing  Post-op Assessment: Report given to RN  Post vital signs: Reviewed and stable  Last Vitals:  Vitals:   02/04/16 1615 02/04/16 1919  BP: (!) 129/49 (!) 112/55  Pulse: 70 77  Resp: 18 19  Temp: 36.8 C 36.7 C    Last Pain:  Vitals:   02/04/16 1919  TempSrc: Oral  PainSc:          Complications: No apparent anesthesia complications

## 2016-02-04 NOTE — Op Note (Signed)
02/04/2016  9:56 PM  Patient:   Herbert Phillips  Pre-Op Diagnosis:   Displaced comminuted subtrochanteric left hip fracture.  Post-Op Diagnosis:   Same.  Procedure:   Reduction and internal fixation of displaced comminuted subtrochanteric left hip fracture with Biomet Affyxis TFN nail.  Surgeon:   Pascal Lux, MD  Assistant:   None  Anesthesia:   Spinal  Findings:   As above  Complications:   None  EBL:   75 cc  Fluids:   1000 cc crystalloid  UOP:   200 cc  TT:   None  Drains:   None  Closure:   Staples  Implants:   Biomet Affyxis 11 x 400 mm TFN with a 110 mm lag screw and a 48 mm distal interlocking screw  Brief Clinical Note:   The patient is an 82 year old male who sustained the above-noted injury earlier today when he slipped and fell, landing on his left hip. X-rays in the emergency room demonstrated the above-noted fracture. The patient has been cleared medically and presents at this time for reduction and internal fixation of the displaced subtrochanteric left hip fracture.  Procedure:   The patient was brought into the operating room. After adequate spinal anesthesia was obtained, the patient was lain in the supine position on the fracture table. The uninjured leg was placed in a flexed and abducted position while the injured lower extremity was placed in longitudinal traction. The fracture was reduced using longitudinal traction and internal rotation. The adequacy of reduction was verified fluoroscopically in AP and lateral projections and found to be near anatomic. The lateral aspects of the left hip and thigh were prepped with ChloraPrep solution before being draped sterilely. Preoperative antibiotics were administered. The greater trochanter was identified fluoroscopically and an approximately 3 cm incision made about 2-3 fingerbreadths above the tip of the greater trochanter. The incision was carried down through the subcutaneous tissues to expose the  gluteal fascia. This was split the length of the incision, providing access to the tip of the trochanter. Under fluoroscopic guidance, a guidewire was drilled through the tip of the trochanter into the proximal metaphysis to the level of the lesser trochanter. After verifying its position fluoroscopically in AP and lateral projections, it was overreamed with the initial reamer to the depth of the lesser trochanter. A guidewire was passed down through the femoral canal to the supracondylar region. The adequacy of guidewire position was verified fluoroscopically in AP and lateral projections before the length of the guidewire within the canal was measured and found to be 400 mm. It was overreamed sequentially using the flexible reamers, beginning with a 10 mm reamer and progressing to a 12.5 mm reamer. This provided good cortical chatter. The 11 x 400 mm Biomet Affyxis TFN rod was selected and advanced to the appropriate depth, as verified fluoroscopically. The guide system for the lag screw was positioned and advanced through an approximately 2 cm stab incision over the lateral aspect of the proximal femur. The guidewire was drilled up through the trochanteric femoral nail and into the femoral neck to rest within 5 mm of subchondral bone. After verifying its position in the femoral neck and head in both AP and lateral projections, the guidewire was measured and found to be optimally replicated by a [id number] mm lag screw. The guidewire was overreamed to the appropriate depth before the lag screw was inserted and advanced to the appropriate depth as verified fluoroscopically in AP and lateral projections. The locking screw  was advanced, then backed off a quarter turn to set the lag screw. Again the adequacy of hardware position and fracture reduction was verified fluoroscopically in AP and lateral projections and found to be excellent.  Attention was directed distally. Using the "perfect circle" technique, the leg and  fluoroscopy machine were positioned appropriately. An approximate 1.5 cm stab incision was made over the skin at the appropriate point before the drill bit was advanced through the cortex and across the static hole of the nail. The appropriate length of the screw was determined before the 48 mm distal interlocking screw was positioned, then advanced and tightened securely. Again the adequacy of screw position was verified fluoroscopically in AP and lateral projections and found to be excellent.  The wounds were irrigated thoroughly with sterile saline solution before the deeper subcutaneous tissues were closed using 2-0 Vicryl interrupted sutures. The skin was closed using staples. Sterile occlusive dressings were applied to all wounds before the patient was transferred back to his hospital bed. The patient was then transferred to the recovery room in satisfactory condition after tolerating the procedure well.

## 2016-02-04 NOTE — ED Provider Notes (Signed)
[facility] Emergency Department Provider Note  ____________________________________________  Time seen: Approximately 1:02 PM  I have reviewed the triage vital signs and the nursing notes.   HISTORY  Chief Complaint Leg Injury   HPI Herbert Phillips is a 82 y.o. male history of arthritis, B12 deficiency, hypertension, bradycardia, and non-insulin-dependent type 2 diabetes who presents for evaluation of left hip pain status post mechanical fall. Patient reports that he came home from playing golf and was standing up trying to clean grass from his shoe when change issues when he lost his balance and fell onto his car. He try to hold onto the car but then fell onto his buttock. He reports that he hit his head on the car but no LOC. He was unable to stand up and is complaining of pain on his left hip. Patient reports that the pain is mild but worse with movement, located in his left hip, nonradiating. He reports that laying in bed the pain is 0. He is not on any blood thinners. He denies any dizziness, palpitations, chest pain, shortness of breath preceding the fall. He denies history of syncope. He currently denies neck pain, headache, chest pain, shortness of breath, abdominal pain, upper extremity pain, right lower extremity pain, back pain.  Past Medical History:  Diagnosis Date  . Arthritis   . B12 deficiency   . Bladder cancer ([place])   . Dysrhythmia    Bradycardia  . Hip fracture, left ([place]) 02/04/2016  . Hypertension   . Non-insulin dependent type 2 diabetes mellitus ([place])   . Sleep apnea     Patient Active Problem List   Diagnosis Date Noted  . Closed left hip fracture ([place]) 02/04/2016  . Bladder cancer ([place]) 10/24/2014  . Diabetes ([place]) 09/14/2013  . Dyslipidemia 09/14/2013  . BP (high blood pressure) 09/14/2013  . Obstructive apnea 09/14/2013  . Diabetes mellitus ([place]) 09/14/2013    Past Surgical History:  Procedure Laterality Date  .  cyst on spine  1961  . EAR CYST EXCISION    . TRANSURETHRAL RESECTION OF BLADDER TUMOR N/A 10/24/2014   Procedure: TRANSURETHRAL RESECTION OF BLADDER TUMOR (TURBT);  Surgeon: Royston Cowper, MD;  Location: [facility] ORS;  Service: Urology;  Laterality: N/A;    Prior to Admission medications   Medication Sig Start Date End Date Taking? Authorizing Provider  allopurinol (ZYLOPRIM) 100 MG tablet Take 100 mg by mouth daily.  04/25/15 04/24/16 Yes Historical Provider, MD  aspirin EC 81 MG tablet Take 81 mg by mouth daily.    Yes Historical Provider, MD  furosemide (LASIX) 20 MG tablet Take 20 mg by mouth daily as needed.  04/13/15 04/12/16 Yes Historical Provider, MD  Glucosamine Sulfate (GLUCOSAMINE RELIEF) 1000 MG TABS Take 1,000 mg by mouth daily.    Yes Historical Provider, MD  glyBURIDE (DIABETA) 5 MG tablet Take 5 mg by mouth daily with breakfast.   Yes Historical Provider, MD  lovastatin (MEVACOR) 20 MG tablet Take 20 mg by mouth daily at 6 PM.  12/28/14  Yes Historical Provider, MD  metFORMIN (GLUCOPHAGE) 500 MG tablet Take 500 mg by mouth 2 (two) times daily with a meal.  12/13/14  Yes Historical Provider, MD  mirabegron ER (MYRBETRIQ) 25 MG TB24 tablet Take 1 tablet (25 mg total) by mouth daily. 01/23/15  Yes Noreene Filbert, MD  Nutritional Supplements (BLADDER 2.2) TABS Take 1 tablet by mouth daily.    Yes Historical Provider, MD  ramipril (ALTACE) 2.5 MG capsule  Take 2.5 mg by mouth daily.   Yes Historical Provider, MD  tamsulosin (FLOMAX) 0.4 MG CAPS capsule Take 0.4 mg by mouth daily.  11/08/14  Yes Historical Provider, MD  vitamin B-12 (CYANOCOBALAMIN) 1000 MCG tablet Take 1,000 mcg by mouth daily.   Yes Historical Provider, MD    Allergies Review of patient's allergies indicates no known allergies.  Family History  Problem Relation Age of Onset  . Diabetes Neg Hx     Social History Social History  Substance Use Topics  . Smoking status: Former Smoker    Quit date: 06/13/1976  .  Smokeless tobacco: Never Used  . Alcohol use Yes     Comment: monthly 3 beers since xmas    Review of Systems Constitutional: Negative for fever. Eyes: Negative for visual changes. ENT: Negative for facial injury or neck injury Cardiovascular: Negative for chest injury. Respiratory: Negative for shortness of breath. Negative for chest wall injury. Gastrointestinal: Negative for abdominal pain or injury. Genitourinary: Negative for dysuria. Musculoskeletal: Negative for back injury, + Left hip pain. Skin: Negative for laceration/abrasions. Neurological: Negative for head injury.   ____________________________________________   PHYSICAL EXAM:  VITAL SIGNS: Vitals:   02/04/16 1615 02/04/16 1919  BP: (!) 129/49 (!) 112/55  Pulse: 70 77  Resp: 18 19  Temp: 98.2 F (36.8 C) 98.1 F (36.7 C)   Constitutional: Alert and oriented. No acute distress. Does not appear intoxicated. HEENT Head: Normocephalic and atraumatic. Face: No facial bony tenderness. Stable midface Ears: No hemotympanum bilaterally. No Battle sign Eyes: No eye injury. PERRL. No raccoon eyes Nose: Nontender. No epistaxis. No rhinorrhea Mouth/Throat: Mucous membranes are moist. No oropharyngeal blood. No dental injury. Airway patent without stridor. Normal voice. Neck: no C-collar in place. No midline c-spine tenderness.  Cardiovascular: Normal rate, regular rhythm. Normal and symmetric distal pulses are present in all extremities. Pulmonary/Chest: Chest wall is stable and nontender to palpation/compression. Normal respiratory effort. Breath sounds are normal. No crepitus.  Abdominal: Soft, nontender, non distended. Musculoskeletal: LLE is externally rotated and shortened with tenderness with internal and external rotation. L knee is swollen but with painless ROM. Nontender with normal full range of motion in all other extremities. No deformities. No thoracic or lumbar midline spinal tenderness. Pelvis is  stable. Skin: skin abrasion and ecchymosis of the L elbow.  Psychiatric: Speech and behavior are appropriate. Neurological: Normal speech and language. Moves all extremities to command. No gross focal neurologic deficits are appreciated.  Glascow Coma Score: 4 - Opens eyes on own 6 - Follows simple motor commands 5 - Alert and oriented GCS: 15   ____________________________________________   LABS (all labs ordered are listed, but only abnormal results are displayed)  Labs Reviewed  URINALYSIS COMPLETEWITH MICROSCOPIC ([place]) - Abnormal; Notable for the following:       Result Value   Color, Urine YELLOW (*)    APPearance CLEAR (*)    Ketones, ur TRACE (*)    All other components within normal limits  GLUCOSE, CAPILLARY - Abnormal; Notable for the following:    Glucose-Capillary 173 (*)    All other components within normal limits  SURGICAL PCR SCREEN  CBC  BASIC METABOLIC PANEL   ____________________________________________  EKG  none  ____________________________________________  RADIOLOGY  CT head: No acute intracranial abnormality. Stable moderate cerebral atrophy and mild chronic small vessel disease.  No evidence of acute cervical spine fracture or subluxation. Degenerative spondylosis, as described above.   XR hip: Comminuted fracture proximal left femoral  diaphysis with displaced major fracture fragments. There is avulsion of the lesser trochanter on the left. No other fractures. No dislocation. Mild symmetric narrowing both hip joints.   CXR: No active disease.   XR left knee: Areas of osteoarthritic change.  No fracture or joint effusion ____________________________________________   PROCEDURES  Procedure(s) performed: None Procedures Critical Care performed:  None ____________________________________________   INITIAL IMPRESSION / ASSESSMENT AND PLAN / ED COURSE   82 y.o. male history of arthritis, B12 deficiency, hypertension,  bradycardia, and non-insulin-dependent type 2 diabetes who presents for evaluation of left hip pain and shortened externally rotated LLE status post mechanical fall. Patient is well-appearing, has a shortened externally rotated left lower extremity with tenderness on his left hip concerning for a hip fracture. We'll get x-rays. Also has a swollen left knee with painless range of motion, we'll get x-rays. Patient has ecchymosis and skin tears over his left elbow. We'll clean the tears, apply topical bacitracin, and dressing. We'll get x-rays of the elbow to rule out fracture. We'll also renew patient's tetanus immunization. Patient hit his head therefore will get a head CT and CT cervical spine.  Clinical Course  Comment By Time  XR showing left hip fracture. Will page ortho. Patient refused imaging of L elbow as he has no pain and full ROM.  Rudene Re, MD 08/14 1431   Patient admitted to [facility] for OR today.   Pertinent labs & imaging results that were available during my care of the patient were reviewed by me and considered in my medical decision making (see chart for details).    ____________________________________________   FINAL CLINICAL IMPRESSION(S) / ED DIAGNOSES  Final diagnoses:  Fall  Closed left hip fracture, initial encounter ([place])  Skin tear of elbow without complication, left, initial encounter      NEW MEDICATIONS STARTED DURING THIS VISIT:  Current Discharge Medication List       Note:  This document was prepared using Dragon voice recognition software and may include unintentional dictation errors.    Rudene Re, MD 02/04/16 2010

## 2016-02-04 NOTE — Consult Note (Signed)
ORTHOPAEDIC CONSULTATION  REQUESTING PHYSICIAN: Lytle Butte, MD  Chief Complaint:   Left hip pain.  History of Present Illness: Herbert Phillips is a 82 y.o. male with a history of diabetes, hypertension, bladder cancer, sleep apnea, and vitamin B12 deficiency who lives independently with his wife. Apparently, he had finished round of golf this morning. When he got home, he was trying to lean against the car to take off his shoes so that he would not track grass and dirt into the house. He apparently lost his balance and fell onto his left hip. He was unable to get up, so EMS was called and he was brought to the emergency room where x-rays demonstrated a comminuted subtrochanteric fracture of his left hip. The patient denies any associated injuries as a result of the fall, and denies any lightheadedness, dizziness, chest pain, shortness breath, or other symptoms that may have precipitated his fall.  Past Medical History:  Diagnosis Date  . Arthritis   . B12 deficiency   . Bladder cancer ([place])   . Dysrhythmia    Bradycardia  . Hip fracture, left ([place]) 02/04/2016  . Hypertension   . Non-insulin dependent type 2 diabetes mellitus ([place])   . Sleep apnea    Past Surgical History:  Procedure Laterality Date  . cyst on spine  1961  . EAR CYST EXCISION    . TRANSURETHRAL RESECTION OF BLADDER TUMOR N/A 10/24/2014   Procedure: TRANSURETHRAL RESECTION OF BLADDER TUMOR (TURBT);  Surgeon: Royston Cowper, MD;  Location: [facility] ORS;  Service: Urology;  Laterality: N/A;   Social History   Social History  . Marital status: Married    Spouse name: N/A  . Number of children: N/A  . Years of education: N/A   Social History Main Topics  . Smoking status: Former Smoker    Quit date: 06/13/1976  . Smokeless tobacco: Never Used  . Alcohol use Yes     Comment: monthly 3 beers since xmas  . Drug use: No  . Sexual activity: Not Asked    Other Topics Concern  . None   Social History Narrative  . None   Family History  Problem Relation Age of Onset  . Diabetes Neg Hx    No Known Allergies Prior to Admission medications   Medication Sig Start Date End Date Taking? Authorizing Provider  allopurinol (ZYLOPRIM) 100 MG tablet Take 100 mg by mouth daily.  04/25/15 04/24/16 Yes Historical Provider, MD  aspirin EC 81 MG tablet Take 81 mg by mouth daily.    Yes Historical Provider, MD  furosemide (LASIX) 20 MG tablet Take 20 mg by mouth daily as needed.  04/13/15 04/12/16 Yes Historical Provider, MD  Glucosamine Sulfate (GLUCOSAMINE RELIEF) 1000 MG TABS Take 1,000 mg by mouth daily.    Yes Historical Provider, MD  glyBURIDE (DIABETA) 5 MG tablet Take 5 mg by mouth daily with breakfast.   Yes Historical Provider, MD  lovastatin (MEVACOR) 20 MG tablet Take 20 mg by mouth daily at 6 PM.  12/28/14  Yes Historical Provider, MD  metFORMIN (GLUCOPHAGE) 500 MG tablet Take 500 mg by mouth 2 (two) times daily with a meal.  12/13/14  Yes Historical Provider, MD  mirabegron ER (MYRBETRIQ) 25 MG TB24 tablet Take 1 tablet (25 mg total) by mouth daily. 01/23/15  Yes Noreene Filbert, MD  Nutritional Supplements (BLADDER 2.2) TABS Take 1 tablet by mouth daily.    Yes Historical Provider, MD  ramipril (ALTACE) 2.5 MG capsule Take  2.5 mg by mouth daily.   Yes Historical Provider, MD  tamsulosin (FLOMAX) 0.4 MG CAPS capsule Take 0.4 mg by mouth daily.  11/08/14  Yes Historical Provider, MD  vitamin B-12 (CYANOCOBALAMIN) 1000 MCG tablet Take 1,000 mcg by mouth daily.   Yes Historical Provider, MD   Dg Chest 1 View  Result Date: 02/04/2016 CLINICAL DATA:  Femur fracture today. Preoperative respiratory exam. EXAM: CHEST 1 VIEW COMPARISON:  10/05/2014 FINDINGS: Heart size is normal. Mediastinal shadows are normal. The lungs are clear. No effusions. No acute bony finding. IMPRESSION: No active disease. Electronically Signed   By: Nelson Chimes M.D.   On:  02/04/2016 14:43   Dg Knee 1-2 Views Left  Result Date: 02/04/2016 CLINICAL DATA:  Pain following fall EXAM: LEFT KNEE - 1-2 VIEW COMPARISON:  None. FINDINGS: Frontal and lateral views were obtained. There is no acute fracture or dislocation. No joint effusion. There is narrowing medially and in the patellofemoral joint region. Spurs arise from the anterior patella. No erosive change. IMPRESSION: Areas of osteoarthritic change.  No fracture or joint effusion. Electronically Signed   By: Lowella Grip III M.D.   On: 02/04/2016 14:06   Ct Head Wo Contrast  Result Date: 02/04/2016 CLINICAL DATA:  Fall today.  Head and neck injury and pain. EXAM: CT HEAD WITHOUT CONTRAST CT CERVICAL SPINE WITHOUT CONTRAST TECHNIQUE: Multidetector CT imaging of the head and cervical spine was performed following the standard protocol without intravenous contrast. Multiplanar CT image reconstructions of the cervical spine were also generated. COMPARISON:  Head CT on 10/05/2014 FINDINGS: CT HEAD FINDINGS There is no evidence of intracranial hemorrhage, brain edema, or other signs of acute infarction. There is no evidence of intracranial mass lesion or mass effect. No abnormal extraaxial fluid collections are identified. Moderate diffuse cerebral atrophy and mild chronic small vessel disease are stable. Ventricles are stable in size. No evidence of skull fracture or pneumocephalus. CT CERVICAL SPINE FINDINGS No evidence of acute fracture, subluxation, or prevertebral soft tissue swelling. Mild degenerative disc disease seen at levels of C5-6 and C6-7. Moderate to severe facet DJD seen mainly on the left from levels of C3 -C6. IMPRESSION: No acute intracranial abnormality. Stable moderate cerebral atrophy and mild chronic small vessel disease. No evidence of acute cervical spine fracture or subluxation. Degenerative spondylosis, as described above. Electronically Signed   By: Earle Gell M.D.   On: 02/04/2016 14:16   Dg Hip  Unilat With Pelvis 2-3 Views Left  Result Date: 02/04/2016 CLINICAL DATA:  Pain following fall EXAM: DG HIP (WITH OR WITHOUT PELVIS) 2-3V LEFT COMPARISON:  None. FINDINGS: Frontal pelvis well as frontal and lateral left hip images were obtained. There is a fracture of the proximal left femoral diaphysis, primarily inferior to the intertrochanteric region but with avulsion of the lesser trochanter. There is medial displacement of distal major fracture fragments with respect to the major proximal fragment. No other areas of fracture. No dislocation. There is mild symmetric narrowing of both hip joints. No erosive change. IMPRESSION: Comminuted fracture proximal left femoral diaphysis with displaced major fracture fragments. There is avulsion of the lesser trochanter on the left. No other fractures. No dislocation. Mild symmetric narrowing both hip joints. Electronically Signed   By: Lowella Grip III M.D.   On: 02/04/2016 14:05    Positive ROS: All other systems have been reviewed and were otherwise negative with the exception of those mentioned in the HPI and as above.  Physical Exam: General:  Alert,  no acute distress Psychiatric:  Patient is competent for consent with normal mood and affect   Cardiovascular:  No pedal edema Respiratory:  No wheezing, non-labored breathing GI:  Abdomen is soft and non-tender Skin:  No lesions in the area of chief complaint Neurologic:  Sensation intact distally Lymphatic:  No axillary or cervical lymphadenopathy  Orthopedic Exam:  Orthopedic examination is limited to the left hip and lower extremity. Skin inspection around the left hip is unremarkable. There is no swelling, erythema, ecchymosis, or abrasions. The left lower extremity is somewhat shortened and externally rotated as compared to the right. He has pain with any attempt at active or passive motion of the left hip or lower extremity. He is able to actively dorsiflex and plantar flex his toes and  ankle. He is neurovascularly intact to his left foot.  X-rays:  X-rays of the pelvis and left hip are available for review. The findings are as described above. There is a comminuted subtrochanteric fracture of the left hip with a large butterfly fragment. No significant degenerative changes are noted. No other abnormalities are identified.  Assessment: Comminuted subtrochanteric fracture left hip.  Plan: The treatment options are discussed with the patient and his family who are at the bedside, including both surgical and nonsurgical options. The patient would like to proceed with surgical intervention to include intramedullary nailing of the left subtrochanteric hip fracture. This procedure has been discussed in detail with the patient, as have the potential risks (including bleeding, infection, nerve and/or blood vessel injury, persistent or recurrent pain, stiffness, malunion and/or nonunion, leg length inequality, need for further surgery, blood clots, strokes, heart attacks and/or arrhythmias, etc.) and benefits. The patient states his understanding and wishes to proceed. A formal written consent will be obtained by the nursing staff.  Thank you for asking me to participate in the care of this most pleasant man. I will be happy to follow him with you.   Pascal Lux, MD  Beeper #:  [phone]  02/04/2016 5:54 PM

## 2016-02-04 NOTE — ED Triage Notes (Signed)
Pt bib EMSw/ c/o fall and hip injury.  Pt sts denies LOC or dizziness, sts that he was leaning and lost balance. Pt sts he did hit head, no chgs in vision or pain in head. Shortening and rotation noted to L leg.  Sensation and movement intact to L leg/foot.  Skin tear noted to L elbow.  Pt alert, oriented, denies CP/SOB.  NAD.

## 2016-02-04 NOTE — H&P (Signed)
[facility] [place] NAME: Herbert Phillips    MR#:  [id number]  DATE OF BIRTH:  11/02/1925   DATE OF ADMISSION:  02/04/2016  PRIMARY CARE PHYSICIAN: Albina Billet, MD   REQUESTING/REFERRING PHYSICIAN: Alfred Levins  CHIEF COMPLAINT:   Chief Complaint  Patient presents with  . Leg Injury    HISTORY OF PRESENT ILLNESS:  Herbert Phillips  is a 82 y.o. male with a known history of Type 2 diabetes non-insulin-requiring who is presenting after mechanical fall. Patient states he finished a round of golf shoes were wet he fell injuring his left leg. He stated immediate pain in the left hip described only as "pain" 8/10 intensity no worsening or relieving factors. He does state however he is unable to move that left leg at this time. He denies any prior chest pain palpitation shortness of breath further symptomatology at this time.  PAST MEDICAL HISTORY:   Past Medical History:  Diagnosis Date  . Arthritis   . B12 deficiency   . Bladder cancer ([place])   . Dysrhythmia    Bradycardia  . Hypertension   . Non-insulin dependent type 2 diabetes mellitus ([place])   . Sleep apnea     PAST SURGICAL HISTORY:   Past Surgical History:  Procedure Laterality Date  . cyst on spine  1961  . EAR CYST EXCISION    . TRANSURETHRAL RESECTION OF BLADDER TUMOR N/A 10/24/2014   Procedure: TRANSURETHRAL RESECTION OF BLADDER TUMOR (TURBT);  Surgeon: Royston Cowper, MD;  Location: [facility] ORS;  Service: Urology;  Laterality: N/A;    SOCIAL HISTORY:   Social History  Substance Use Topics  . Smoking status: Former Smoker    Quit date: 06/13/1976  . Smokeless tobacco: Never Used  . Alcohol use Yes     Comment: monthly 3 beers since xmas    FAMILY HISTORY:   Family History  Problem Relation Age of Onset  . Diabetes Neg Hx     DRUG ALLERGIES:  No Known Allergies  REVIEW OF SYSTEMS:  REVIEW OF SYSTEMS:  CONSTITUTIONAL: Denies fevers, chills, fatigue, weakness.    EYES: Denies blurred vision, double vision, or eye pain.  EARS, NOSE, THROAT: Denies tinnitus, ear pain, hearing loss.  RESPIRATORY: denies cough, shortness of breath, wheezing  CARDIOVASCULAR: Denies chest pain, palpitations, edema.  GASTROINTESTINAL: Denies nausea, vomiting, diarrhea, abdominal pain.  GENITOURINARY: Denies dysuria, hematuria.  ENDOCRINE: Denies nocturia or thyroid problems. HEMATOLOGIC AND LYMPHATIC: Denies easy bruising or bleeding.  SKIN: Denies rash or lesions.  MUSCULOSKELETAL: Denies pain in neck, back, shoulder, knees, , or further arthritic symptoms. Positive hip pain stated above NEUROLOGIC: Denies paralysis, paresthesias.  PSYCHIATRIC: Denies anxiety or depressive symptoms. Otherwise full review of systems performed by me is negative.   MEDICATIONS AT HOME:   Prior to Admission medications   Medication Sig Start Date End Date Taking? Authorizing Provider  allopurinol (ZYLOPRIM) 100 MG tablet Take by mouth. 04/25/15 04/24/16  Historical Provider, MD  aspirin EC 81 MG tablet Take by mouth.    Historical Provider, MD  furosemide (LASIX) 20 MG tablet Take by mouth. 04/13/15 04/12/16  Historical Provider, MD  Glucosamine Sulfate (GLUCOSAMINE RELIEF) 1000 MG TABS Take by mouth.    Historical Provider, MD  glyBURIDE (DIABETA) 5 MG tablet Take 5 mg by mouth daily with breakfast.    Historical Provider, MD  lovastatin (MEVACOR) 20 MG tablet  12/28/14   Historical Provider, MD  metFORMIN (GLUCOPHAGE) 500 MG tablet  12/13/14   Historical Provider, MD  mirabegron ER (MYRBETRIQ) 25 MG TB24 tablet Take 1 tablet (25 mg total) by mouth daily. 01/23/15   Noreene Filbert, MD  Nutritional Supplements (BLADDER 2.2) TABS Take by mouth.    Historical Provider, MD  ramipril (ALTACE) 2.5 MG capsule Take 2.5 mg by mouth daily.    Historical Provider, MD  tamsulosin (FLOMAX) 0.4 MG CAPS capsule  11/08/14   Historical Provider, MD  vitamin B-12 (CYANOCOBALAMIN) 1000 MCG tablet Take 1,000 mcg  by mouth daily.    Historical Provider, MD      VITAL SIGNS:  Blood pressure 136/74, pulse 66, resp. rate 18, height 6' (1.829 m), weight 79.4 kg (175 lb), SpO2 96 %.  PHYSICAL EXAMINATION:  VITAL SIGNS: Vitals:   02/04/16 1302  BP: 136/74  Pulse: 66  Resp: 18   GENERAL:82 y.o.male currently in no acute distress.  HEAD: Normocephalic, atraumatic.  EYES: Pupils equal, round, reactive to light. Extraocular muscles intact. No scleral icterus.  MOUTH: Moist mucosal membrane. Dentition intact. No abscess noted.  EAR, NOSE, THROAT: Clear without exudates. No external lesions.  NECK: Supple. No thyromegaly. No nodules. No JVD.  PULMONARY: Clear to ascultation, without wheeze rails or rhonci. No use of accessory muscles, Good respiratory effort. good air entry bilaterally CHEST: Nontender to palpation.  CARDIOVASCULAR: S1 and S2. Regular rate and rhythm. No murmurs, rubs, or gallops. No edema. Pedal pulses 2+ bilaterally.  GASTROINTESTINAL: Soft, nontender, nondistended. No masses. Positive bowel sounds. No hepatosplenomegaly.  MUSCULOSKELETAL: Left lower extremity shortened and rotated with apparent swelling around left hip otherwise No further swelling, clubbing, or edema. Range of motion full in all extremities.  NEUROLOGIC: Cranial nerves II through XII are intact. No gross focal neurological deficits. Sensation intact. Reflexes intact.  SKIN: No ulceration, lesions, rashes, or cyanosis. Skin warm and dry. Turgor intact.  PSYCHIATRIC: Mood, affect within normal limits. The patient is awake, alert and oriented x 3. Insight, judgment intact.    LABORATORY PANEL:   CBC No results for input(s): WBC, HGB, HCT, PLT in the last 168 hours. ------------------------------------------------------------------------------------------------------------------  Chemistries  No results for input(s): NA, K, CL, CO2, GLUCOSE, BUN, CREATININE, CALCIUM, MG, AST, ALT, ALKPHOS, BILITOT in the last 168  hours.  Invalid input(s): GFRCGP ------------------------------------------------------------------------------------------------------------------  Cardiac Enzymes No results for input(s): TROPONINI in the last 168 hours. ------------------------------------------------------------------------------------------------------------------  RADIOLOGY:  Dg Chest 1 View  Result Date: 02/04/2016 CLINICAL DATA:  Femur fracture today. Preoperative respiratory exam. EXAM: CHEST 1 VIEW COMPARISON:  10/05/2014 FINDINGS: Heart size is normal. Mediastinal shadows are normal. The lungs are clear. No effusions. No acute bony finding. IMPRESSION: No active disease. Electronically Signed   By: Nelson Chimes M.D.   On: 02/04/2016 14:43   Dg Knee 1-2 Views Left  Result Date: 02/04/2016 CLINICAL DATA:  Pain following fall EXAM: LEFT KNEE - 1-2 VIEW COMPARISON:  None. FINDINGS: Frontal and lateral views were obtained. There is no acute fracture or dislocation. No joint effusion. There is narrowing medially and in the patellofemoral joint region. Spurs arise from the anterior patella. No erosive change. IMPRESSION: Areas of osteoarthritic change.  No fracture or joint effusion. Electronically Signed   By: Lowella Grip III M.D.   On: 02/04/2016 14:06   Ct Head Wo Contrast  Result Date: 02/04/2016 CLINICAL DATA:  Fall today.  Head and neck injury and pain. EXAM: CT HEAD WITHOUT CONTRAST CT CERVICAL SPINE WITHOUT CONTRAST TECHNIQUE: Multidetector CT imaging of the head and cervical spine was performed  following the standard protocol without intravenous contrast. Multiplanar CT image reconstructions of the cervical spine were also generated. COMPARISON:  Head CT on 10/05/2014 FINDINGS: CT HEAD FINDINGS There is no evidence of intracranial hemorrhage, brain edema, or other signs of acute infarction. There is no evidence of intracranial mass lesion or mass effect. No abnormal extraaxial fluid collections are identified.  Moderate diffuse cerebral atrophy and mild chronic small vessel disease are stable. Ventricles are stable in size. No evidence of skull fracture or pneumocephalus. CT CERVICAL SPINE FINDINGS No evidence of acute fracture, subluxation, or prevertebral soft tissue swelling. Mild degenerative disc disease seen at levels of C5-6 and C6-7. Moderate to severe facet DJD seen mainly on the left from levels of C3 -C6. IMPRESSION: No acute intracranial abnormality. Stable moderate cerebral atrophy and mild chronic small vessel disease. No evidence of acute cervical spine fracture or subluxation. Degenerative spondylosis, as described above. Electronically Signed   By: Earle Gell M.D.   On: 02/04/2016 14:16   Dg Hip Unilat With Pelvis 2-3 Views Left  Result Date: 02/04/2016 CLINICAL DATA:  Pain following fall EXAM: DG HIP (WITH OR WITHOUT PELVIS) 2-3V LEFT COMPARISON:  None. FINDINGS: Frontal pelvis well as frontal and lateral left hip images were obtained. There is a fracture of the proximal left femoral diaphysis, primarily inferior to the intertrochanteric region but with avulsion of the lesser trochanter. There is medial displacement of distal major fracture fragments with respect to the major proximal fragment. No other areas of fracture. No dislocation. There is mild symmetric narrowing of both hip joints. No erosive change. IMPRESSION: Comminuted fracture proximal left femoral diaphysis with displaced major fracture fragments. There is avulsion of the lesser trochanter on the left. No other fractures. No dislocation. Mild symmetric narrowing both hip joints. Electronically Signed   By: Lowella Grip III M.D.   On: 02/04/2016 14:05    EKG:  No orders found for this or any previous visit.  IMPRESSION AND PLAN:   82 year old Caucasian gentleman history of type 2 diabetes non-insulin-requiring presenting after mechanical fall  1. Closed left hip fracture initial encounter/preoperative evaluation: Moderate  risk for moderate risk surgery from cardiac standpoint no further interventions testing required prior to surgery. Orthopedic consult planning surgery hopefully today per emergency department staff. Continue with pain control can add Lovenox after surgery. Pain control, bowel regimen As far as medications are concerned hold aspirin hold oral diabetic agents hold Lasix and ACE inhibitor in the perioperative period can be restarted postoperatively   2. Type 2 diabetes non-insulin-requiring: Hold oral agents add sliding scale coverage 3. Essential hypertension: Hold Lasix and ACE inhibitor and perioperative period can restart after 4. Venous embolism prophylactic: SCDs    All the records are reviewed and case discussed with ED provider. Management plans discussed with the patient, family and they are in agreement.  CODE STATUS: Full  TOTAL TIME TAKING CARE OF THIS PATIENT: 33 minutes.    Hower,  Karenann Cai.D on 02/04/2016 at 3:01 PM  Between 7am to 6pm - Pager - [phone]  After 6pm: House Pager: - [phone]  [facility]  Office  [phone]  CC: Primary care physician; Albina Billet, MD

## 2016-02-04 NOTE — Anesthesia Preprocedure Evaluation (Addendum)
Anesthesia Evaluation  Patient identified by MRN, date of birth, ID band Patient awake    Reviewed: Allergy & Precautions, NPO status , Patient's Chart, lab work & pertinent test results, reviewed documented beta blocker date and time   Airway Mallampati: II  TM Distance: >3 FB     Dental  (+) Chipped   Pulmonary sleep apnea , former smoker,           Cardiovascular hypertension, Pt. on medications + dysrhythmias      Neuro/Psych    GI/Hepatic   Endo/Other  diabetes  Renal/GU      Musculoskeletal  (+) Arthritis ,   Abdominal   Peds  Hematology   Anesthesia Other Findings Gout. H/o bradycaria. Uses CPAP.  Reproductive/Obstetrics                           Anesthesia Physical Anesthesia Plan  ASA: III  Anesthesia Plan: Spinal   Post-op Pain Management:    Induction:   Airway Management Planned: Oral ETT  Additional Equipment:   Intra-op Plan:   Post-operative Plan:   Informed Consent: I have reviewed the patients History and Physical, chart, labs and discussed the procedure including the risks, benefits and alternatives for the proposed anesthesia with the patient or authorized representative who has indicated his/her understanding and acceptance.     Plan Discussed with: CRNA  Anesthesia Plan Comments:         Anesthesia Quick Evaluation

## 2016-02-05 ENCOUNTER — Encounter: Payer: Self-pay | Admitting: Surgery

## 2016-02-05 LAB — CBC
HCT: 35.5 % — ABNORMAL LOW (ref 40.0–52.0)
Hemoglobin: 12.1 g/dL — ABNORMAL LOW (ref 13.0–18.0)
MCH: 32.6 pg (ref 26.0–34.0)
MCHC: 34.1 g/dL (ref 32.0–36.0)
MCV: 95.6 fL (ref 80.0–100.0)
Platelets: 161 10*3/uL (ref 150–440)
RBC: 3.71 MIL/uL — ABNORMAL LOW (ref 4.40–5.90)
RDW: 13.6 % (ref 11.5–14.5)
WBC: 11.8 10*3/uL — ABNORMAL HIGH (ref 3.8–10.6)

## 2016-02-05 LAB — BASIC METABOLIC PANEL
ANION GAP: 7 (ref 5–15)
BUN: 16 mg/dL (ref 6–20)
CHLORIDE: 105 mmol/L (ref 101–111)
CO2: 27 mmol/L (ref 22–32)
Calcium: 8 mg/dL — ABNORMAL LOW (ref 8.9–10.3)
Creatinine, Ser: 0.93 mg/dL (ref 0.61–1.24)
GFR calc non Af Amer: >60 mL/min (ref >60)
Glucose, Bld: 177 mg/dL — ABNORMAL HIGH (ref 65–99)
POTASSIUM: 3.9 mmol/L (ref 3.5–5.1)
Sodium: 139 mmol/L (ref 135–145)

## 2016-02-05 LAB — GLUCOSE, CAPILLARY
GLUCOSE-CAPILLARY: 160 mg/dL — AB (ref 65–99)
Glucose-Capillary: 202 mg/dL — ABNORMAL HIGH (ref 65–99)
Glucose-Capillary: 179 mg/dL — ABNORMAL HIGH (ref 65–99)
Glucose-Capillary: 212 mg/dL — ABNORMAL HIGH (ref 65–99)

## 2016-02-05 MED ORDER — SODIUM CHLORIDE 0.9 % IV BOLUS (SEPSIS)
500.0000 mL | Freq: Once | INTRAVENOUS | Status: AC
  Administered 2016-02-05: 500 mL via INTRAVENOUS

## 2016-02-05 NOTE — Progress Notes (Signed)
Notified Dr Marcille Blanco of low blood pressure. Order to bolus pt received. Continue to monitor

## 2016-02-05 NOTE — Anesthesia Postprocedure Evaluation (Signed)
Anesthesia Post Note  Patient: Herbert Phillips  Procedure(s) Performed: Procedure(s) (LRB): INTRAMEDULLARY (IM) NAIL INTERTROCHANTRIC (Left)  Patient location during evaluation: Nursing Unit Anesthesia Type: Spinal Level of consciousness: awake and alert and oriented Pain management: satisfactory to patient Vital Signs Assessment: post-procedure vital signs reviewed and stable Respiratory status: respiratory function stable Cardiovascular status: stable Postop Assessment: no backache, no headache, spinal receding, adequate PO intake, no signs of nausea or vomiting and patient able to bend at knees Anesthetic complications: no    Last Vitals:  Vitals:   02/05/16 0332 02/05/16 0732  BP: (!) 98/48 (!) 102/45  Pulse: 76 78  Resp: 19 17  Temp: 37.1 C 36.7 C    Last Pain:  Vitals:   02/05/16 0732  TempSrc: Oral  PainSc:                  [name]

## 2016-02-05 NOTE — Care Management (Signed)
Patient presents from [facility] and plans for transfer to [facility] when medically stable.

## 2016-02-05 NOTE — Progress Notes (Signed)
[facility] [place] NAME: Herbert Phillips    MR#:  [id number]  DATE OF BIRTH:  1925-07-28  SUBJECTIVE:  CHIEF COMPLAINT:   Chief Complaint  Patient presents with  . Leg Injury   No complaint. REVIEW OF SYSTEMS:  Review of Systems  Constitutional: Negative for chills and fever.  HENT: Negative for congestion.   Respiratory: Negative for cough, shortness of breath, wheezing and stridor.   Cardiovascular: Negative for chest pain, palpitations and leg swelling.  Gastrointestinal: Negative for abdominal pain, diarrhea, nausea and vomiting.  Genitourinary: Negative for dysuria and urgency.  Musculoskeletal: Negative for back pain and joint pain.  Neurological: Negative for dizziness and weakness.  Psychiatric/Behavioral: Negative for depression. The patient is not nervous/anxious.     DRUG ALLERGIES:  No Known Allergies VITALS:  Blood pressure (!) 107/49, pulse 88, temperature 98 F (36.7 C), temperature source Oral, resp. rate 18, height 6' (1.829 m), weight 175 lb (79.4 kg), SpO2 98 %. PHYSICAL EXAMINATION:  Physical Exam  Constitutional: He is oriented to person, place, and time and well-developed, well-nourished, and in no distress. No distress.  HENT:  Head: Normocephalic and atraumatic.  Eyes: Conjunctivae and EOM are normal. Pupils are equal, round, and reactive to light.  Neck: Normal range of motion. Neck supple. No JVD present.  Cardiovascular: Normal rate, regular rhythm and normal heart sounds.  Exam reveals no gallop and no friction rub.   No murmur heard. Pulmonary/Chest: Effort normal and breath sounds normal. No stridor. He has no wheezes. He has no rales.  Abdominal: Soft. Bowel sounds are normal. He exhibits no distension.  Musculoskeletal: He exhibits no edema.  Neurological: He is alert and oriented to person, place, and time. No cranial nerve deficit.  Skin: Skin is warm and dry.  Psychiatric: Mood, memory,  affect and judgment normal.   LABORATORY PANEL:   CBC  Recent Labs Lab 02/05/16 0442  WBC 11.8*  HGB 12.1*  HCT 35.5*  PLT 161   ------------------------------------------------------------------------------------------------------------------ Chemistries   Recent Labs Lab 02/05/16 0442  NA 139  K 3.9  CL 105  CO2 27  GLUCOSE 177*  BUN 16  CREATININE 0.93  CALCIUM 8.0*   RADIOLOGY:  Dg C-arm 1-60 Min  Result Date: 02/04/2016 CLINICAL DATA:  Comminuted subtrochanteric left hip fracture status post ORIF. EXAM: OPERATIVE left HIP (WITH PELVIS IF PERFORMED) 6 VIEWS TECHNIQUE: Fluoroscopic spot image(s) were submitted for interpretation post-operatively. COMPARISON:  Left hip radiographs from earlier in the same day. FINDINGS: Left I am rod is in place with a single distal interlocking screw. There is a dynamic femoral neck screw. Alignment is grossly anatomic. There is mild sub trochanteric displacement. IMPRESSION: 1. ORIF with intra medullary rod and dynamic femoral neck screw. 2. Mild persistent proximal displacement at the oblique subtrochanteric fracture. Electronically Signed   By: San Morelle M.D.   On: 02/04/2016 22:11   Dg Hip Operative Unilat W Or W/o Pelvis Left  Result Date: 02/04/2016 CLINICAL DATA:  Comminuted subtrochanteric left hip fracture status post ORIF. EXAM: OPERATIVE left HIP (WITH PELVIS IF PERFORMED) 6 VIEWS TECHNIQUE: Fluoroscopic spot image(s) were submitted for interpretation post-operatively. COMPARISON:  Left hip radiographs from earlier in the same day. FINDINGS: Left I am rod is in place with a single distal interlocking screw. There is a dynamic femoral neck screw. Alignment is grossly anatomic. There is mild sub trochanteric displacement. IMPRESSION: 1. ORIF with intra medullary rod and dynamic femoral neck screw.  2. Mild persistent proximal displacement at the oblique subtrochanteric fracture. Electronically Signed   By: San Morelle M.D.   On: 02/04/2016 22:11   ASSESSMENT AND PLAN:   82 year old Caucasian gentleman history of type 2 diabetes non-insulin-requiring presenting after mechanical fall  1. Closed left hip fracture, POD1,  Pain control, bowel regimen, PT.  2. Type 2 diabetes non-insulin-requiring: Hold oral agents, on sliding scale coverage 3. Essential hypertension: Hold Lasix and ACE inhibitor due to low side BP. 4. Venous embolism prophylactic: start lovenox.   All the records are reviewed and case discussed with Care Management/Social Worker. Management plans discussed with the patient, his daughter and they are in agreement.  CODE STATUS: full code.  TOTAL TIME TAKING CARE OF THIS PATIENT: 33 minutes.   More than 50% of the time was spent in counseling/coordination of care: YES  POSSIBLE D/C to SNF IN 2 DAYS, DEPENDING ON CLINICAL CONDITION.   Demetrios Loll M.D on 02/05/2016 at 4:52 PM  Between 7am to 6pm - Pager - [phone]  After 6pm go to [email or web address] - Proofreader  [facility]  Office  [phone]  CC: Primary care physician; Albina Billet, MD  Note: This dictation was prepared with Dragon dictation along with smaller phrase technology. Any transcriptional errors that result from this process are unintentional.

## 2016-02-05 NOTE — Clinical Social Work Placement (Signed)
   CLINICAL SOCIAL WORK PLACEMENT  NOTE  Date:  02/05/2016  Patient Details  Name: Herbert Phillips MRN: [id number] Date of Birth: 1925-10-31  Clinical Social Work is seeking post-discharge placement for this patient at the [place] level of care (*CSW will initial, date and re-position this form in  chart as items are completed):  Yes   Patient/family provided with [place] [facility] Work Department's list of facilities offering this level of care within the geographic area requested by the patient (or if unable, by the patient's family).  Yes   Patient/family informed of their freedom to choose among providers that offer the needed level of care, that participate in Medicare, Medicaid or managed care program needed by the patient, have an available bed and are willing to accept the patient.  Yes   Patient/family informed of [facility]'s ownership interest in [facility] and [facility], as well as of the fact that they are under no obligation to receive care at these facilities.  PASRR submitted to EDS on 02/05/16     PASRR number received on 02/05/16     Existing PASRR number confirmed on       FL2 transmitted to all facilities in geographic area requested by pt/family on 02/05/16     FL2 transmitted to all facilities within larger geographic area on       Patient informed that his/her managed care company has contracts with or will negotiate with certain facilities, including the following:            Patient/family informed of bed offers received.  Patient chooses bed at       Physician recommends and patient chooses bed at      Patient to be transferred to   on  .  Patient to be transferred to facility by       Patient family notified on   of transfer.  Name of family member notified:        PHYSICIAN       Additional Comment:    _______________________________________________ Clorine Swing, Veronia Beets, LCSW 02/05/2016, 11:57  AM

## 2016-02-05 NOTE — Progress Notes (Signed)
Physical Therapy Treatment Patient Details Name: Herbert Phillips MRN: [id number] DOB: 1926/04/27 Today's Date: 02/05/2016    History of Present Illness Herbert Phillips  is a 82 y.o. male with a known history of Type 2 diabetes non-insulin-requiring who is presenting after mechanical fall. Patient states he finished a round of golf shoes were wet he fell injuring his left leg. He stated immediate pain in the left hip described only as "pain" 8/10 intensity no worsening or relieving factors. He does state however he is unable to move that left leg at this time. He denies any prior chest pain palpitation shortness of breath further symptomatology at this time. Pt underwent L hip IM nailing without reported post-op complications. This is the only fall he has suffered in the last 12 months.     PT Comments    Pt demonstrates improved sequencing with transfers and ambulation at this time. He is able to increase his ambulation distance however with increased LLE buckling as distance progresses. Pt able to complete all seated exercises requiring assist for R hip flexion secondary to weakness. Pt will benefit from skilled PT services to address deficits in strength, balance, and mobility in order to return to full function at home.    Follow Up Recommendations  SNF     Equipment Recommendations  Rolling walker with 5" wheels    Recommendations for Other Services       Precautions / Restrictions Precautions Precautions: Fall Restrictions Weight Bearing Restrictions: Yes LLE Weight Bearing: Partial weight bearing LLE Partial Weight Bearing Percentage or Pounds: 50%    Mobility  Bed Mobility Overal bed mobility: Needs Assistance Bed Mobility: Sit to Supine     Supine to sit: Min assist Sit to supine: Mod assist;+2 for physical assistance   General bed mobility comments: Pt requires cues and assist for trunk and LEs when returning to bed. Educated about using RLE to assist LLE when  returning to bed  Transfers Overall transfer level: Needs assistance Equipment used: Rolling walker (2 wheeled) Transfers: Sit to/from Stand Sit to Stand: Min assist         General transfer comment: Pt demonstrates improved sequencing with sit to stand from lower surface at this time. Educated about proper anterior weight shifting and cued for safe hand placement. Reviewed WB precautions with patient  Ambulation/Gait Ambulation/Gait assistance: Min assist Ambulation Distance (Feet): 20 Feet Assistive device: Rolling walker (2 wheeled) Gait Pattern/deviations: Step-to pattern Gait velocity: Decreased Gait velocity interpretation: <1.8 ft/sec, indicative of risk for recurrent falls General Gait Details: Pt ambulates from recliner to door and back to bed. Cues provided for proper sequencing with rolling walker and reminders for PWB on LLE. Pt requires some assist utilizing appropriate sequencing with walker during ambulation and turns. Pt with some posterior leaning requiring intermittent assist from therapist to prevent falls. +2 present for safety and assistance. Pt demonstrates worsening LLE buckling as ambulation distance increases   Stairs            Wheelchair Mobility    Modified Rankin (Stroke Patients Only)       Balance Overall balance assessment: Needs assistance Sitting-balance support: No upper extremity supported Sitting balance-Leahy Scale: Good     Standing balance support: Bilateral upper extremity supported Standing balance-Leahy Scale: Fair                      Cognition Arousal/Alertness: Awake/alert Behavior During Therapy: WFL for tasks assessed/performed Overall Cognitive Status: Within Functional Limits for  tasks assessed                      Exercises General Exercises - Lower Extremity Ankle Circles/Pumps: Strengthening;Both;Supine;15 reps Long Arc Quad: Strengthening;Left;15 reps;Seated Heel Slides:  Strengthening;Left;15 reps;Seated Hip ABduction/ADduction: Strengthening;Both;15 reps;Seated Straight Leg Raises: Strengthening;Left;10 reps;Supine Hip Flexion/Marching: Strengthening;Both;15 reps;Seated Heel Raises: Strengthening;15 reps;Both;Seated    General Comments        Pertinent Vitals/Pain Pain Assessment: 0-10 Pain Score: 5  Pain Location: L knee, denies L hip pain Pain Intervention(s): Monitored during session    Home Living                      Prior Function            PT Goals (current goals can now be found in the care plan section) Acute Rehab PT Goals Patient Stated Goal: Return to prior level of function at home PT Goal Formulation: With patient/family Time For Goal Achievement: 02/19/16 Potential to Achieve Goals: Good Progress towards PT goals: Progressing toward goals    Frequency  BID    PT Plan Current plan remains appropriate    Co-evaluation             End of Session Equipment Utilized During Treatment: Gait belt Activity Tolerance: Patient tolerated treatment well Patient left: with SCD's reapplied;Other (comment);in bed;with bed alarm set;with call bell/phone within reach (pillow under LLE, RN notified that pack needs ice)     Time: [id number] PT Time Calculation (min) (ACUTE ONLY): 24 min  Charges:  $Gait Training: 8-22 mins $Therapeutic Exercise: 8-22 mins                    G Codes:      Herbert Phillips PT, DPT   Herbert Phillips 02/05/2016, 2:47 PM

## 2016-02-05 NOTE — Anesthesia Procedure Notes (Signed)
Spinal  Patient location during procedure: OR Staffing Anesthesiologist: Gunnar Bulla Performed: anesthesiologist  Preanesthetic Checklist Completed: patient identified, site marked, surgical consent, pre-op evaluation, timeout performed, IV checked and risks and benefits discussed Spinal Block Patient position: sitting Prep: Betadine Patient monitoring: heart rate, cardiac monitor, continuous pulse ox and blood pressure Approach: midline Location: L3-4 Injection technique: single-shot Needle Needle type: Pencil-Tip  Needle gauge: 25 G Needle length: 9 cm Assessment Sensory level: T10 Additional Notes Marcaine 12.5mg 

## 2016-02-05 NOTE — NC FL2 (Signed)
[place] LEVEL OF CARE SCREENING TOOL     IDENTIFICATION  Patient Name: Herbert Phillips Birthdate: 06/23/1926 Sex: male Admission Date (Current Location): 02/04/2016  [place] and [place] Number:  Engineering geologist and Address:  [facility], [address] [place], [place] [address]      Provider Number: [id number]  Attending Physician Name and Address:  Demetrios Loll, MD  Relative Name and Phone Number:       Current Level of Care: Hospital Recommended Level of Care: [place] Prior Approval Number:    Date Approved/Denied:   PASRR Number:  ([id number])  Discharge Plan: SNF    Current Diagnoses: Patient Active Problem List   Diagnosis Date Noted  . Closed left hip fracture ([place]) 02/04/2016  . Bladder cancer ([place]) 10/24/2014  . Diabetes ([place]) 09/14/2013  . Dyslipidemia 09/14/2013  . BP (high blood pressure) 09/14/2013  . Obstructive apnea 09/14/2013  . Diabetes mellitus ([place]) 09/14/2013    Orientation RESPIRATION BLADDER Height & Weight     Self, Time, Situation, Place  Normal Continent Weight: 175 lb (79.4 kg) Height:  6' (182.9 cm)  BEHAVIORAL SYMPTOMS/MOOD NEUROLOGICAL BOWEL NUTRITION STATUS   (none )  (none ) Continent Diet (Diet: Carb Modified )  AMBULATORY STATUS COMMUNICATION OF NEEDS Skin   Extensive Assist Verbally Surgical wounds, Other (Comment) (Incision: Left Hip. Left Elbow Laceration. )                       Personal Care Assistance Level of Assistance  Bathing, Feeding, Dressing Bathing Assistance: Limited assistance Feeding assistance: Independent Dressing Assistance: Limited assistance     Functional Limitations Info  Sight, Hearing, Speech Sight Info: Adequate Hearing Info: Adequate Speech Info: Adequate    SPECIAL CARE FACTORS FREQUENCY  PT (By licensed PT), OT (By licensed OT)     PT Frequency:  (5) OT Frequency:  (5)            Contractures       Additional Factors Info  Code Status, Allergies, Insulin Sliding Scale Code Status Info:  (Full Code. ) Allergies Info:  (No Known Allergies. )   Insulin Sliding Scale Info:  (NovoLog Insulin Injections 3 times per day. )       Current Medications (02/05/2016):  This is the current hospital active medication list Current Facility-Administered Medications  Medication Dose Route Frequency Provider Last Rate Last Dose  . 0.9 % NaCl with KCl 20 mEq/ L  infusion   Intravenous Continuous Corky Mull, MD 75 mL/hr at 02/05/16 0128    . acetaminophen (TYLENOL) tablet 650 mg  650 mg Oral Q6H PRN Corky Mull, MD       Or  . acetaminophen (TYLENOL) suppository 650 mg  650 mg Rectal Q6H PRN Corky Mull, MD      . acetaminophen (TYLENOL) tablet 1,000 mg  1,000 mg Oral Q6H Corky Mull, MD   1,000 mg at 02/05/16 [id number]  . allopurinol (ZYLOPRIM) tablet 100 mg  100 mg Oral Daily Lytle Butte, MD   100 mg at 02/05/16 0858  . bacitracin ointment   Topical BID Rudene Re, MD   1 application at [id number]  . bisacodyl (DULCOLAX) suppository 10 mg  10 mg Rectal Daily PRN Corky Mull, MD      . ceFAZolin (ANCEF) IVPB 2g/100 mL premix  2 g Intravenous Q6H Corky Mull, MD   2 g at 02/05/16  [id number]  . diphenhydrAMINE (BENADRYL) 12.5 MG/5ML elixir 12.5-25 mg  12.5-25 mg Oral Q4H PRN Corky Mull, MD      . docusate sodium (COLACE) capsule 100 mg  100 mg Oral BID Corky Mull, MD   100 mg at 02/05/16 0856  . enoxaparin (LOVENOX) injection 40 mg  40 mg Subcutaneous Q24H Corky Mull, MD   40 mg at 02/05/16 0856  . HYDROmorphone (DILAUDID) injection 0.5-1 mg  0.5-1 mg Intravenous Q2H PRN Corky Mull, MD      . insulin aspart (novoLOG) injection 0-5 Units  0-5 Units Subcutaneous QHS Lytle Butte, MD      . insulin aspart (novoLOG) injection 0-9 Units  0-9 Units Subcutaneous TID WC Lytle Butte, MD   2 Units at 02/05/16 0756  . magnesium hydroxide (MILK OF MAGNESIA) suspension 30 mL  30 mL Oral Daily  PRN Corky Mull, MD      . metoCLOPramide (REGLAN) tablet 5-10 mg  5-10 mg Oral Q8H PRN Corky Mull, MD       Or  . metoCLOPramide (REGLAN) injection 5-10 mg  5-10 mg Intravenous Q8H PRN Corky Mull, MD      . mirabegron [facility]) tablet 25 mg  25 mg Oral Daily Lytle Butte, MD   25 mg at 02/05/16 0857  . ondansetron (ZOFRAN) tablet 4 mg  4 mg Oral Q6H PRN Corky Mull, MD       Or  . ondansetron [facility]) injection 4 mg  4 mg Intravenous Q6H PRN Corky Mull, MD      . oxyCODONE (Oxy IR/ROXICODONE) immediate release tablet 5-10 mg  5-10 mg Oral Q3H PRN Corky Mull, MD   5 mg at 02/05/16 0857  . pantoprazole (PROTONIX) EC tablet 40 mg  40 mg Oral QAC breakfast Corky Mull, MD   40 mg at 02/05/16 0858  . polyethylene glycol (MIRALAX / GLYCOLAX) packet 17 g  17 g Oral Daily PRN Lytle Butte, MD      . pravastatin (PRAVACHOL) tablet 20 mg  20 mg Oral q1800 Lytle Butte, MD      . sodium phosphate (FLEET) 7-19 GM/118ML enema 1 enema  1 enema Rectal Once PRN Corky Mull, MD      . tamsulosin [facility]) capsule 0.4 mg  0.4 mg Oral Daily Lytle Butte, MD   0.4 mg at 02/05/16 0856  . vitamin B-12 (CYANOCOBALAMIN) tablet 1,000 mcg  1,000 mcg Oral Daily Lytle Butte, MD   1,000 mcg at 02/05/16 [id number]     Discharge Medications: Please see discharge summary for a list of discharge medications.  Relevant Imaging Results:  Relevant Lab Results:   Additional Information  (SSN: [id number])  [place] Mayabb, Veronia Beets, LCSW

## 2016-02-05 NOTE — Progress Notes (Signed)
Physical Therapy Evaluation Patient Details Name: Herbert Phillips MRN: [id number] DOB: [birth year] Today's Date: 02/05/2016   History of Present Illness  Herbert Phillips  is a 82 y.o. male with a known history of Type 2 diabetes non-insulin-requiring who is presenting after mechanical fall. Patient states he finished a round of golf shoes were wet he fell injuring his left leg. He stated immediate pain in the left hip described only as "pain" 8/10 intensity no worsening or relieving factors. He does state however he is unable to move that left leg at this time. He denies any prior chest pain palpitation shortness of breath further symptomatology at this time. Pt underwent L hip IM nailing without reported post-op complications. This is the only fall he has suffered in the last 12 months.   Clinical Impression  Pt demonstrates decreased in LLE strength and limitations with bed mobility, transfers, and ambulation. He requires assist for all mobility due to weakness and instability. Pt requires CGA for limited ambulation from bed to recliner. He moves very slowly and requires repeated cues for proper sequencing with walker. Independent with SAQ but requires assist for SLR due to weakness. Currently denies L hip pain but reporting pain in L thigh and knee. Pt will need SNF placement at discharge in order to facilitate safe return to prior level of function at home. Pt will benefit from skilled PT services to address deficits in strength, balance, and mobility in order to return to full function at home.     Follow Up Recommendations SNF    Equipment Recommendations  Rolling walker with 5" wheels    Recommendations for Other Services       Precautions / Restrictions Precautions Precautions: Fall Restrictions Weight Bearing Restrictions: Yes LLE Weight Bearing: Partial weight bearing LLE Partial Weight Bearing Percentage or Pounds: 50%      Mobility  Bed Mobility Overal bed mobility:  Needs Assistance Bed Mobility: Supine to Sit     Supine to sit: Min assist     General bed mobility comments: Pt requires cues for sequencing with turning in bed and going from semirecumbent to sitting. Good strength noted with L hip abduction when exiting bed to L side  Transfers Overall transfer level: Needs assistance Equipment used: Rolling walker (2 wheeled) Transfers: Sit to/from Stand Sit to Stand: Min assist         General transfer comment: Pt requires increased time to come to standing due to decreased LLE strength. Pt provided cues for safe hand placement during transfer. After first attempt at standing pt loses his balance backwards and comes back to sitting on bed. Able to perform standing marches prior to ambulation maintaining PWB on LLE  Ambulation/Gait Ambulation/Gait assistance: Min guard;+2 safety/equipment Ambulation Distance (Feet): 5 Feet Assistive device: Rolling walker (2 wheeled) Gait Pattern/deviations: Step-to pattern;Decreased step length - right;Decreased stance time - left;Decreased weight shift to left Gait velocity: Decreased Gait velocity interpretation: <1.8 ft/sec, indicative of risk for recurrent falls General Gait Details: Pt ambulates from bed to recliner with heavy cues for proper sequencing with rolling walker. He requires +2 assist for safety due to LLE buckling. Pt moves slowly but safely. He demonstrates good UE strength in order to offload LLE and maintain PWB status.  Stairs            Wheelchair Mobility    Modified Rankin (Stroke Patients Only)       Balance Overall balance assessment: Needs assistance Sitting-balance support: No upper extremity supported Sitting balance-Leahy  Scale: Good     Standing balance support: Bilateral upper extremity supported Standing balance-Leahy Scale: Fair Standing balance comment: Requires UE support for standing balance due to LLE weakness                              Pertinent Vitals/Pain Pain Assessment: 0-10 Pain Score: 0-No pain Pain Location: Denies L hip pain at rest. Pain in L thigh and knee increase with activity. Pt continues to deny L hip pain Pain Intervention(s): Monitored during session;Premedicated before session    [place] expects to be discharged to:: Private residence Living Arrangements: Spouse/significant other Available Help at Discharge: Family Type of Home: House Home Access: Level entry     Home Layout: One level Home Equipment: [place] - single point;Shower seat;Shower seat - built in Brewing technologist) Additional Comments: Lives in an independent living cottage at [facility]    Prior Function Level of Independence: Independent         Comments: Infrequent use of spc with dizziness. Independent with ADLs/IADLs. Drives and plays golf     Hand Dominance   Dominant Hand: Right    Extremity/Trunk Assessment   Upper Extremity Assessment: Overall WFL for tasks assessed           Lower Extremity Assessment: LLE deficits/detail   LLE Deficits / Details: RLE grossly WFL. Pt demonstrate ability to independently perform L SAQ. Full DF/PF LLE with fully intact sensation. Requires 2 finger assist for LLE SLR     Communication   Communication: No difficulties  Cognition Arousal/Alertness: Awake/alert Behavior During Therapy: WFL for tasks assessed/performed Overall Cognitive Status: Within Functional Limits for tasks assessed                      General Comments      Exercises General Exercises - Lower Extremity Ankle Circles/Pumps: Strengthening;Both;10 reps;Supine Quad Sets: Strengthening;Both;10 reps;Supine Gluteal Sets: Strengthening;Both;10 reps;Supine Short Arc Quad: Strengthening;Left;10 reps;Supine Hip ABduction/ADduction: Strengthening;Left;10 reps;Supine Straight Leg Raises: Strengthening;Left;10 reps;Supine      Assessment/Plan    PT Assessment Patient  needs continued PT services  PT Diagnosis Difficulty walking;Abnormality of gait;Generalized weakness;Acute pain   PT Problem List Decreased strength;Decreased activity tolerance;Decreased balance;Decreased mobility;Decreased knowledge of use of DME;Pain  PT Treatment Interventions DME instruction;Gait training;Therapeutic activities;Therapeutic exercise;Balance training;Neuromuscular re-education;Patient/family education;Manual techniques   PT Goals (Current goals can be found in the Care Plan section) Acute Rehab PT Goals Patient Stated Goal: Return to prior level of function at home PT Goal Formulation: With patient/family Time For Goal Achievement: 02/19/16 Potential to Achieve Goals: Good    Frequency BID   Barriers to discharge Decreased caregiver support Lives at independent living with wife    Co-evaluation               End of Session Equipment Utilized During Treatment: Gait belt Activity Tolerance: Patient tolerated treatment well Patient left: in chair;with call bell/phone within reach;with chair alarm set;with SCD's reapplied;Other (comment) (pillow under LLE, ice pack on hip) Nurse Communication: Mobility status         Time: [id number] PT Time Calculation (min) (ACUTE ONLY): 48 min   Charges:   PT Evaluation $PT Eval Low Complexity: 1 Procedure PT Treatments $Therapeutic Exercise: 8-22 mins   PT G Codes:        Ryelle Ruvalcaba 2016-02-07, 11:20 AM

## 2016-02-05 NOTE — Clinical Social Work Note (Signed)
Clinical Social Work Assessment  Patient Details  Name: Herbert Phillips MRN: [id number] Date of Birth: 05-13-1926  Date of referral:  02/05/16               Reason for consult:  Facility Placement, Other (Comment Required) (From [place] living. )                Permission sought to share information with:  Chartered certified accountant granted to share information::  Yes, Verbal Permission Granted  Name::      [place]::   [place]   Relationship::     Contact Information:     Housing/Transportation Living arrangements for the past 2 months:  [place] of Information:  Patient, Adult Children Patient Interpreter Needed:  None Criminal Activity/Legal Involvement Pertinent to Current Situation/Hospitalization:  No - Comment as needed Significant Relationships:  Adult Children, Spouse Lives with:  Spouse Do you feel safe going back to the place where you live?  Yes Need for family participation in patient care:  Yes (Comment)  Care giving concerns:  Patient lives in a cottage at [place] independent living with his wife Hydrographic surveyor.   Social Worker assessment / plan:  Holiday representative (CSW) received SNF consult. PT is recommending SNF. CSW met with patient and his son Herbert Phillips was at bedside. Patient was alert and oriented and was sitting up in the chair. CSW introduced self and explained role of CSW department. Patient reported that he lives at [facility] independent living. CSW explained that PT is recommending SNF. Patient is agreeable to SNF search and prefers [place]. CSW explained that [place] residents usually go to the [employer] at [place] and have a private room. CSW also explained that patient will require a 3 night qualifying inpatient stay at [facility] in order for Medicare to pay for [facility]. Patient was admitted to inpatient on  02/04/16.   FL2 complete and faxed out. CSW sent admissions coordinator at [facility] a message making her aware of above.    Employment status:  Retired Forensic scientist:  Medicare PT Recommendations:  [place] / Referral to community resources:  [place]  Patient/Family's Response to care:  Patient and son are agreeable for patient to go to [facility]   Patient/Family's Understanding of and Emotional Response to Diagnosis, Current Treatment, and Prognosis:  Patient was very pleasant and thanked CSW for visit.   Emotional Assessment Appearance:  Appears stated age Attitude/Demeanor/Rapport:    Affect (typically observed):  Accepting, Adaptable, Pleasant Orientation:  Oriented to Self, Oriented to Place, Oriented to  Time, Oriented to Situation Alcohol / Substance use:  Not Applicable Psych involvement (Current and /or in the community):  No (Comment)  Discharge Needs  Concerns to be addressed:  Discharge Planning Concerns Readmission within the last 30 days:  No Current discharge risk:  Dependent with Mobility Barriers to Discharge:  Continued Medical Work up   [employer], Veronia Beets, LCSW 02/05/2016, 12:00 PM

## 2016-02-05 NOTE — Care Management (Signed)
Patient presents from home and was independent in all adls.  Admitted for hip fracture with surgical repair on 8/14. Physical therapy consult is pending.

## 2016-02-05 NOTE — Progress Notes (Signed)
Subjective: 1 Day Post-Op Procedure(s) (LRB): INTRAMEDULLARY (IM) NAIL INTERTROCHANTRIC (Left) Patient reports pain as mild.  Pt having more pain to the left knee than the left hip.  Patient is well, and has had no acute complaints or problems Plan is to go Skilled nursing facility after hospital stay. Negative for chest pain and shortness of breath Fever: no Gastrointestinal:Negative for nausea and vomiting  Objective: Vital signs in last 24 hours: Temp:  [96.8 F (36 C)-98.9 F (37.2 C)] 98 F (36.7 C) (08/15 0732) Pulse Rate:  [65-100] 85 (08/15 0935) Resp:  [15-25] 17 (08/15 0732) BP: (93-136)/(40-74) 118/57 (08/15 0935) SpO2:  [95 %-99 %] 96 % (08/15 0935) FiO2 (%):  [21 %] 21 % (08/14 2305) Weight:  [79.4 kg (175 lb)] 79.4 kg (175 lb) (08/14 1303)  Intake/Output from previous day:  Intake/Output Summary (Last 24 hours) at 02/05/16 1018 Last data filed at 02/05/16 0815  Gross per 24 hour  Intake             1230 ml  Output             1125 ml  Net              105 ml    Intake/Output this shift: Total I/O In: -  Out: 475 [Urine:475]  Labs:  Recent Labs  02/05/16 0442  HGB 12.1*    Recent Labs  02/05/16 0442  WBC 11.8*  RBC 3.71*  HCT 35.5*  PLT 161    Recent Labs  02/05/16 0442  NA 139  K 3.9  CL 105  CO2 27  BUN 16  CREATININE 0.93  GLUCOSE 177*  CALCIUM 8.0*   No results for input(s): LABPT, INR in the last 72 hours.   EXAM General - Patient is Alert, Appropriate and Oriented Extremity - ABD soft Sensation intact distally Intact pulses distally Dorsiflexion/Plantar flexion intact Incision: dressing C/D/I No cellulitis present Dressing/Incision - clean, dry, no drainage Motor Function - intact, moving foot and toes well on exam.   Negative Homan's to bilateral legs.  Past Medical History:  Diagnosis Date  . Arthritis   . B12 deficiency   . Bladder cancer ([place])   . Dysrhythmia    Bradycardia  . Hip fracture, left ([place])  02/04/2016  . Hypertension   . Non-insulin dependent type 2 diabetes mellitus ([place])   . Sleep apnea     Assessment/Plan: 1 Day Post-Op Procedure(s) (LRB): INTRAMEDULLARY (IM) NAIL INTERTROCHANTRIC (Left) Active Problems:   Closed left hip fracture (HCC)  Estimated body mass index is 23.73 kg/m as calculated from the following:   Height as of this encounter: 6' (1.829 m).   Weight as of this encounter: 79.4 kg (175 lb). Advance diet Up with therapy  BP 118/57, continue IVF. Labs reviewed, BMP and CBC ordered for tomorrow morning. Pt has not had a BM following surgery. PT currently recommending SNF, prior to injury pt was living at home with his wife, independent with all activities.  DVT Prophylaxis - Lovenox, Foot Pumps and TED hose Weight-Bearing as tolerated to left leg  J. Cameron Proud, PA-C [facility] 02/05/2016, 10:18 AM

## 2016-02-06 ENCOUNTER — Encounter
Admission: RE | Admit: 2016-02-06 | Discharge: 2016-02-06 | Disposition: A | Discharge status: Home / Self Care | Payer: Medicare Other | Source: Ambulatory Visit | Attending: Internal Medicine | Admitting: Internal Medicine

## 2016-02-06 LAB — BASIC METABOLIC PANEL
Anion gap: 5 (ref 5–15)
BUN: 12 mg/dL (ref 6–20)
CALCIUM: 8.1 mg/dL — AB (ref 8.9–10.3)
CHLORIDE: 106 mmol/L (ref 101–111)
CO2: 27 mmol/L (ref 22–32)
CREATININE: 0.88 mg/dL (ref 0.61–1.24)
GFR calc Af Amer: >60 mL/min (ref >60)
GFR calc non Af Amer: >60 mL/min (ref >60)
Glucose, Bld: 166 mg/dL — ABNORMAL HIGH (ref 65–99)
Potassium: 3.7 mmol/L (ref 3.5–5.1)
SODIUM: 138 mmol/L (ref 135–145)

## 2016-02-06 LAB — CBC
HCT: 30.3 % — ABNORMAL LOW (ref 40.0–52.0)
HEMOGLOBIN: 10.4 g/dL — AB (ref 13.0–18.0)
MCH: 32.2 pg (ref 26.0–34.0)
MCHC: 34.3 g/dL (ref 32.0–36.0)
MCV: 94 fL (ref 80.0–100.0)
Platelets: 163 10*3/uL (ref 150–440)
RBC: 3.22 MIL/uL — ABNORMAL LOW (ref 4.40–5.90)
RDW: 13.5 % (ref 11.5–14.5)
WBC: 13.8 10*3/uL — ABNORMAL HIGH (ref 3.8–10.6)

## 2016-02-06 LAB — GLUCOSE, CAPILLARY
GLUCOSE-CAPILLARY: 152 mg/dL — AB (ref 65–99)
GLUCOSE-CAPILLARY: 201 mg/dL — AB (ref 65–99)
GLUCOSE-CAPILLARY: 242 mg/dL — AB (ref 65–99)
Glucose-Capillary: 221 mg/dL — ABNORMAL HIGH (ref 65–99)

## 2016-02-06 NOTE — Progress Notes (Addendum)
[place] [facility] [place] NAME: Georgy Staple    MR#:  [id number]  DATE OF BIRTH:  1925-10-18  SUBJECTIVE:  CHIEF COMPLAINT:   Chief Complaint  Patient presents with  . Leg Injury   No complaint. REVIEW OF SYSTEMS:  Review of Systems  Constitutional: Negative for chills and fever.  HENT: Negative for congestion.   Respiratory: Negative for cough, shortness of breath, wheezing and stridor.   Cardiovascular: Negative for chest pain, palpitations and leg swelling.  Gastrointestinal: Negative for abdominal pain, diarrhea, nausea and vomiting.  Genitourinary: Negative for dysuria and urgency.  Musculoskeletal: Negative for back pain and joint pain.  Neurological: Negative for dizziness and weakness.  Psychiatric/Behavioral: Negative for depression. The patient is not nervous/anxious.     DRUG ALLERGIES:  No Known Allergies VITALS:  Blood pressure (!) 131/52, pulse 92, temperature 99 F (37.2 C), temperature source Oral, resp. rate 18, height 6' (1.829 m), weight 175 lb (79.4 kg), SpO2 94 %. PHYSICAL EXAMINATION:  Physical Exam  Constitutional: He is oriented to person, place, and time and well-developed, well-nourished, and in no distress. No distress.  HENT:  Head: Normocephalic and atraumatic.  Eyes: Conjunctivae and EOM are normal. Pupils are equal, round, and reactive to light.  Neck: Normal range of motion. Neck supple. No JVD present.  Cardiovascular: Normal rate, regular rhythm and normal heart sounds.  Exam reveals no gallop and no friction rub.   No murmur heard. Pulmonary/Chest: Effort normal and breath sounds normal. No stridor. He has no wheezes. He has no rales.  Abdominal: Soft. Bowel sounds are normal. He exhibits no distension.  Musculoskeletal: He exhibits no edema.  Neurological: He is alert and oriented to person, place, and time. No cranial nerve deficit.  Skin: Skin is warm and dry.  Psychiatric: Mood, memory,  affect and judgment normal.   LABORATORY PANEL:   CBC  Recent Labs Lab 02/06/16 0639  WBC 13.8*  HGB 10.4*  HCT 30.3*  PLT 163   ------------------------------------------------------------------------------------------------------------------ Chemistries   Recent Labs Lab 02/06/16 0639  NA 138  K 3.7  CL 106  CO2 27  GLUCOSE 166*  BUN 12  CREATININE 0.88  CALCIUM 8.1*   RADIOLOGY:  No results found. ASSESSMENT AND PLAN:   82 year old Caucasian gentleman history of type 2 diabetes non-insulin-requiring presenting after mechanical fall  1. Closed left hip fracture, POD2,  Pain control, bowel regimen, PT.  2. Type 2 diabetes non-insulin-requiring: Hold oral agents, on sliding scale coverage 3. Essential hypertension: Hold Lasix and ACE inhibitor due to low side BP. 4. Venous embolism prophylactic: continue lovenox.  * leukocytosis, possible due to reaction to surgery. F/u CBC. * anemia. Possible due to fracture/surgery. F/u Hb.  All the records are reviewed and case discussed with Care Management/Social Worker. Management plans discussed with the patient, his daughter and they are in agreement.  CODE STATUS: full code.  TOTAL TIME TAKING CARE OF THIS PATIENT: 26 minutes.   More than 50% of the time was spent in counseling/coordination of care: YES  POSSIBLE D/C to SNF IN 1 DAYS, DEPENDING ON CLINICAL CONDITION.   Demetrios Loll M.D on 02/06/2016 at 3:35 PM  Between 7am to 6pm - Pager - [phone]  After 6pm go to [email or web address] - Proofreader  [facility]  Office  [phone]  CC: Primary care physician; Albina Billet, MD  Note: This dictation was prepared with Dragon dictation along with smaller phrase technology. Any transcriptional  errors that result from this process are unintentional.

## 2016-02-06 NOTE — Progress Notes (Signed)
Physical Therapy Treatment Patient Details Name: Herbert Phillips MRN: [id number] DOB: 1925-07-24 Today's Date: 02/06/2016    History of Present Illness Herbert Phillips  is a 82 y.o. male with a known history of Type 2 diabetes non-insulin-requiring who is presenting after mechanical fall. Patient states he finished a round of golf shoes were wet he fell injuring his left leg. He stated immediate pain in the left hip described only as "pain" 8/10 intensity no worsening or relieving factors. He does state however he is unable to move that left leg at this time. He denies any prior chest pain palpitation shortness of breath further symptomatology at this time. Pt underwent L hip IM nailing without reported post-op complications. This is the only fall he has suffered in the last 12 months.     PT Comments    Pt agreeable to PT. Participates well with supine/seated exercises with assist as needed. Requires some increased instruction on isometric exercises to grasp technique, but performs well once understood. Pt requires Min A for bed mobility and transfers with cueing for sequencing/safety and to steady in stand. Pt mildly impulsive initially and some fear of falling, but improves overall quality of transfers and short ambulation distance with increased cueing and focus on 3 point sequence. No buckling noted with increased instruction/lowering rolling walker to improve weightbearing through bilateral upper extremities. Pt up in chair comfortably. Continue PT this afternoon for progression of strength, endurance and safety to improve functional mobility.   Follow Up Recommendations  SNF     Equipment Recommendations  Rolling walker with 5" wheels    Recommendations for Other Services       Precautions / Restrictions Precautions Precautions: Fall Restrictions Weight Bearing Restrictions: Yes LLE Weight Bearing: Partial weight bearing LLE Partial Weight Bearing Percentage or Pounds: 50%     Mobility  Bed Mobility Overal bed mobility: Needs Assistance Bed Mobility: Supine to Sit     Supine to sit: Min assist     General bed mobility comments: Min A for managing LEs over edge of bed and trunk elevation  Transfers Overall transfer level: Needs assistance Equipment used: Rolling walker (2 wheeled) Transfers: Sit to/from Stand Sit to Stand: Min assist         General transfer comment: Requires cues for safe hand placement and hand placement to assist with proper weight shifting. Assist to steady once in stand. Performed 2x with improved control second time following cues and less impulsivity  Ambulation/Gait Ambulation/Gait assistance: Min assist Ambulation Distance (Feet): 5 Feet Assistive device: Rolling walker (2 wheeled) Gait Pattern/deviations: Step-to pattern (bed to chair) Gait velocity: Decreased Gait velocity interpretation: <1.8 ft/sec, indicative of risk for recurrent falls General Gait Details: Rw lowered for improved ability to weightbear through UEs. Heavy cueing for proper sequencing, wt shifting and step lengths. Assist to maintain balance and avoid buckling on the left.    Stairs            Wheelchair Mobility    Modified Rankin (Stroke Patients Only)       Balance Overall balance assessment: Needs assistance Sitting-balance support: Feet supported;Bilateral upper extremity supported Sitting balance-Leahy Scale: Good Sitting balance - Comments: cues initially to keep RLE on ground for improved support   Standing balance support: Bilateral upper extremity supported Standing balance-Leahy Scale: Fair Standing balance comment: mildly unsteady initially, improves with cues for slowing attempts at ambulation and focus on 3 point sequence  Cognition Arousal/Alertness: Awake/alert Behavior During Therapy: WFL for tasks assessed/performed (mildly impulsive) Overall Cognitive Status: Within Functional Limits for  tasks assessed                      Exercises General Exercises - Lower Extremity Ankle Circles/Pumps: AROM;Both;20 reps;Supine Quad Sets: Strengthening;Both;20 reps;Supine Gluteal Sets: Strengthening;Both;20 reps;Supine Long Arc Quad: AAROM;Left;10 reps;Seated Heel Slides: AAROM;Left;20 reps;Supine Hip ABduction/ADduction: AROM;AAROM;Left;20 reps (assist primarily to hold leg up from shearing resist on bed) Straight Leg Raises: AAROM;Left;10 reps;Supine Hip Flexion/Marching: AAROM;Left;10 reps;Seated    General Comments        Pertinent Vitals/Pain Pain Assessment: 0-10 Pain Score: 7  Pain Location: L thigh/groin Pain Descriptors / Indicators: Sore Pain Intervention(s): Monitored during session;Premedicated before session;Repositioned    Home Living                      Prior Function            PT Goals (current goals can now be found in the care plan section) Progress towards PT goals: Progressing toward goals    Frequency  BID    PT Plan Current plan remains appropriate    Co-evaluation             End of Session Equipment Utilized During Treatment: Gait belt Activity Tolerance: Patient tolerated treatment well Patient left: in chair;with call bell/phone within reach;with chair alarm set;with SCD's reapplied     Time: 1030-1058 PT Time Calculation (min) (ACUTE ONLY): 28 min  Charges:  $Gait Training: 8-22 mins $Therapeutic Exercise: 8-22 mins                    G Codes:      Herbert Phillips, PTA 02/06/2016, 11:36 AM

## 2016-02-06 NOTE — Progress Notes (Signed)
Subjective: 2 Days Post-Op Procedure(s) (LRB): INTRAMEDULLARY (IM) NAIL INTERTROCHANTRIC (Left) Patient reports pain as mild.  Pain in the patient's left knee has resolved. Patient is well, but has had some minor complaints of bowel distention Plan is to go Skilled nursing facility after hospital stay. Negative for chest pain and shortness of breath Fever: no Gastrointestinal:Negative for nausea and vomiting  Objective: Vital signs in last 24 hours: Temp:  [98 F (36.7 C)] 98 F (36.7 C) (08/15 1935) Pulse Rate:  [85-94] 94 (08/15 1935) Resp:  [18-19] 19 (08/15 1935) BP: (107-118)/(49-57) 108/51 (08/15 1935) SpO2:  [96 %-98 %] 96 % (08/15 1935)  Intake/Output from previous day:  Intake/Output Summary (Last 24 hours) at 02/06/16 0740 Last data filed at 02/06/16 [id number]  Gross per 24 hour  Intake              730 ml  Output             3200 ml  Net            -2470 ml    Intake/Output this shift: No intake/output data recorded.  Labs:  Recent Labs  02/05/16 0442 02/06/16 0639  HGB 12.1* 10.4*    Recent Labs  02/05/16 0442 02/06/16 0639  WBC 11.8* 13.8*  RBC 3.71* 3.22*  HCT 35.5* 30.3*  PLT 161 163    Recent Labs  02/05/16 0442 02/06/16 0639  NA 139 138  K 3.9 3.7  CL 105 106  CO2 27 27  BUN 16 12  CREATININE 0.93 0.88  GLUCOSE 177* 166*  CALCIUM 8.0* 8.1*   No results for input(s): LABPT, INR in the last 72 hours.   EXAM General - Patient is Alert, Appropriate and Oriented Extremity - ABD soft Sensation intact distally Intact pulses distally Dorsiflexion/Plantar flexion intact Incision: dressing C/D/I No cellulitis present Dressing/Incision - clean, dry, no drainage Motor Function - intact, moving foot and toes well on exam.   Negative Homan's to bilateral legs. Abdomen is distended this AM, bowel sounds are present but slightly diminished.  Pt reports some gas, but no BM.  Denies any N/V or abd pain.  Past Medical History:  Diagnosis Date   . Arthritis   . B12 deficiency   . Bladder cancer ([place])   . Dysrhythmia    Bradycardia  . Hip fracture, left ([place]) 02/04/2016  . Hypertension   . Non-insulin dependent type 2 diabetes mellitus ([place])   . Sleep apnea     Assessment/Plan: 2 Days Post-Op Procedure(s) (LRB): INTRAMEDULLARY (IM) NAIL INTERTROCHANTRIC (Left) Active Problems:   Closed left hip fracture (HCC)  Estimated body mass index is 23.73 kg/m as calculated from the following:   Height as of this encounter: 6' (1.829 m).   Weight as of this encounter: 79.4 kg (175 lb). Advance diet Up with therapy  BP 108/51, Holding BP medications. Labs reviewed, 13.8 WBC, 10.4 Hg.  WBC could be due to distention. Pt has not had a BM following surgery.  Abd is distended, will continue to monitor intake, if no improvement in distention today will obtain KUB. PT currently recommending SNF, prior to injury pt was living at home with his wife, independent with all activities.  DVT Prophylaxis - Lovenox, Foot Pumps and TED hose Weight-Bearing as tolerated to left leg  J. Cameron Proud, PA-C [facility] 02/06/2016, 7:40 AM

## 2016-02-06 NOTE — Progress Notes (Signed)
Pt had an unremarkable day. Abdomen is distended but soft with good bowel sounds and all 4 quadrants. Pt states the distension is his baseline. A prune juice cocktail was given as well as a dulcolax suppository. He has refused pain med's all day today.

## 2016-02-06 NOTE — Progress Notes (Signed)
Physical Therapy Treatment Patient Details Name: Herbert Phillips MRN: [id number] DOB: October 15, 1925 Today's Date: 02/06/2016    History of Present Illness Herbert Phillips  is a 82 y.o. male with a known history of Type 2 diabetes non-insulin-requiring who is presenting after mechanical fall. Patient states he finished a round of golf shoes were wet he fell injuring his left leg. He stated immediate pain in the left hip described only as "pain" 8/10 intensity no worsening or relieving factors. He does state however he is unable to move that left leg at this time. He denies any prior chest pain palpitation shortness of breath further symptomatology at this time. Pt underwent L hip IM nailing without reported post-op complications. This is the only fall he has suffered in the last 12 months.     PT Comments    Pt initially found sleeping in recliner. Easily awoken and agreeable to PT session. Pt demonstrates continued need for safety cues with sit to/from stand transfers as well as Min A to steady to attain stable upright posture with both hands on rolling walker. Pt able to demonstrate increased ambulation distance this afternoon, but requires heavy cueing for each step of sequence including quad set on left with stance phase and weightbearing through bilateral upper extremities. 2 episodes of mild left knee buckling with non compliance of sequencing despite cues requiring increased assist for stability, but no significant loss of balance. Encouraged continued work on exercises for improved range and strength to improve quality and safety of functional mobility.   Follow Up Recommendations  SNF     Equipment Recommendations  Rolling walker with 5" wheels    Recommendations for Other Services       Precautions / Restrictions Precautions Precautions: Fall Restrictions Weight Bearing Restrictions: Yes LLE Weight Bearing: Partial weight bearing LLE Partial Weight Bearing Percentage or  Pounds: 50%    Mobility  Bed Mobility Overal bed mobility: Needs Assistance Bed Mobility: Sit to Supine     Supine to sit: Min assist;Mod assist     General bed mobility comments: For LEs; use of trapeze for reposition in bed  Transfers Overall transfer level: Needs assistance Equipment used: Rolling walker (2 wheeled) Transfers: Sit to/from Stand Sit to Stand: Min assist         General transfer comment: Cues for hand placement to stand; Min A to steady/cues to bring second hand to rw for stable upright stand  Ambulation/Gait Ambulation/Gait assistance: Min assist Ambulation Distance (Feet): 20 Feet Assistive device: Rolling walker (2 wheeled) Gait Pattern/deviations: Step-to pattern (PWB L) Gait velocity: Decreased Gait velocity interpretation: <1.8 ft/sec, indicative of risk for recurrent falls General Gait Details: Requires heavy cues for each part of sequence to maintain technique and avoid L buckling. 2 episodes of L knee buckling due to non compliance with cues/sequence requiring mild increased in physical assist to avoid fall   Stairs            Wheelchair Mobility    Modified Rankin (Stroke Patients Only)       Balance Overall balance assessment: Needs assistance Sitting-balance support: Feet supported Sitting balance-Leahy Scale: Good Sitting balance - Comments: cues initially to keep RLE on ground for improved support   Standing balance support: Bilateral upper extremity supported Standing balance-Leahy Scale: Fair Standing balance comment: mild increased unsteadiness when non compliance with sequenc                    Cognition Arousal/Alertness: Awake/alert (once awoken) Behavior  During Therapy: WFL for tasks assessed/performed (mildly impulsive) Overall Cognitive Status: Within Functional Limits for tasks assessed                      Exercises General Exercises - Lower Extremity Ankle Circles/Pumps: AROM;Both;20 reps  (long sit) Quad Sets: Strengthening;Both;Supine;20 reps Gluteal Sets: Strengthening;Both;20 reps;Supine Long Arc Quad: AAROM;Left;10 reps;Seated (2 sets) Heel Slides: AAROM;Left;20 reps;Supine Hip ABduction/ADduction: AROM;AAROM;Left;20 reps (assist primarily to hold leg up from shearing resist on bed) Hip Flexion/Marching: AAROM;Left;Seated;20 reps Other Exercises Other Exercises: adductor squeeze 20x seated; pillow    General Comments        Pertinent Vitals/Pain Pain Assessment: 0-10 Pain Score: 6     Home Living                      Prior Function            PT Goals (current goals can now be found in the care plan section) Progress towards PT goals: Progressing toward goals    Frequency  BID    PT Plan Current plan remains appropriate    Co-evaluation             End of Session Equipment Utilized During Treatment: Gait belt Activity Tolerance: Patient tolerated treatment well Patient left: in chair;with call bell/phone within reach;with chair alarm set;with SCD's reapplied     Time: 1326-1401 PT Time Calculation (min) (ACUTE ONLY): 35 min  Charges:  $Gait Training: 8-22 mins $Therapeutic Exercise: 8-22 mins                    G Codes:      Charlaine Dalton, PTA 02/06/2016, 3:28 PM

## 2016-02-06 NOTE — Progress Notes (Signed)
Plan is for patient to D/C to [facility] tomorrow 02/07/16 pending medical clearance.  [employer], LCSW [phone]

## 2016-02-07 LAB — BASIC METABOLIC PANEL
Anion gap: 6 (ref 5–15)
BUN: 14 mg/dL (ref 6–20)
CO2: 29 mmol/L (ref 22–32)
Calcium: 8.4 mg/dL — ABNORMAL LOW (ref 8.9–10.3)
Chloride: 102 mmol/L (ref 101–111)
Creatinine, Ser: 0.94 mg/dL (ref 0.61–1.24)
GFR calc Af Amer: >60 mL/min (ref >60)
GLUCOSE: 196 mg/dL — AB (ref 65–99)
POTASSIUM: 4.2 mmol/L (ref 3.5–5.1)
Sodium: 137 mmol/L (ref 135–145)

## 2016-02-07 LAB — CBC
HEMATOCRIT: 31.6 % — AB (ref 40.0–52.0)
HEMOGLOBIN: 10.8 g/dL — AB (ref 13.0–18.0)
MCH: 32.5 pg (ref 26.0–34.0)
MCHC: 34.1 g/dL (ref 32.0–36.0)
MCV: 95.3 fL (ref 80.0–100.0)
Platelets: 180 10*3/uL (ref 150–440)
RBC: 3.32 MIL/uL — ABNORMAL LOW (ref 4.40–5.90)
RDW: 13.9 % (ref 11.5–14.5)
WBC: 15.8 10*3/uL — ABNORMAL HIGH (ref 3.8–10.6)

## 2016-02-07 LAB — GLUCOSE, CAPILLARY
Glucose-Capillary: 202 mg/dL — ABNORMAL HIGH (ref 65–99)
Glucose-Capillary: 255 mg/dL — ABNORMAL HIGH (ref 65–99)

## 2016-02-07 MED ORDER — MAGNESIUM HYDROXIDE 400 MG/5ML PO SUSP: 30.0000 mL | Freq: Every day | ORAL | 0 refills | Status: DC | PRN

## 2016-02-07 MED ORDER — DOCUSATE SODIUM 100 MG PO CAPS: 100.0000 mg | ORAL_CAPSULE | Freq: Two times a day (BID) | ORAL | 0 refills | Status: DC

## 2016-02-07 MED ORDER — OXYCODONE HCL 5 MG PO TABS: 5.0000 mg | ORAL_TABLET | ORAL | 0 refills | Status: DC | PRN

## 2016-02-07 MED ORDER — DIPHENHYDRAMINE HCL 12.5 MG/5ML PO ELIX: 12.5000 mg | ORAL_SOLUTION | ORAL | 0 refills | Status: DC | PRN

## 2016-02-07 MED ORDER — BACITRACIN ZINC 500 UNIT/GM EX OINT: TOPICAL_OINTMENT | Freq: Two times a day (BID) | CUTANEOUS | 0 refills | Status: DC

## 2016-02-07 MED ORDER — ENOXAPARIN SODIUM 40 MG/0.4ML ~~LOC~~ SOLN: 40.0000 mg | SUBCUTANEOUS | 0 refills | Status: DC

## 2016-02-07 NOTE — Discharge Summary (Signed)
[facility] NAME: Herbert Phillips    MR#:  [id number]  DATE OF BIRTH:  Jun 04, 1926  DATE OF ADMISSION:  02/04/2016 ADMITTING PHYSICIAN: Corky Mull, MD  DATE OF DISCHARGE: 02/07/16  PRIMARY CARE PHYSICIAN: Albina Billet, MD    ADMISSION DIAGNOSIS:  Fall [W19.XXXA] Fall with injury [T14.90, W19.XXXA] Closed left hip fracture, initial encounter ([place]) [S72.002A] Skin tear of elbow without complication, left, initial encounter [S51.012A]  DISCHARGE DIAGNOSIS:  Active Problems:   Closed left hip fracture ([place])   SECONDARY DIAGNOSIS:   Past Medical History:  Diagnosis Date  . Arthritis   . B12 deficiency   . Bladder cancer ([place])   . Dysrhythmia    Bradycardia  . Hip fracture, left ([place]) 02/04/2016  . Hypertension   . Non-insulin dependent type 2 diabetes mellitus ([place])   . Sleep apnea     HOSPITAL COURSE:  Herbert Phillips  is a 82 y.o. male admitted 02/04/2016 with chief complaint Leg Injury . Please see H&P performed by Corky Mull, MD for further information. Patient presented after mechanical fall with subsequent hip fracture. 02/04/16 Reduction and internal fixation of displaced comminuted subtrochanteric left hip fracture with [employer] Affyxis TFN nail. Patient without complications  DISCHARGE CONDITIONS:   stable  CONSULTS OBTAINED:  Treatment Team:  Corky Mull, MD Lytle Butte, MD  DRUG ALLERGIES:  No Known Allergies  DISCHARGE MEDICATIONS:   Current Discharge Medication List    START taking these medications   Details  bacitracin ointment Apply topically 2 (two) times daily. Qty: 120 g, Refills: 0    diphenhydrAMINE (BENADRYL) 12.5 MG/5ML elixir Take 5-10 mLs (12.5-25 mg total) by mouth every 4 (four) hours as needed for itching. Qty: 120 mL, Refills: 0    docusate sodium (COLACE) 100 MG capsule Take 1 capsule (100 mg total) by mouth 2 (two) times daily. Qty: 10 capsule, Refills: 0      enoxaparin (LOVENOX) 40 MG/0.4ML injection Inject 0.4 mLs (40 mg total) into the skin daily. Qty: 14 Syringe, Refills: 0    magnesium hydroxide (MILK OF MAGNESIA) 400 MG/5ML suspension Take 30 mLs by mouth daily as needed for mild constipation. Qty: 360 mL, Refills: 0    oxyCODONE (OXY IR/ROXICODONE) 5 MG immediate release tablet Take 1-2 tablets (5-10 mg total) by mouth every 3 (three) hours as needed for breakthrough pain. Qty: 30 tablet, Refills: 0      CONTINUE these medications which have NOT CHANGED   Details  allopurinol (ZYLOPRIM) 100 MG tablet Take 100 mg by mouth daily.     aspirin EC 81 MG tablet Take 81 mg by mouth daily.    Associated Diagnoses: Malignant neoplasm of urinary bladder, unspecified site (HCC)    Glucosamine Sulfate (GLUCOSAMINE RELIEF) 1000 MG TABS Take 1,000 mg by mouth daily.    Associated Diagnoses: Malignant neoplasm of urinary bladder, unspecified site (HCC)    glyBURIDE (DIABETA) 5 MG tablet Take 5 mg by mouth daily with breakfast.    lovastatin (MEVACOR) 20 MG tablet Take 20 mg by mouth daily at 6 PM.    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     mirabegron ER (MYRBETRIQ) 25 MG TB24 tablet Take 1 tablet (25 mg total) by mouth daily. Qty: 30 tablet, Refills: 5   Associated Diagnoses: Malignant  neoplasm of urinary bladder, unspecified site (HCC)    Nutritional Supplements (BLADDER 2.2) TABS Take 1 tablet by mouth daily.    Associated Diagnoses: Malignant neoplasm of urinary bladder, unspecified site (HCC)    ramipril (ALTACE) 2.5 MG capsule Take 2.5 mg by mouth daily.    tamsulosin (FLOMAX) 0.4 MG CAPS capsule Take 0.4 mg by mouth daily.    Associated Diagnoses: Malignant neoplasm of lateral wall of urinary bladder (HCC)    vitamin B-12 (CYANOCOBALAMIN) 1000 MCG tablet Take 1,000 mcg by mouth daily.       STOP taking these medications     furosemide (LASIX) 20 MG tablet          DISCHARGE INSTRUCTIONS:    Staples can be removed on 02/18/16.  Lovenox 40mg  daily x 14 days for DVT prophylaxis. Follow-up with either Dr. Roland Rack or Cameron Proud, PA-C with [facility] in 4-6 weeks for x-rays of the of left hip.  DIET:  Cardiac diet  DISCHARGE CONDITION:  Stable  ACTIVITY:  Precautions Precautions: Fall Restrictions Weight Bearing Restrictions: Yes LLE Weight Bearing: Partial weight bearing LLE Partial Weight Bearing Percentage or Pounds: 50%   OXYGEN:  Home Oxygen: No.   Oxygen Delivery: room air  DISCHARGE LOCATION:  nursing home   If you experience worsening of your admission symptoms, develop shortness of breath, life threatening emergency, suicidal or homicidal thoughts you must seek medical attention immediately by calling 911 or calling your MD immediately  if symptoms less severe.  You Must read complete instructions/literature along with all the possible adverse reactions/side effects for all the Medicines you take and that have been prescribed to you. Take any new Medicines after you have completely understood and accpet all the possible adverse reactions/side effects.   Please note  You were cared for by a hospitalist during your hospital stay. If you have any questions about your discharge medications or the care you received while you were in the hospital after you are discharged, you can call the unit and asked to speak with the hospitalist on call if the hospitalist that took care of you is not available. Once you are discharged, your primary care physician will handle any further medical issues. Please note that NO REFILLS for any discharge medications will be authorized once you are discharged, as it is imperative that you return to your primary care physician (or establish a relationship with a primary care physician if you do not have one) for your aftercare  needs so that they can reassess your need for medications and monitor your lab values.    On the day of Discharge:   VITAL SIGNS:  Blood pressure (!) 125/47, pulse 95, temperature 98.5 F (36.9 C), resp. rate 18, height 6' (1.829 m), weight 79.4 kg (175 lb), SpO2 94 %.  I/O:   Intake/Output Summary (Last 24 hours) at 02/07/16 1010 Last data filed at 02/07/16 0940  Gross per 24 hour  Intake              840 ml  Output             1500 ml  Net             -660 ml    PHYSICAL EXAMINATION:  GENERAL:  82 y.o.-year-old patient lying in the bed with no acute distress.  EYES: Pupils equal, round, reactive to light and accommodation. No scleral icterus. Extraocular muscles intact.  HEENT: Head atraumatic, normocephalic. Oropharynx and nasopharynx clear.  NECK:  Supple, no jugular venous distention. No thyroid enlargement, no tenderness.  LUNGS: Normal breath sounds bilaterally, no wheezing, rales,rhonchi or crepitation. No use of accessory muscles of respiration.  CARDIOVASCULAR: S1, S2 normal. No murmurs, rubs, or gallops.  ABDOMEN: Soft, non-tender, non-distended. Bowel sounds present. No organomegaly or mass.  EXTREMITIES: No pedal edema, cyanosis, or clubbing.  NEUROLOGIC: Cranial nerves II through XII are intact. Muscle strength 5/5 in all extremities. Sensation intact. Gait not checked.  PSYCHIATRIC: The patient is alert and oriented x 3.  SKIN: No obvious rash, lesion, or ulcer.   DATA REVIEW:   CBC  Recent Labs Lab 02/07/16 0536  WBC 15.8*  HGB 10.8*  HCT 31.6*  PLT 180    Chemistries   Recent Labs Lab 02/07/16 0536  NA 137  K 4.2  CL 102  CO2 29  GLUCOSE 196*  BUN 14  CREATININE 0.94  CALCIUM 8.4*    Cardiac Enzymes No results for input(s): TROPONINI in the last 168 hours.  Microbiology Results  Results for orders placed or performed during the hospital encounter of 02/04/16  Surgical pcr screen     Status: None   Collection Time: 02/04/16  4:10 PM    Result Value Ref Range Status   MRSA, PCR NEGATIVE NEGATIVE Final   Staphylococcus aureus NEGATIVE NEGATIVE Final    Comment:        The Xpert SA Assay (FDA approved for NASAL specimens in patients over 25 years of age), is one component of a comprehensive surveillance program.  Test performance has been validated by [facility] for patients greater than or equal to 55 year old. It is not intended to diagnose infection nor to guide or monitor treatment.     RADIOLOGY:  No results found.   Management plans discussed with the patient, family and they are in agreement.  CODE STATUS:     Code Status Orders        Start     Ordered   02/04/16 1448  Full code  Continuous     02/04/16 1449    Code Status History    Date Active Date Inactive Code Status Order ID Comments User Context   02/04/2016  2:49 PM 02/06/2016 10:50 AM Full Code [id number]  Lytle Butte, MD ED      TOTAL TIME TAKING CARE OF THIS PATIENT: 32 minutes.    Hower,  Karenann Cai.D on 02/07/2016 at 10:10 AM  Between 7am to 6pm - Pager - [phone]  After 6pm go to [email or web address] - Proofreader  [facility]  Office  [phone]  CC: Primary care physician; Albina Billet, MD

## 2016-02-07 NOTE — Progress Notes (Signed)
Physical Therapy Treatment Patient Details Name: Herbert Phillips MRN: [id number] DOB: 1926-02-10 Today's Date: 02/07/2016    History of Present Illness Herbert Phillips  is a 82 y.o. male with a known history of Type 2 diabetes non-insulin-requiring who is presenting after mechanical fall. Patient states he finished a round of golf shoes were wet he fell injuring his left leg. He stated immediate pain in the left hip described only as "pain" 8/10 intensity no worsening or relieving factors. He does state however he is unable to move that left leg at this time. He denies any prior chest pain palpitation shortness of breath further symptomatology at this time. Pt underwent L hip IM nailing without reported post-op complications. This is the only fall he has suffered in the last 12 months.     PT Comments    Pt in chair.  Reported poor sleep last night.  Participated in exercises as described below.  Pt stood x 1 with mod a x 1 and was falling to right/post.  2nd assist obtained for safety.  He stood and was able to amb 6' x 2 with min/mod a x 1 and wc follow.  Pt attributed decreased gait quality to general fatigue today.   Follow Up Recommendations  SNF     Equipment Recommendations  Rolling walker with 5" wheels    Recommendations for Other Services       Precautions / Restrictions Precautions Precautions: Fall Restrictions Weight Bearing Restrictions: Yes LLE Weight Bearing: Partial weight bearing LLE Partial Weight Bearing Percentage or Pounds: 50%    Mobility  Bed Mobility               General bed mobility comments: in chiar upon arrival  Transfers Overall transfer level: Needs assistance Equipment used: Rolling walker (2 wheeled) Transfers: Sit to/from Stand Sit to Stand: Min assist         General transfer comment: cues for hand placements and to stand fully  Ambulation/Gait Ambulation/Gait assistance: Min assist;Mod assist Ambulation Distance (Feet):  6 Feet (x 2) Assistive device: Rolling walker (2 wheeled) Gait Pattern/deviations: Step-to pattern Gait velocity: Decreased Gait velocity interpretation: Below normal speed for age/gender General Gait Details: heavy cues, falling to right and back   Stairs            Wheelchair Mobility    Modified Rankin (Stroke Patients Only)       Balance Overall balance assessment: Needs assistance Sitting-balance support: Feet supported Sitting balance-Leahy Scale: Good     Standing balance support: Bilateral upper extremity supported Standing balance-Leahy Scale: Poor                      Cognition Arousal/Alertness: Awake/alert Behavior During Therapy: WFL for tasks assessed/performed Overall Cognitive Status: Within Functional Limits for tasks assessed                      Exercises General Exercises - Lower Extremity Ankle Circles/Pumps: AROM;Both;20 reps Quad Sets: Strengthening;Both;Supine;20 reps Gluteal Sets: Strengthening;Both;20 reps;Supine Long Arc Quad: AAROM;Left;10 reps;Seated Straight Leg Raises: AROM;Left;10 reps;Standing Hip Flexion/Marching: AROM;Left;Standing    General Comments        Pertinent Vitals/Pain Pain Assessment: 0-10 Pain Score: 2  Pain Location: L thigh/knee Pain Descriptors / Indicators: Aching Pain Intervention(s): Monitored during session    Home Living                      Prior Function  PT Goals (current goals can now be found in the care plan section)      Frequency  BID    PT Plan Current plan remains appropriate    Co-evaluation             End of Session Equipment Utilized During Treatment: Gait belt Activity Tolerance: Patient tolerated treatment well Patient left: in chair;with call bell/phone within reach;with chair alarm set;with SCD's reapplied     Time: [id number] PT Time Calculation (min) (ACUTE ONLY): 23 min  Charges:  $Gait Training: 8-22  mins $Therapeutic Exercise: 8-22 mins                    G Codes:      Chesley Noon 2016/02/28, 10:29 AM

## 2016-02-07 NOTE — Clinical Social Work Placement (Signed)
   CLINICAL SOCIAL WORK PLACEMENT  NOTE  Date:  02/07/2016  Patient Details  Name: Herbert Phillips MRN: [id number] Date of Birth: 17-Jul-1925  Clinical Social Work is seeking post-discharge placement for this patient at the [place] level of care (*CSW will initial, date and re-position this form in  chart as items are completed):  Yes   Patient/family provided with [place] Work Department's list of facilities offering this level of care within the geographic area requested by the patient (or if unable, by the patient's family).  Yes   Patient/family informed of their freedom to choose among providers that offer the needed level of care, that participate in Medicare, Medicaid or managed care program needed by the patient, have an available bed and are willing to accept the patient.  Yes   Patient/family informed of [facility]'s ownership interest in [facility] and [facility], as well as of the fact that they are under no obligation to receive care at these facilities.  PASRR submitted to EDS on 02/05/16     PASRR number received on 02/05/16     Existing PASRR number confirmed on       FL2 transmitted to all facilities in geographic area requested by pt/family on 02/05/16     FL2 transmitted to all facilities within larger geographic area on       Patient informed that his/her managed care company has contracts with or will negotiate with certain facilities, including the following:        Yes   Patient/family informed of bed offers received.  Patient chooses bed at  [facility] )     Physician recommends and patient chooses bed at      Patient to be transferred to  [facility] ) on 02/07/16.  Patient to be transferred to facility by  [facility] )     Patient family notified on 02/07/16 of transfer.  Name of family member notified:   (Patient's son Herbert Phillips is at bedside and aware of D/C today. )      PHYSICIAN       Additional Comment:    _______________________________________________ Donielle Radziewicz, Veronia Beets, LCSW 02/07/2016, 11:03 AM

## 2016-02-07 NOTE — Progress Notes (Signed)
Pt discharge to skilled facility.

## 2016-02-07 NOTE — Progress Notes (Signed)
Subjective: 3 Days Post-Op Procedure(s) (LRB): INTRAMEDULLARY (IM) NAIL INTERTROCHANTRIC (Left) Patient reports pain as mild.  Pt did not take any pain medications yesterday. Patient is well, and has had no acute complaints or problems Plan is to go Skilled nursing facility after hospital stay. Negative for chest pain and shortness of breath Fever: no Gastrointestinal:Negative for nausea and vomiting  Objective: Vital signs in last 24 hours: Temp:  [97.8 F (36.6 C)-99 F (37.2 C)] 97.8 F (36.6 C) (08/17 0342) Pulse Rate:  [84-100] 95 (08/17 0342) Resp:  [16-19] 18 (08/17 0342) BP: (100-131)/(33-53) 100/52 (08/17 0342) SpO2:  [90 %-97 %] 96 % (08/17 0342)  Intake/Output from previous day:  Intake/Output Summary (Last 24 hours) at 02/07/16 0729 Last data filed at 02/07/16 0620  Gross per 24 hour  Intake              840 ml  Output             1650 ml  Net             -810 ml    Intake/Output this shift: No intake/output data recorded.  Labs:  Recent Labs  02/05/16 0442 02/06/16 0639 02/07/16 0536  HGB 12.1* 10.4* 10.8*    Recent Labs  02/06/16 0639 02/07/16 0536  WBC 13.8* 15.8*  RBC 3.22* 3.32*  HCT 30.3* 31.6*  PLT 163 180    Recent Labs  02/06/16 0639 02/07/16 0536  NA 138 137  K 3.7 4.2  CL 106 102  CO2 27 29  BUN 12 14  CREATININE 0.88 0.94  GLUCOSE 166* 196*  CALCIUM 8.1* 8.4*   No results for input(s): LABPT, INR in the last 72 hours.   EXAM General - Patient is Alert, Appropriate and Oriented Extremity - ABD soft Sensation intact distally Intact pulses distally Dorsiflexion/Plantar flexion intact Incision: dressing C/D/I No cellulitis present Dressing/Incision - clean, dry, no drainage Motor Function - intact, moving foot and toes well on exam.   Negative Homan's to bilateral legs. Abdomen is distended this AM, bowel sounds are normal, denies any N/V.  Pt has had two BM since yesterday.  Past Medical History:  Diagnosis Date   . Arthritis   . B12 deficiency   . Bladder cancer ([place])   . Dysrhythmia    Bradycardia  . Hip fracture, left ([place]) 02/04/2016  . Hypertension   . Non-insulin dependent type 2 diabetes mellitus ([place])   . Sleep apnea     Assessment/Plan: 3 Days Post-Op Procedure(s) (LRB): INTRAMEDULLARY (IM) NAIL INTERTROCHANTRIC (Left) Active Problems:   Closed left hip fracture (HCC)  Estimated body mass index is 23.73 kg/m as calculated from the following:   Height as of this encounter: 6' (1.829 m).   Weight as of this encounter: 79.4 kg (175 lb). Advance diet Up with therapy  BP 100/52, Holding BP medications. Labs reviewed, 15.8 WBC, Hg 10.8.  No signs of infection, no cough, lung auscultation normal, pt has had several BMs and pt also states that the level of distention is normal for him. PT currently recommending SNF, prior to injury pt was living at home with his wife, independent with all activities.  Plan will be for discharge today pending medical clearance. Staples can be removed on 02/18/16.  Lovenox 40mg  daily x 14 days for DVT prophylaxis. Follow-up with either Dr. Roland Rack or Cameron Proud, PA-C with [facility] in 4-6 weeks for x-rays of the of left hip.  DVT Prophylaxis - Lovenox, Foot Pumps and  TED hose Weight-Bearing as tolerated to left leg  J. Cameron Proud, PA-C [facility] 02/07/2016, 7:29 AM

## 2016-02-07 NOTE — Care Management Important Message (Signed)
Important Message  Patient Details  Name: Herbert Phillips MRN: [id number] Date of Birth: Oct 27, 1925   Medicare Important Message Given:  Yes    Katrina Stack, RN 02/07/2016, 10:25 AM

## 2016-02-07 NOTE — Progress Notes (Signed)
Patient is medically stable for D/C to [facility] today. Per Kim admissions coordinator at [facility] patient will go to room 344. RN will call report at [phone] and arrange EMS for transport. Clinical Education officer, museum (CSW) sent D/C orders to [employer] [facility] via [employer]. Patient's son Ed is at bedside and aware of above. Please reconsult if future social work needs arise. CSW signing off.   [employer], LCSW [phone]

## 2016-02-12 LAB — GLUCOSE, CAPILLARY: Glucose-Capillary: 133 mg/dL — ABNORMAL HIGH (ref 65–99)

## 2016-02-14 LAB — URINALYSIS COMPLETE WITH MICROSCOPIC (ARMC ONLY)
Bacteria, UA: NONE SEEN
Bilirubin Urine: NEGATIVE
GLUCOSE, UA: 50 mg/dL — AB
Hgb urine dipstick: NEGATIVE
Ketones, ur: NEGATIVE mg/dL
Leukocytes, UA: NEGATIVE
Nitrite: NEGATIVE
Protein, ur: NEGATIVE mg/dL
SPECIFIC GRAVITY, URINE: 1.017 (ref 1.005–1.030)
SQUAMOUS EPITHELIAL / LPF: NONE SEEN
pH: 5 (ref 5.0–8.0)

## 2016-02-16 LAB — URINE CULTURE: CULTURE: NO GROWTH

## 2016-02-20 ENCOUNTER — Non-Acute Institutional Stay (SKILLED_NURSING_FACILITY): Payer: Medicare Other | Admitting: Gerontology

## 2016-02-20 DIAGNOSIS — I82402 Acute embolism and thrombosis of unspecified deep veins of left lower extremity: Secondary | ICD-10-CM

## 2016-02-22 ENCOUNTER — Non-Acute Institutional Stay (SKILLED_NURSING_FACILITY): Payer: Medicare Other | Admitting: Gerontology

## 2016-02-22 ENCOUNTER — Encounter
Admission: RE | Admit: 2016-02-22 | Discharge: 2016-02-22 | Disposition: A | Discharge status: Home / Self Care | Payer: Medicare Other | Source: Ambulatory Visit | Attending: Internal Medicine | Admitting: Internal Medicine

## 2016-02-22 DIAGNOSIS — I82402 Acute embolism and thrombosis of unspecified deep veins of left lower extremity: Secondary | ICD-10-CM

## 2016-02-23 DIAGNOSIS — I82402 Acute embolism and thrombosis of unspecified deep veins of left lower extremity: Secondary | ICD-10-CM | POA: Insufficient documentation

## 2016-02-23 LAB — GLUCOSE, CAPILLARY: GLUCOSE-CAPILLARY: 73 mg/dL (ref 65–99)

## 2016-02-23 NOTE — Progress Notes (Signed)
Location:      Place of Service:  SNF (31) Provider:  Toni Arthurs, NP-C  Albina Billet, MD  Patient Care Team: Albina Billet, MD as PCP - General (Internal Medicine) Royston Cowper, MD as Consulting Physician (Urology)  Extended Emergency Contact Information Primary Emergency Contact: [place] Phone: [phone] Mobile Phone: [phone] Relation: Son Secondary Emergency Contact: Pangallo,Clarice L Address: [address]          [place], [place] [address] [place] Phone: [phone] Relation: Spouse  Code Status:  dnr Goals of care: Advanced Directive information Advanced Directives 02/04/2016  Does patient have an advance directive? Yes  Type of Advance Directive -  Does patient want to make changes to advanced directive? -  Copy of advanced directive(s) in chart? No - copy requested     Chief Complaint  Patient presents with  . Acute Visit    HPI:  Pt is a 82 y.o. male seen today for an acute visit for pain, swelling, warmth of the left ankle. Pt underwent and ORIF of Left Hip fracture. Pt has been doing well with therapy and not in any pain. Pt started having increased pain, swelling in the ankle. Pt is on Lovenox for DVT prophylaxis. Pt also has a h/o gout. No c/o n/v/d/f/c/cp/sob/ha/abd pain. No other complaints. VSS   Past Medical History:  Diagnosis Date  . Arthritis   . B12 deficiency   . Bladder cancer ([place])   . Dysrhythmia    Bradycardia  . Hip fracture, left ([place]) 02/04/2016  . Hypertension   . Non-insulin dependent type 2 diabetes mellitus ([place])   . Sleep apnea    Past Surgical History:  Procedure Laterality Date  . cyst on spine  1961  . EAR CYST EXCISION    . INTRAMEDULLARY (IM) NAIL INTERTROCHANTERIC Left 02/04/2016   Procedure: INTRAMEDULLARY (IM) NAIL INTERTROCHANTRIC;  Surgeon: Corky Mull, MD;  Location: [facility] ORS;  Service: Orthopedics;  Laterality: Left;  . TRANSURETHRAL RESECTION OF  BLADDER TUMOR N/A 10/24/2014   Procedure: TRANSURETHRAL RESECTION OF BLADDER TUMOR (TURBT);  Surgeon: Royston Cowper, MD;  Location: [facility] ORS;  Service: Urology;  Laterality: N/A;    No Known Allergies    Medication List       Accurate as of 02/20/16 11:59 PM. Always use your most recent med list.          allopurinol 100 MG tablet Commonly known as:  ZYLOPRIM Take 100 mg by mouth daily.   aspirin EC 81 MG tablet Take 81 mg by mouth daily.   bacitracin ointment Apply topically 2 (two) times daily.   BLADDER 2.2 Tabs Take 1 tablet by mouth daily.   diphenhydrAMINE 12.5 MG/5ML elixir Commonly known as:  BENADRYL Take 5-10 mLs (12.5-25 mg total) by mouth every 4 (four) hours as needed for itching.   docusate sodium 100 MG capsule Commonly known as:  COLACE Take 1 capsule (100 mg total) by mouth 2 (two) times daily.   enoxaparin 40 MG/0.4ML injection Commonly known as:  LOVENOX Inject 0.4 mLs (40 mg total) into the skin daily.   GLUCOSAMINE RELIEF 1000 MG Tabs Generic drug:  Glucosamine Sulfate Take 1,000 mg by mouth daily.   glyBURIDE 5 MG tablet Commonly known as:  DIABETA Take 5 mg by mouth daily with breakfast.   lovastatin 20 MG tablet Commonly known as:  MEVACOR Take 20 mg by mouth daily at 6 PM.   magnesium hydroxide  400 MG/5ML suspension Commonly known as:  MILK OF MAGNESIA Take 30 mLs by mouth daily as needed for mild constipation.   metFORMIN 500 MG tablet Commonly known as:  GLUCOPHAGE Take 500 mg by mouth 2 (two) times daily with a meal.   mirabegron ER 25 MG Tb24 tablet Commonly known as:  MYRBETRIQ Take 1 tablet (25 mg total) by mouth daily.   oxyCODONE 5 MG immediate release tablet Commonly known as:  Oxy IR/ROXICODONE Take 1-2 tablets (5-10 mg total) by mouth every 3 (three) hours as needed for breakthrough pain.   ramipril 2.5 MG capsule Commonly known as:  ALTACE Take 2.5 mg by mouth daily.   tamsulosin 0.4 MG Caps capsule Commonly  known as:  FLOMAX Take 0.4 mg by mouth daily.   vitamin B-12 1000 MCG tablet Commonly known as:  CYANOCOBALAMIN Take 1,000 mcg by mouth daily.       Review of Systems  Constitutional: Negative for activity change, appetite change, chills, diaphoresis and fever.  HENT: Negative for congestion, sneezing, sore throat, trouble swallowing and voice change.   Respiratory: Negative for apnea, cough, choking, chest tightness, shortness of breath and wheezing.   Cardiovascular: Negative for chest pain, palpitations and leg swelling.  Gastrointestinal: Negative for abdominal distention, abdominal pain, constipation, diarrhea and nausea.  Genitourinary: Negative for difficulty urinating, dysuria, frequency and urgency.  Musculoskeletal: Positive for joint swelling (left ankle). Negative for back pain, gait problem and myalgias. Arthralgias: typical arthritis.  Skin: Positive for color change (left ankle- redness). Negative for pallor, rash and wound.  Neurological: Negative for dizziness, tremors, syncope, speech difficulty, weakness, numbness and headaches.  Psychiatric/Behavioral: Negative for agitation and behavioral problems.  All other systems reviewed and are negative.   Immunization History  Administered Date(s) Administered  . Tdap 02/04/2016   Pertinent  Health Maintenance Due  Topic Date Due  . HEMOGLOBIN A1C  03-22-[birth year]  . FOOT EXAM  05/05/1936  . OPHTHALMOLOGY EXAM  05/05/1936  . PNA vac Low Risk Adult (1 of 2 - PCV13) 05/05/1993  . INFLUENZA VACCINE  01/22/2016   Fall Risk  08/10/2015 03/09/2015  Falls in the past year? No No   Functional Status Survey:    Vitals:   02/20/16 0500  BP: (!) 104/49  Pulse: 80  Resp: 18  Temp: 97.7 F (36.5 C)  SpO2: 97%   There is no height or weight on file to calculate BMI. Physical Exam  Constitutional: He is oriented to person, place, and time. Vital signs are normal. He appears well-developed and well-nourished. He is active and  cooperative. He does not appear ill. No distress.  HENT:  Head: Normocephalic and atraumatic.  Mouth/Throat: Uvula is midline, oropharynx is clear and moist and mucous membranes are normal. Mucous membranes are not pale, not dry and not cyanotic.  Eyes: Conjunctivae, EOM and lids are normal. Pupils are equal, round, and reactive to light.  Neck: Trachea normal, normal range of motion and full passive range of motion without pain. Neck supple. No JVD present. No tracheal deviation, no edema and no erythema present. No thyromegaly present.  Cardiovascular: Normal rate, regular rhythm, normal heart sounds, intact distal pulses and normal pulses.  Exam reveals no gallop, no distant heart sounds and no friction rub.   No murmur heard. Pulmonary/Chest: Effort normal and breath sounds normal. No accessory muscle usage. No respiratory distress. He has no wheezes. He has no rales. He exhibits no tenderness.  Abdominal: Normal appearance and bowel sounds are normal. He  exhibits no distension and no ascites. There is no tenderness.  Musculoskeletal: Normal range of motion. He exhibits no edema or tenderness.  Expected osteoarthritis, stiffness  Neurological: He is alert and oriented to person, place, and time. He has normal strength.  Skin: Skin is warm, dry and intact. Bruising and ecchymosis noted. He is not diaphoretic. There is erythema (left ankle). No cyanosis. No pallor. Nails show no clubbing.     Psychiatric: He has a normal mood and affect. His speech is normal and behavior is normal. Judgment and thought content normal. Cognition and memory are normal.  Nursing note and vitals reviewed.   Labs reviewed:  Recent Labs  02/05/16 0442 02/06/16 0639 02/07/16 0536  NA 139 138 137  K 3.9 3.7 4.2  CL 105 106 102  CO2 '27 27 29  '$ GLUCOSE 177* 166* 196*  BUN '16 12 14  '$ CREATININE 0.93 0.88 0.94  CALCIUM 8.0* 8.1* 8.4*    Recent Labs  02/27/15 1430 04/10/15 1452 11/27/15 1249  AST '20 21  25  '$ ALT 15* 14* 18  ALKPHOS 72 54 54  BILITOT 0.4 0.5 0.5  PROT 7.4 7.0 7.1  ALBUMIN 3.9 3.5 3.9    Recent Labs  02/27/15 1430 04/10/15 1452 11/27/15 1249 02/05/16 0442 02/06/16 0639 02/07/16 0536  WBC 8.5 6.7 6.2 11.8* 13.8* 15.8*  NEUTROABS 5.7 5.1 4.4  --   --   --   HGB 14.4 13.3 14.8 12.1* 10.4* 10.8*  HCT 42.6 39.7* 43.0 35.5* 30.3* 31.6*  MCV 97.7 96.6 94.4 95.6 94.0 95.3  PLT 176 181 167 161 163 180   No results found for: TSH No results found for: HGBA1C No results found for: CHOL, HDL, LDLCALC, LDLDIRECT, TRIG, CHOLHDL  Significant Diagnostic Results in last 30 days:  Dg Chest 1 View  Result Date: 02/04/2016 CLINICAL DATA:  Femur fracture today. Preoperative respiratory exam. EXAM: CHEST 1 VIEW COMPARISON:  10/05/2014 FINDINGS: Heart size is normal. Mediastinal shadows are normal. The lungs are clear. No effusions. No acute bony finding. IMPRESSION: No active disease. Electronically Signed   By: Nelson Chimes M.D.   On: 02/04/2016 14:43   Dg Knee 1-2 Views Left  Result Date: 02/04/2016 CLINICAL DATA:  Pain following fall EXAM: LEFT KNEE - 1-2 VIEW COMPARISON:  None. FINDINGS: Frontal and lateral views were obtained. There is no acute fracture or dislocation. No joint effusion. There is narrowing medially and in the patellofemoral joint region. Spurs arise from the anterior patella. No erosive change. IMPRESSION: Areas of osteoarthritic change.  No fracture or joint effusion. Electronically Signed   By: Lowella Grip III M.D.   On: 02/04/2016 14:06   Ct Head Wo Contrast  Result Date: 02/04/2016 CLINICAL DATA:  Fall today.  Head and neck injury and pain. EXAM: CT HEAD WITHOUT CONTRAST CT CERVICAL SPINE WITHOUT CONTRAST TECHNIQUE: Multidetector CT imaging of the head and cervical spine was performed following the standard protocol without intravenous contrast. Multiplanar CT image reconstructions of the cervical spine were also generated. COMPARISON:  Head CT on  10/05/2014 FINDINGS: CT HEAD FINDINGS There is no evidence of intracranial hemorrhage, brain edema, or other signs of acute infarction. There is no evidence of intracranial mass lesion or mass effect. No abnormal extraaxial fluid collections are identified. Moderate diffuse cerebral atrophy and mild chronic small vessel disease are stable. Ventricles are stable in size. No evidence of skull fracture or pneumocephalus. CT CERVICAL SPINE FINDINGS No evidence of acute fracture, subluxation, or prevertebral soft tissue  swelling. Mild degenerative disc disease seen at levels of C5-6 and C6-7. Moderate to severe facet DJD seen mainly on the left from levels of C3 -C6. IMPRESSION: No acute intracranial abnormality. Stable moderate cerebral atrophy and mild chronic small vessel disease. No evidence of acute cervical spine fracture or subluxation. Degenerative spondylosis, as described above. Electronically Signed   By: Earle Gell M.D.   On: 02/04/2016 14:16   Ct Cervical Spine Wo Contrast  Result Date: 02/10/2016 CLINICAL DATA:  Fall today.  Head and neck injury and pain. EXAM: CT HEAD WITHOUT CONTRAST CT CERVICAL SPINE WITHOUT CONTRAST TECHNIQUE: Multidetector CT imaging of the head and cervical spine was performed following the standard protocol without intravenous contrast. Multiplanar CT image reconstructions of the cervical spine were also generated. COMPARISON:  Head CT on 10/05/2014 FINDINGS: CT HEAD FINDINGS There is no evidence of intracranial hemorrhage, brain edema, or other signs of acute infarction. There is no evidence of intracranial mass lesion or mass effect. No abnormal extraaxial fluid collections are identified. Moderate diffuse cerebral atrophy and mild chronic small vessel disease are stable. Ventricles are stable in size. No evidence of skull fracture or pneumocephalus. CT CERVICAL SPINE FINDINGS No evidence of acute fracture, subluxation, or prevertebral soft tissue swelling. Mild degenerative  disc disease seen at levels of C5-6 and C6-7. Moderate to severe facet DJD seen mainly on the left from levels of C3 -C6. IMPRESSION: No acute intracranial abnormality. Stable moderate cerebral atrophy and mild chronic small vessel disease. No evidence of acute cervical spine fracture or subluxation. Degenerative spondylosis, as described above. Electronically Signed   By: Earle Gell M.D.   On: 02/04/2016 14:16   Dg C-arm 1-60 Min  Result Date: 02/04/2016 CLINICAL DATA:  Comminuted subtrochanteric left hip fracture status post ORIF. EXAM: OPERATIVE left HIP (WITH PELVIS IF PERFORMED) 6 VIEWS TECHNIQUE: Fluoroscopic spot image(s) were submitted for interpretation post-operatively. COMPARISON:  Left hip radiographs from earlier in the same day. FINDINGS: Left I am rod is in place with a single distal interlocking screw. There is a dynamic femoral neck screw. Alignment is grossly anatomic. There is mild sub trochanteric displacement. IMPRESSION: 1. ORIF with intra medullary rod and dynamic femoral neck screw. 2. Mild persistent proximal displacement at the oblique subtrochanteric fracture. Electronically Signed   By: San Morelle M.D.   On: 02/04/2016 22:11   Dg Hip Operative Unilat W Or W/o Pelvis Left  Result Date: 02/04/2016 CLINICAL DATA:  Comminuted subtrochanteric left hip fracture status post ORIF. EXAM: OPERATIVE left HIP (WITH PELVIS IF PERFORMED) 6 VIEWS TECHNIQUE: Fluoroscopic spot image(s) were submitted for interpretation post-operatively. COMPARISON:  Left hip radiographs from earlier in the same day. FINDINGS: Left I am rod is in place with a single distal interlocking screw. There is a dynamic femoral neck screw. Alignment is grossly anatomic. There is mild sub trochanteric displacement. IMPRESSION: 1. ORIF with intra medullary rod and dynamic femoral neck screw. 2. Mild persistent proximal displacement at the oblique subtrochanteric fracture. Electronically Signed   By: San Morelle M.D.   On: 02/04/2016 22:11   Dg Hip Unilat With Pelvis 2-3 Views Left  Result Date: 02/04/2016 CLINICAL DATA:  Pain following fall EXAM: DG HIP (WITH OR WITHOUT PELVIS) 2-3V LEFT COMPARISON:  None. FINDINGS: Frontal pelvis well as frontal and lateral left hip images were obtained. There is a fracture of the proximal left femoral diaphysis, primarily inferior to the intertrochanteric region but with avulsion of the lesser trochanter. There is medial displacement of  distal major fracture fragments with respect to the major proximal fragment. No other areas of fracture. No dislocation. There is mild symmetric narrowing of both hip joints. No erosive change. IMPRESSION: Comminuted fracture proximal left femoral diaphysis with displaced major fracture fragments. There is avulsion of the lesser trochanter on the left. No other fractures. No dislocation. Mild symmetric narrowing both hip joints. Electronically Signed   By: Lowella Grip III M.D.   On: 02/04/2016 14:05    Assessment/Plan 1. Acute deep vein thrombosis (DVT) of left lower extremity, unspecified vein (HCC)  Give Lovenox 80 mg x 1 tonight  Start Xarelto 15 mg po BID x 21 days  Then change to Xarelto 20 mg po Q day  Pt to follow up with cardiologist for management beyond discharge  Heat (heating pad, k-pad) to LLE prn for pain  Tylenol and/or tramadol prn for pain  Monitor labs for thrombocytopenia, etc  Family/ staff Communication:   Total Time:  Documentation:  Face to Face:  Family/Phone:   Labs/tests ordered:  Left ankle xray, doppler LLE; Cbc, met c 1 week  Medication list reviewed and assessed for continued appropriateness.  Vikki Ports, NP-C [facility] [id number], [place] [address] Cell Phone (Mon-Fri 8am-5pm):  [phone] On Call:  [phone] & follow prompts after 5pm & weekends Office Phone:  [phone] Office Fax:   [phone]

## 2016-02-24 LAB — GLUCOSE, CAPILLARY: GLUCOSE-CAPILLARY: 84 mg/dL (ref 65–99)

## 2016-02-25 LAB — GLUCOSE, CAPILLARY: Glucose-Capillary: 87 mg/dL (ref 65–99)

## 2016-02-26 LAB — COMPREHENSIVE METABOLIC PANEL
ALBUMIN: 3.5 g/dL (ref 3.5–5.0)
ALK PHOS: 278 U/L — AB (ref 38–126)
ALT: 18 U/L (ref 17–63)
ANION GAP: 7 (ref 5–15)
AST: 18 U/L (ref 15–41)
BILIRUBIN TOTAL: 0.9 mg/dL (ref 0.3–1.2)
BUN: 21 mg/dL — ABNORMAL HIGH (ref 6–20)
CO2: 30 mmol/L (ref 22–32)
Calcium: 8.7 mg/dL — ABNORMAL LOW (ref 8.9–10.3)
Chloride: 102 mmol/L (ref 101–111)
Creatinine, Ser: 0.81 mg/dL (ref 0.61–1.24)
GFR calc Af Amer: >60 mL/min (ref >60)
GFR calc non Af Amer: >60 mL/min (ref >60)
GLUCOSE: 89 mg/dL (ref 65–99)
POTASSIUM: 3.5 mmol/L (ref 3.5–5.1)
Sodium: 139 mmol/L (ref 135–145)
Total Protein: 7 g/dL (ref 6.5–8.1)

## 2016-02-26 LAB — CBC WITH DIFFERENTIAL/PLATELET
BASOS PCT: 1 %
Basophils Absolute: 0.1 10*3/uL (ref 0–0.1)
Eosinophils Absolute: 0 10*3/uL (ref 0–0.7)
Eosinophils Relative: 1 %
HEMATOCRIT: 39.1 % — AB (ref 40.0–52.0)
HEMOGLOBIN: 13.3 g/dL (ref 13.0–18.0)
Lymphocytes Relative: 16 %
Lymphs Abs: 1.1 10*3/uL (ref 1.0–3.6)
MCH: 32.6 pg (ref 26.0–34.0)
MCHC: 34.1 g/dL (ref 32.0–36.0)
MCV: 95.7 fL (ref 80.0–100.0)
MONOS PCT: 13 %
Monocytes Absolute: 0.9 10*3/uL (ref 0.2–1.0)
NEUTROS ABS: 5 10*3/uL (ref 1.4–6.5)
NEUTROS PCT: 69 %
Platelets: 291 10*3/uL (ref 150–440)
RBC: 4.09 MIL/uL — ABNORMAL LOW (ref 4.40–5.90)
RDW: 15.6 % — ABNORMAL HIGH (ref 11.5–14.5)
WBC: 7.1 10*3/uL (ref 3.8–10.6)

## 2016-02-27 LAB — GLUCOSE, CAPILLARY: GLUCOSE-CAPILLARY: 87 mg/dL (ref 65–99)

## 2016-02-28 LAB — GLUCOSE, CAPILLARY: GLUCOSE-CAPILLARY: 106 mg/dL — AB (ref 65–99)

## 2016-02-29 LAB — GLUCOSE, CAPILLARY: GLUCOSE-CAPILLARY: 94 mg/dL (ref 65–99)

## 2016-03-01 LAB — GLUCOSE, CAPILLARY: Glucose-Capillary: 118 mg/dL — ABNORMAL HIGH (ref 65–99)

## 2016-03-02 LAB — GLUCOSE, CAPILLARY: GLUCOSE-CAPILLARY: 116 mg/dL — AB (ref 65–99)

## 2016-03-02 NOTE — Progress Notes (Signed)
Location:      Place of Service:  SNF (31) Provider:  Toni Arthurs, NP-C  Albina Billet, MD  Patient Care Team: Albina Billet, MD as PCP - General (Internal Medicine) Royston Cowper, MD as Consulting Physician (Urology)  Extended Emergency Contact Information Primary Emergency Contact: [place] Phone: [phone] Mobile Phone: [phone] Relation: Son Secondary Emergency Contact: Plaia,Clarice L Address: [address]          [place],  [address] [place] Phone: [phone] Relation: Spouse  Code Status:  dnr Goals of care: Advanced Directive information Advanced Directives 02/04/2016  Does patient have an advance directive? Yes  Type of Advance Directive -  Does patient want to make changes to advanced directive? -  Copy of advanced directive(s) in chart? No - copy requested     Chief Complaint  Patient presents with  . Follow-up    HPI:  Pt is a 82 y.o. male seen today for follow up visit for DVT of the LLE. Pt underwent and ORIF of Left Hip fracture. Pt has been doing well with therapy and not in any pain. Pt started having increased pain, swelling in the ankle. Pt was on Lovenox for DVT prophylaxis. Pt also has a h/o gout. Pt was treated for gout until radiologic survey results were available. Xrays negative; Doppler positive. Pt has been on treatment for 3 days. Reports pain, edema, redness is improved. No c/o n/v/d/f/c/cp/sob/ha/abd pain. No other complaints. VSS   Past Medical History:  Diagnosis Date  . Arthritis   . B12 deficiency   . Bladder cancer ([place])   . Dysrhythmia    Bradycardia  . Hip fracture, left ([place]) 02/04/2016  . Hypertension   . Non-insulin dependent type 2 diabetes mellitus ([place])   . Sleep apnea    Past Surgical History:  Procedure Laterality Date  . cyst on spine  1961  . EAR CYST EXCISION    . INTRAMEDULLARY (IM) NAIL INTERTROCHANTERIC Left 02/04/2016   Procedure:  INTRAMEDULLARY (IM) NAIL INTERTROCHANTRIC;  Surgeon: Corky Mull, MD;  Location: [facility] ORS;  Service: Orthopedics;  Laterality: Left;  . TRANSURETHRAL RESECTION OF BLADDER TUMOR N/A 10/24/2014   Procedure: TRANSURETHRAL RESECTION OF BLADDER TUMOR (TURBT);  Surgeon: Royston Cowper, MD;  Location: [facility] ORS;  Service: Urology;  Laterality: N/A;    No Known Allergies    Medication List       Accurate as of 02/22/16 11:59 PM. Always use your most recent med list.          allopurinol 100 MG tablet Commonly known as:  ZYLOPRIM Take 100 mg by mouth daily.   aspirin EC 81 MG tablet Take 81 mg by mouth daily.   bacitracin ointment Apply topically 2 (two) times daily.   BLADDER 2.2 Tabs Take 1 tablet by mouth daily.   diphenhydrAMINE 12.5 MG/5ML elixir Commonly known as:  BENADRYL Take 5-10 mLs (12.5-25 mg total) by mouth every 4 (four) hours as needed for itching.   docusate sodium 100 MG capsule Commonly known as:  COLACE Take 1 capsule (100 mg total) by mouth 2 (two) times daily.   enoxaparin 40 MG/0.4ML injection Commonly known as:  LOVENOX Inject 0.4 mLs (40 mg total) into the skin daily.   GLUCOSAMINE RELIEF 1000 MG Tabs Generic drug:  Glucosamine Sulfate Take 1,000 mg by mouth daily.   glyBURIDE 5 MG tablet Commonly known as:  DIABETA Take 5 mg by mouth daily with  breakfast.   lovastatin 20 MG tablet Commonly known as:  MEVACOR Take 20 mg by mouth daily at 6 PM.   magnesium hydroxide 400 MG/5ML suspension Commonly known as:  MILK OF MAGNESIA Take 30 mLs by mouth daily as needed for mild constipation.   metFORMIN 500 MG tablet Commonly known as:  GLUCOPHAGE Take 500 mg by mouth 2 (two) times daily with a meal.   mirabegron ER 25 MG Tb24 tablet Commonly known as:  MYRBETRIQ Take 1 tablet (25 mg total) by mouth daily.   oxyCODONE 5 MG immediate release tablet Commonly known as:  Oxy IR/ROXICODONE Take 1-2 tablets (5-10 mg total) by mouth every 3 (three) hours  as needed for breakthrough pain.   ramipril 2.5 MG capsule Commonly known as:  ALTACE Take 2.5 mg by mouth daily.   tamsulosin 0.4 MG Caps capsule Commonly known as:  FLOMAX Take 0.4 mg by mouth daily.   vitamin B-12 1000 MCG tablet Commonly known as:  CYANOCOBALAMIN Take 1,000 mcg by mouth daily.       Review of Systems  Constitutional: Negative for activity change, appetite change, chills, diaphoresis and fever.  HENT: Negative for congestion, sneezing, sore throat, trouble swallowing and voice change.   Respiratory: Negative for apnea, cough, choking, chest tightness, shortness of breath and wheezing.   Cardiovascular: Negative for chest pain, palpitations and leg swelling.  Gastrointestinal: Negative for abdominal distention, abdominal pain, constipation, diarrhea and nausea.  Genitourinary: Negative for difficulty urinating, dysuria, frequency and urgency.  Musculoskeletal: Positive for joint swelling (left ankle). Negative for back pain, gait problem and myalgias. Arthralgias: typical arthritis.  Skin: Positive for color change (left ankle- redness). Negative for pallor, rash and wound.  Neurological: Negative for dizziness, tremors, syncope, speech difficulty, weakness, numbness and headaches.  Psychiatric/Behavioral: Negative for agitation and behavioral problems.  All other systems reviewed and are negative.   Immunization History  Administered Date(s) Administered  . Tdap 02/04/2016   Pertinent  Health Maintenance Due  Topic Date Due  . HEMOGLOBIN A1C  08-27-1925  . FOOT EXAM  [birth year]1938  . OPHTHALMOLOGY EXAM  05/05/1936  . PNA vac Low Risk Adult (1 of 2 - PCV13) 05/05/1993  . INFLUENZA VACCINE  01/22/2016   Fall Risk  08/10/2015 03/09/2015  Falls in the past year? No No   Functional Status Survey:    Vitals:   02/21/16 0500  BP: (!) 101/49  Pulse: 68  Resp: 16  Temp: 98 F (36.7 C)  SpO2: 92%  Weight: 169 lb 4.8 oz (76.8 kg)   Body mass index is  22.96 kg/m. Physical Exam  Constitutional: He is oriented to person, place, and time. Vital signs are normal. He appears well-developed and well-nourished. He is active and cooperative. He does not appear ill. No distress.  HENT:  Head: Normocephalic and atraumatic.  Mouth/Throat: Uvula is midline, oropharynx is clear and moist and mucous membranes are normal. Mucous membranes are not pale, not dry and not cyanotic.  Eyes: Conjunctivae, EOM and lids are normal. Pupils are equal, round, and reactive to light.  Neck: Trachea normal, normal range of motion and full passive range of motion without pain. Neck supple. No JVD present. No tracheal deviation, no edema and no erythema present. No thyromegaly present.  Cardiovascular: Normal rate, regular rhythm, normal heart sounds, intact distal pulses and normal pulses.  Exam reveals no gallop, no distant heart sounds and no friction rub.   No murmur heard. Pulmonary/Chest: Effort normal and breath sounds normal. No  accessory muscle usage. No respiratory distress. He has no wheezes. He has no rales. He exhibits no tenderness.  Abdominal: Normal appearance and bowel sounds are normal. He exhibits no distension and no ascites. There is no tenderness.  Musculoskeletal: Normal range of motion. He exhibits no edema or tenderness.  Expected osteoarthritis, stiffness  Neurological: He is alert and oriented to person, place, and time. He has normal strength.  Skin: Skin is warm, dry and intact. Bruising and ecchymosis noted. He is not diaphoretic. No cyanosis. Erythema: left ankle- improved. No pallor. Nails show no clubbing.     Psychiatric: He has a normal mood and affect. His speech is normal and behavior is normal. Judgment and thought content normal. Cognition and memory are normal.  Nursing note and vitals reviewed.   Labs reviewed:  Recent Labs  02/06/16 0639 02/07/16 0536 02/26/16 0734  NA 138 137 139  K 3.7 4.2 3.5  CL 106 102 102  CO2 '27  29 30  '$ GLUCOSE 166* 196* 89  BUN 12 14 21*  CREATININE 0.88 0.94 0.81  CALCIUM 8.1* 8.4* 8.7*    Recent Labs  04/10/15 1452 11/27/15 1249 02/26/16 0734  AST '21 25 18  '$ ALT 14* 18 18  ALKPHOS 54 54 278*  BILITOT 0.5 0.5 0.9  PROT 7.0 7.1 7.0  ALBUMIN 3.5 3.9 3.5    Recent Labs  04/10/15 1452 11/27/15 1249  02/06/16 0639 02/07/16 0536 02/26/16 0734  WBC 6.7 6.2  < > 13.8* 15.8* 7.1  NEUTROABS 5.1 4.4  --   --   --  5.0  HGB 13.3 14.8  < > 10.4* 10.8* 13.3  HCT 39.7* 43.0  < > 30.3* 31.6* 39.1*  MCV 96.6 94.4  < > 94.0 95.3 95.7  PLT 181 167  < > 163 180 291  < > = values in this interval not displayed. No results found for: TSH No results found for: HGBA1C No results found for: CHOL, HDL, LDLCALC, LDLDIRECT, TRIG, CHOLHDL  Significant Diagnostic Results in last 30 days:  Dg Chest 1 View  Result Date: 02/04/2016 CLINICAL DATA:  Femur fracture today. Preoperative respiratory exam. EXAM: CHEST 1 VIEW COMPARISON:  10/05/2014 FINDINGS: Heart size is normal. Mediastinal shadows are normal. The lungs are clear. No effusions. No acute bony finding. IMPRESSION: No active disease. Electronically Signed   By: Nelson Chimes M.D.   On: 02/04/2016 14:43   Dg Knee 1-2 Views Left  Result Date: 02/04/2016 CLINICAL DATA:  Pain following fall EXAM: LEFT KNEE - 1-2 VIEW COMPARISON:  None. FINDINGS: Frontal and lateral views were obtained. There is no acute fracture or dislocation. No joint effusion. There is narrowing medially and in the patellofemoral joint region. Spurs arise from the anterior patella. No erosive change. IMPRESSION: Areas of osteoarthritic change.  No fracture or joint effusion. Electronically Signed   By: Lowella Grip III M.D.   On: 02/04/2016 14:06   Ct Head Wo Contrast  Result Date: 02/04/2016 CLINICAL DATA:  Fall today.  Head and neck injury and pain. EXAM: CT HEAD WITHOUT CONTRAST CT CERVICAL SPINE WITHOUT CONTRAST TECHNIQUE: Multidetector CT imaging of the head  and cervical spine was performed following the standard protocol without intravenous contrast. Multiplanar CT image reconstructions of the cervical spine were also generated. COMPARISON:  Head CT on 10/05/2014 FINDINGS: CT HEAD FINDINGS There is no evidence of intracranial hemorrhage, brain edema, or other signs of acute infarction. There is no evidence of intracranial mass lesion or mass effect. No  abnormal extraaxial fluid collections are identified. Moderate diffuse cerebral atrophy and mild chronic small vessel disease are stable. Ventricles are stable in size. No evidence of skull fracture or pneumocephalus. CT CERVICAL SPINE FINDINGS No evidence of acute fracture, subluxation, or prevertebral soft tissue swelling. Mild degenerative disc disease seen at levels of C5-6 and C6-7. Moderate to severe facet DJD seen mainly on the left from levels of C3 -C6. IMPRESSION: No acute intracranial abnormality. Stable moderate cerebral atrophy and mild chronic small vessel disease. No evidence of acute cervical spine fracture or subluxation. Degenerative spondylosis, as described above. Electronically Signed   By: Earle Gell M.D.   On: 02/04/2016 14:16   Ct Cervical Spine Wo Contrast  Result Date: 02/10/2016 CLINICAL DATA:  Fall today.  Head and neck injury and pain. EXAM: CT HEAD WITHOUT CONTRAST CT CERVICAL SPINE WITHOUT CONTRAST TECHNIQUE: Multidetector CT imaging of the head and cervical spine was performed following the standard protocol without intravenous contrast. Multiplanar CT image reconstructions of the cervical spine were also generated. COMPARISON:  Head CT on 10/05/2014 FINDINGS: CT HEAD FINDINGS There is no evidence of intracranial hemorrhage, brain edema, or other signs of acute infarction. There is no evidence of intracranial mass lesion or mass effect. No abnormal extraaxial fluid collections are identified. Moderate diffuse cerebral atrophy and mild chronic small vessel disease are stable.  Ventricles are stable in size. No evidence of skull fracture or pneumocephalus. CT CERVICAL SPINE FINDINGS No evidence of acute fracture, subluxation, or prevertebral soft tissue swelling. Mild degenerative disc disease seen at levels of C5-6 and C6-7. Moderate to severe facet DJD seen mainly on the left from levels of C3 -C6. IMPRESSION: No acute intracranial abnormality. Stable moderate cerebral atrophy and mild chronic small vessel disease. No evidence of acute cervical spine fracture or subluxation. Degenerative spondylosis, as described above. Electronically Signed   By: Earle Gell M.D.   On: 02/04/2016 14:16   Dg C-arm 1-60 Min  Result Date: 02/04/2016 CLINICAL DATA:  Comminuted subtrochanteric left hip fracture status post ORIF. EXAM: OPERATIVE left HIP (WITH PELVIS IF PERFORMED) 6 VIEWS TECHNIQUE: Fluoroscopic spot image(s) were submitted for interpretation post-operatively. COMPARISON:  Left hip radiographs from earlier in the same day. FINDINGS: Left I am rod is in place with a single distal interlocking screw. There is a dynamic femoral neck screw. Alignment is grossly anatomic. There is mild sub trochanteric displacement. IMPRESSION: 1. ORIF with intra medullary rod and dynamic femoral neck screw. 2. Mild persistent proximal displacement at the oblique subtrochanteric fracture. Electronically Signed   By: San Morelle M.D.   On: 02/04/2016 22:11   Dg Hip Operative Unilat W Or W/o Pelvis Left  Result Date: 02/04/2016 CLINICAL DATA:  Comminuted subtrochanteric left hip fracture status post ORIF. EXAM: OPERATIVE left HIP (WITH PELVIS IF PERFORMED) 6 VIEWS TECHNIQUE: Fluoroscopic spot image(s) were submitted for interpretation post-operatively. COMPARISON:  Left hip radiographs from earlier in the same day. FINDINGS: Left I am rod is in place with a single distal interlocking screw. There is a dynamic femoral neck screw. Alignment is grossly anatomic. There is mild sub trochanteric  displacement. IMPRESSION: 1. ORIF with intra medullary rod and dynamic femoral neck screw. 2. Mild persistent proximal displacement at the oblique subtrochanteric fracture. Electronically Signed   By: San Morelle M.D.   On: 02/04/2016 22:11   Dg Hip Unilat With Pelvis 2-3 Views Left  Result Date: 02/04/2016 CLINICAL DATA:  Pain following fall EXAM: DG HIP (WITH OR WITHOUT PELVIS) 2-3V LEFT  COMPARISON:  None. FINDINGS: Frontal pelvis well as frontal and lateral left hip images were obtained. There is a fracture of the proximal left femoral diaphysis, primarily inferior to the intertrochanteric region but with avulsion of the lesser trochanter. There is medial displacement of distal major fracture fragments with respect to the major proximal fragment. No other areas of fracture. No dislocation. There is mild symmetric narrowing of both hip joints. No erosive change. IMPRESSION: Comminuted fracture proximal left femoral diaphysis with displaced major fracture fragments. There is avulsion of the lesser trochanter on the left. No other fractures. No dislocation. Mild symmetric narrowing both hip joints. Electronically Signed   By: Lowella Grip III M.D.   On: 02/04/2016 14:05    Assessment/Plan 1. Acute deep vein thrombosis (DVT) of left lower extremity, unspecified vein (HCC)  Conitnue Xarelto 15 mg po BID x 21 days  Then change to Xarelto 20 mg po Q day  Pt to follow up with cardiologist for management beyond discharge  Heat (heating pad, k-pad) to LLE prn for pain  Tylenol and/or tramadol prn for pain  Monitor labs for thrombocytopenia, etc  Family/ staff Communication:   Total Time:  Documentation:  Face to Face:  Family/Phone:   Labs/tests ordered: CBC, Met C 9/5  Medication list reviewed and assessed for continued appropriateness.  Vikki Ports, NP-C Geriatrics [facility] [phone], [place] [address] Cell Phone  (Mon-Fri 8am-5pm):  [phone] On Call:  [phone] & follow prompts after 5pm & weekends Office Phone:  [phone] Office Fax:  [phone]

## 2016-03-03 LAB — GLUCOSE, CAPILLARY: GLUCOSE-CAPILLARY: 128 mg/dL — AB (ref 65–99)

## 2016-03-04 ENCOUNTER — Non-Acute Institutional Stay (SKILLED_NURSING_FACILITY): Payer: Medicare Other | Admitting: Gerontology

## 2016-03-04 DIAGNOSIS — S72002D Fracture of unspecified part of neck of left femur, subsequent encounter for closed fracture with routine healing: Secondary | ICD-10-CM | POA: Diagnosis not present

## 2016-03-04 DIAGNOSIS — I82402 Acute embolism and thrombosis of unspecified deep veins of left lower extremity: Secondary | ICD-10-CM

## 2016-03-04 LAB — GLUCOSE, CAPILLARY: Glucose-Capillary: 107 mg/dL — ABNORMAL HIGH (ref 65–99)

## 2016-03-05 LAB — GLUCOSE, CAPILLARY: GLUCOSE-CAPILLARY: 100 mg/dL — AB (ref 65–99)

## 2016-03-06 LAB — GLUCOSE, CAPILLARY: Glucose-Capillary: 136 mg/dL — ABNORMAL HIGH (ref 65–99)

## 2016-03-07 LAB — GLUCOSE, CAPILLARY
Glucose-Capillary: 137 mg/dL — ABNORMAL HIGH (ref 65–99)
Glucose-Capillary: 133 mg/dL — ABNORMAL HIGH (ref 65–99)

## 2016-03-08 NOTE — Progress Notes (Signed)
Location:      Place of Service:  SNF (31) Provider:  Toni Arthurs, NP-C  Albina Billet, MD  Patient Care Team: Albina Billet, MD as PCP - General (Internal Medicine) Royston Cowper, MD as Consulting Physician (Urology)  Extended Emergency Contact Information Primary Emergency Contact: [place] Phone: [phone] Mobile Phone: [phone] Relation: Son Secondary Emergency Contact: Saksa,Clarice L Address: [address]          [place], [place] [address] [place] Phone: [phone] Relation: Spouse  Code Status:  dnr Goals of care: Advanced Directive information Advanced Directives 02/04/2016  Does patient have an advance directive? Yes  Type of Advance Directive -  Does patient want to make changes to advanced directive? -  Copy of advanced directive(s) in chart? No - copy requested     Chief Complaint  Patient presents with  . Follow-up    HPI:  Pt is a 82 y.o. male seen today for follow up visit for DVT of the LLE. Pt underwent and ORIF of Left Hip fracture. Pt has been doing well with therapy and not in any pain. Pt started having increased pain, swelling in the ankle. Pt was on Lovenox for DVT prophylaxis. Xrays negative; Doppler positive. Pt has been on treatment for 12 days. Reports pain, edema, redness is improved. No c/o n/v/d/f/c/cp/sob/ha/abd pain. No other complaints. VSS   Past Medical History:  Diagnosis Date  . Arthritis   . B12 deficiency   . Bladder cancer ([place])   . Dysrhythmia    Bradycardia  . Hip fracture, left ([place]) 02/04/2016  . Hypertension   . Non-insulin dependent type 2 diabetes mellitus ([place])   . Sleep apnea    Past Surgical History:  Procedure Laterality Date  . cyst on spine  1961  . EAR CYST EXCISION    . INTRAMEDULLARY (IM) NAIL INTERTROCHANTERIC Left 02/04/2016   Procedure: INTRAMEDULLARY (IM) NAIL INTERTROCHANTRIC;  Surgeon: Corky Mull, MD;  Location: [facility] ORS;  Service:  Orthopedics;  Laterality: Left;  . TRANSURETHRAL RESECTION OF BLADDER TUMOR N/A 10/24/2014   Procedure: TRANSURETHRAL RESECTION OF BLADDER TUMOR (TURBT);  Surgeon: Royston Cowper, MD;  Location: [facility] ORS;  Service: Urology;  Laterality: N/A;    No Known Allergies    Medication List       Accurate as of 03/04/16 11:59 PM. Always use your most recent med list.          allopurinol 100 MG tablet Commonly known as:  ZYLOPRIM Take 100 mg by mouth daily.   aspirin EC 81 MG tablet Take 81 mg by mouth daily.   bacitracin ointment Apply topically 2 (two) times daily.   BLADDER 2.2 Tabs Take 1 tablet by mouth daily.   diphenhydrAMINE 12.5 MG/5ML elixir Commonly known as:  BENADRYL Take 5-10 mLs (12.5-25 mg total) by mouth every 4 (four) hours as needed for itching.   docusate sodium 100 MG capsule Commonly known as:  COLACE Take 1 capsule (100 mg total) by mouth 2 (two) times daily.   enoxaparin 40 MG/0.4ML injection Commonly known as:  LOVENOX Inject 0.4 mLs (40 mg total) into the skin daily.   GLUCOSAMINE RELIEF 1000 MG Tabs Generic drug:  Glucosamine Sulfate Take 1,000 mg by mouth daily.   glyBURIDE 5 MG tablet Commonly known as:  DIABETA Take 5 mg by mouth daily with breakfast.   lovastatin 20 MG tablet Commonly known as:  MEVACOR Take 20 mg by mouth  daily at 6 PM.   magnesium hydroxide 400 MG/5ML suspension Commonly known as:  MILK OF MAGNESIA Take 30 mLs by mouth daily as needed for mild constipation.   metFORMIN 500 MG tablet Commonly known as:  GLUCOPHAGE Take 500 mg by mouth 2 (two) times daily with a meal.   mirabegron ER 25 MG Tb24 tablet Commonly known as:  MYRBETRIQ Take 1 tablet (25 mg total) by mouth daily.   oxyCODONE 5 MG immediate release tablet Commonly known as:  Oxy IR/ROXICODONE Take 1-2 tablets (5-10 mg total) by mouth every 3 (three) hours as needed for breakthrough pain.   ramipril 2.5 MG capsule Commonly known as:  ALTACE Take 2.5  mg by mouth daily.   tamsulosin 0.4 MG Caps capsule Commonly known as:  FLOMAX Take 0.4 mg by mouth daily.   vitamin B-12 1000 MCG tablet Commonly known as:  CYANOCOBALAMIN Take 1,000 mcg by mouth daily.       Review of Systems  Constitutional: Negative for activity change, appetite change, chills, diaphoresis and fever.  HENT: Negative for congestion, sneezing, sore throat, trouble swallowing and voice change.   Respiratory: Negative for apnea, cough, choking, chest tightness, shortness of breath and wheezing.   Cardiovascular: Negative for chest pain, palpitations and leg swelling.  Gastrointestinal: Negative for abdominal distention, abdominal pain, constipation, diarrhea and nausea.  Genitourinary: Negative for difficulty urinating, dysuria, frequency and urgency.  Musculoskeletal: Positive for joint swelling (left ankle). Negative for back pain, gait problem and myalgias. Arthralgias: typical arthritis.  Skin: Positive for color change (left ankle- redness). Negative for pallor, rash and wound.  Neurological: Negative for dizziness, tremors, syncope, speech difficulty, weakness, numbness and headaches.  Psychiatric/Behavioral: Negative for agitation and behavioral problems.  All other systems reviewed and are negative.   Immunization History  Administered Date(s) Administered  . Tdap 02/04/2016   Pertinent  Health Maintenance Due  Topic Date Due  . HEMOGLOBIN A1C  04/02/28  . FOOT EXAM  05/05/1936  . OPHTHALMOLOGY EXAM  05/05/1936  . PNA vac Low Risk Adult (1 of 2 - PCV13) 05/05/1993  . INFLUENZA VACCINE  01/22/2016   Fall Risk  08/10/2015 03/09/2015  Falls in the past year? No No   Functional Status Survey:    There were no vitals filed for this visit. There is no height or weight on file to calculate BMI. Physical Exam  Constitutional: He is oriented to person, place, and time. Vital signs are normal. He appears well-developed and well-nourished. He is active  and cooperative. He does not appear ill. No distress.  HENT:  Head: Normocephalic and atraumatic.  Mouth/Throat: Uvula is midline, oropharynx is clear and moist and mucous membranes are normal. Mucous membranes are not pale, not dry and not cyanotic.  Eyes: Conjunctivae, EOM and lids are normal. Pupils are equal, round, and reactive to light.  Neck: Trachea normal, normal range of motion and full passive range of motion without pain. Neck supple. No JVD present. No tracheal deviation, no edema and no erythema present. No thyromegaly present.  Cardiovascular: Normal rate, regular rhythm, normal heart sounds, intact distal pulses and normal pulses.  Exam reveals no gallop, no distant heart sounds and no friction rub.   No murmur heard. Pulmonary/Chest: Effort normal and breath sounds normal. No accessory muscle usage. No respiratory distress. He has no wheezes. He has no rales. He exhibits no tenderness.  Abdominal: Normal appearance and bowel sounds are normal. He exhibits no distension and no ascites. There is no tenderness.  Musculoskeletal: Normal range of motion. He exhibits no edema or tenderness.  Expected osteoarthritis, stiffness  Neurological: He is alert and oriented to person, place, and time. He has normal strength.  Skin: Skin is warm, dry and intact. Bruising and ecchymosis noted. He is not diaphoretic. No cyanosis. Erythema: left ankle- improved. No pallor. Nails show no clubbing.     Psychiatric: He has a normal mood and affect. His speech is normal and behavior is normal. Judgment and thought content normal. Cognition and memory are normal.  Nursing note and vitals reviewed.   Labs reviewed:  Recent Labs  02/06/16 0639 02/07/16 0536 02/26/16 0734  NA 138 137 139  K 3.7 4.2 3.5  CL 106 102 102  CO2 27 29 30   GLUCOSE 166* 196* 89  BUN 12 14 21*  CREATININE 0.88 0.94 0.81  CALCIUM 8.1* 8.4* 8.7*    Recent Labs  04/10/15 1452 11/27/15 1249 02/26/16 0734  AST 21  25 18   ALT 14* 18 18  ALKPHOS 54 54 278*  BILITOT 0.5 0.5 0.9  PROT 7.0 7.1 7.0  ALBUMIN 3.5 3.9 3.5    Recent Labs  04/10/15 1452 11/27/15 1249  02/06/16 0639 02/07/16 0536 02/26/16 0734  WBC 6.7 6.2  < > 13.8* 15.8* 7.1  NEUTROABS 5.1 4.4  --   --   --  5.0  HGB 13.3 14.8  < > 10.4* 10.8* 13.3  HCT 39.7* 43.0  < > 30.3* 31.6* 39.1*  MCV 96.6 94.4  < > 94.0 95.3 95.7  PLT 181 167  < > 163 180 291  < > = values in this interval not displayed. No results found for: TSH No results found for: HGBA1C No results found for: CHOL, HDL, LDLCALC, LDLDIRECT, TRIG, CHOLHDL  Significant Diagnostic Results in last 30 days:  No results found.  Assessment/Plan 1. Acute deep vein thrombosis (DVT) of left lower extremity, unspecified vein (HCC)  Conitnue Xarelto 15 mg po BID x 21 days  Then change to Xarelto 20 mg po Q day  Pt to follow up with cardiologist for management beyond discharge  Heat (heating pad, k-pad) to LLE prn for pain  Tylenol and/or tramadol prn for pain  Monitor labs for thrombocytopenia, etc  2. Closed left hip fracture, with routine healing, subsequent encounter  Continue physical therapy  Tylenol for pain control as patient is reporting very minimal pain  Ice pack to left hip when necessary pain, swelling  Maintain hip precautions  Family/ staff Communication:   Total Time:  Documentation:  Face to Face:  Family/Phone:   Labs/tests ordered:   Medication list reviewed and assessed for continued appropriateness.  Vikki Ports, NP-C Geriatrics [facility] [phone], [place] [address] Cell Phone (Mon-Fri 8am-5pm):  [phone] On Call:  [phone] & follow prompts after 5pm & weekends Office Phone:  [phone] Office Fax:  [phone]

## 2016-04-09 ENCOUNTER — Ambulatory Visit (INDEPENDENT_AMBULATORY_CARE_PROVIDER_SITE_OTHER): Payer: Medicare Other | Admitting: Podiatry

## 2016-04-09 ENCOUNTER — Encounter: Payer: Self-pay | Admitting: Podiatry

## 2016-04-09 DIAGNOSIS — M79676 Pain in unspecified toe(s): Secondary | ICD-10-CM | POA: Diagnosis not present

## 2016-04-09 DIAGNOSIS — B351 Tinea unguium: Secondary | ICD-10-CM

## 2016-04-09 DIAGNOSIS — Q828 Other specified congenital malformations of skin: Secondary | ICD-10-CM | POA: Diagnosis not present

## 2016-04-10 NOTE — Progress Notes (Signed)
He presents today with chief complaint of painful elongated toenails. He also has tenderness to the fourth digit of the right foot distally. States that he has fallen since I saw him last breaking his hip which resulted in surgery. He presents with a walker today.  Objective: Vital signs are stable he's alert and oriented 3 pulse remains palpable bilateral. Hammertoe deformities bilateral resulting reactive hyperkeratosis distal aspect of the right foot with some light beneath the lesion. This does not appear to be open. Toenails are thick yellow dystrophic with mycotic and painful on palpation.  Assessment: Pain limb secondary to onychomycosis. Pain in limb secondary to reactive hyperkeratotic lesion distal aspect fourth digit right foot.  Plan: Debrided all reactive hyperkeratosis today and debridement nails 1 through 5 bilateral.

## 2016-04-16 ENCOUNTER — Ambulatory Visit: Payer: Medicare Other | Admitting: Podiatry

## 2016-05-12 ENCOUNTER — Encounter: Payer: Self-pay | Admitting: Podiatry

## 2016-05-12 ENCOUNTER — Ambulatory Visit (INDEPENDENT_AMBULATORY_CARE_PROVIDER_SITE_OTHER): Payer: Medicare Other | Admitting: Podiatry

## 2016-05-12 DIAGNOSIS — L97511 Non-pressure chronic ulcer of other part of right foot limited to breakdown of skin: Secondary | ICD-10-CM | POA: Diagnosis not present

## 2016-05-12 NOTE — Progress Notes (Signed)
He presents today for follow-up of his fourth toe on the right foot. He just wanted me to check it again because he thinks that it may not be healing as well as it should.  Objective: All signs are stable he is alert and oriented 3 much improved cellulitis no open lesion at this point. No signs of drainage or purulence. Mild superficial ulceration plantar lateral aspect fourth digit right foot with adductor varus rotated hammertoe.  Assessment: Ulceration fourth digit right.  Plan: Debrided the lesion today placed padding and a dressing encouraged him to continue to dress regularly follow up with me in a month for his regular routine visit.

## 2016-05-30 ENCOUNTER — Inpatient Hospital Stay: Payer: Medicare Other | Attending: Internal Medicine

## 2016-05-30 ENCOUNTER — Inpatient Hospital Stay (HOSPITAL_BASED_OUTPATIENT_CLINIC_OR_DEPARTMENT_OTHER): Payer: Medicare Other | Admitting: Internal Medicine

## 2016-05-30 DIAGNOSIS — Z7982 Long term (current) use of aspirin: Secondary | ICD-10-CM | POA: Diagnosis not present

## 2016-05-30 DIAGNOSIS — E119 Type 2 diabetes mellitus without complications: Secondary | ICD-10-CM

## 2016-05-30 DIAGNOSIS — M129 Arthropathy, unspecified: Secondary | ICD-10-CM

## 2016-05-30 DIAGNOSIS — I10 Essential (primary) hypertension: Secondary | ICD-10-CM | POA: Diagnosis not present

## 2016-05-30 DIAGNOSIS — I499 Cardiac arrhythmia, unspecified: Secondary | ICD-10-CM | POA: Insufficient documentation

## 2016-05-30 DIAGNOSIS — Z7984 Long term (current) use of oral hypoglycemic drugs: Secondary | ICD-10-CM

## 2016-05-30 DIAGNOSIS — Z87891 Personal history of nicotine dependence: Secondary | ICD-10-CM

## 2016-05-30 DIAGNOSIS — R748 Abnormal levels of other serum enzymes: Secondary | ICD-10-CM | POA: Diagnosis not present

## 2016-05-30 DIAGNOSIS — R000 Tachycardia, unspecified: Secondary | ICD-10-CM | POA: Diagnosis not present

## 2016-05-30 DIAGNOSIS — C678 Malignant neoplasm of overlapping sites of bladder: Secondary | ICD-10-CM | POA: Insufficient documentation

## 2016-05-30 DIAGNOSIS — Z79899 Other long term (current) drug therapy: Secondary | ICD-10-CM

## 2016-05-30 DIAGNOSIS — Z923 Personal history of irradiation: Secondary | ICD-10-CM | POA: Insufficient documentation

## 2016-05-30 DIAGNOSIS — C679 Malignant neoplasm of bladder, unspecified: Secondary | ICD-10-CM

## 2016-05-30 DIAGNOSIS — G4730 Sleep apnea, unspecified: Secondary | ICD-10-CM

## 2016-05-30 DIAGNOSIS — E538 Deficiency of other specified B group vitamins: Secondary | ICD-10-CM

## 2016-05-30 DIAGNOSIS — Z9221 Personal history of antineoplastic chemotherapy: Secondary | ICD-10-CM

## 2016-05-30 DIAGNOSIS — Z8781 Personal history of (healed) traumatic fracture: Secondary | ICD-10-CM | POA: Insufficient documentation

## 2016-05-30 LAB — CBC WITH DIFFERENTIAL/PLATELET
Basophils Absolute: 0.1 10*3/uL (ref 0–0.1)
Basophils Relative: 1 %
EOS ABS: 0.2 10*3/uL (ref 0–0.7)
Eosinophils Relative: 3 %
HEMATOCRIT: 42.5 % (ref 40.0–52.0)
HEMOGLOBIN: 14.2 g/dL (ref 13.0–18.0)
LYMPHS ABS: 1.5 10*3/uL (ref 1.0–3.6)
LYMPHS PCT: 18 %
MCH: 30 pg (ref 26.0–34.0)
MCHC: 33.4 g/dL (ref 32.0–36.0)
MCV: 89.9 fL (ref 80.0–100.0)
MONOS PCT: 11 %
Monocytes Absolute: 0.9 10*3/uL (ref 0.2–1.0)
NEUTROS ABS: 5.8 10*3/uL (ref 1.4–6.5)
NEUTROS PCT: 67 %
Platelets: 190 10*3/uL (ref 150–440)
RBC: 4.72 MIL/uL (ref 4.40–5.90)
RDW: 15.3 % — ABNORMAL HIGH (ref 11.5–14.5)
WBC: 8.6 10*3/uL (ref 3.8–10.6)

## 2016-05-30 LAB — COMPREHENSIVE METABOLIC PANEL
ALBUMIN: 3.6 g/dL (ref 3.5–5.0)
ALK PHOS: 141 U/L — AB (ref 38–126)
ALT: 15 U/L — AB (ref 17–63)
AST: 20 U/L (ref 15–41)
Anion gap: 6 (ref 5–15)
BILIRUBIN TOTAL: 0.4 mg/dL (ref 0.3–1.2)
BUN: 19 mg/dL (ref 6–20)
CALCIUM: 8.9 mg/dL (ref 8.9–10.3)
CO2: 28 mmol/L (ref 22–32)
CREATININE: 0.95 mg/dL (ref 0.61–1.24)
Chloride: 102 mmol/L (ref 101–111)
GFR calc non Af Amer: >60 mL/min (ref >60)
GLUCOSE: 191 mg/dL — AB (ref 65–99)
Potassium: 4.6 mmol/L (ref 3.5–5.1)
SODIUM: 136 mmol/L (ref 135–145)
TOTAL PROTEIN: 7.3 g/dL (ref 6.5–8.1)

## 2016-05-30 NOTE — Progress Notes (Signed)
Patient is here for follow up  

## 2016-05-30 NOTE — Progress Notes (Signed)
[place] OFFICE PROGRESS NOTE  Patient Care Team: Albina Billet, MD as PCP - General (Internal Medicine) Royston Cowper, MD as Consulting Physician (Urology)  Bladder cancer [facility])   Staging form: Urinary Bladder, AJCC 7th Edition   - Clinical: Stage II (T2, N0, M0) - Signed by Forest Gleason, MD on 11/15/2014   Oncology History   1 7 had hematuria followed by cystoscopy on May 3, 2016the scan and cystoscopy indicated 2 x 2 centimeter papillary bladder tumor on the right side of the posterior wall biopsy and urine cytology was positive for transitional cell carcinoma.  Tumor was muscle invasion with lymphovascular invasion Patient  had a transurethral resection of bladder tumor (Oct 24, 2014) 3.patient will start cis-platinum and radiation therapy from June of 2016 4.  Patient has finished total 6 cycles of chemotherapy with cis-platinum on July s/p RT finished on August 4 of 2016.  # last cystoscopy- Dr.Wolfe/Urology- Nov 2017- NED as per pt.        Bladder cancer ([place])   10/24/2014 Initial Diagnosis    Bladder cancer       Cancer of overlapping sites of bladder [facility])     This is my first interaction with the patient as patient's primary oncologist has been Dr.Choksi. I reviewed the patient's prior charts/pertinent labs/imaging in detail; findings are summarized above.    INTERVAL HISTORY:  Herbert Phillips 82 y.o.  male pleasant patient above history of Stage II bladder cancer underwent bladder sparing chemoradiation is here for follow-up. Patient had a cystoscopy 3-4 weeks prior as the patient negative.  Patient denies any blood in urine. Denies any dysuria. Denies abdominal pain. Denies any shortness of breath or chest pain or cough. Denies any bone pain. He is fairly active for his age.  REVIEW OF SYSTEMS:  A complete 10 point review of system is done which is negative except mentioned above/history of present illness.   PAST MEDICAL HISTORY :  Past  Medical History:  Diagnosis Date  . Arthritis   . B12 deficiency   . Bladder cancer ([place])   . Dysrhythmia    Bradycardia  . Hip fracture, left ([place]) 02/04/2016  . Hypertension   . Non-insulin dependent type 2 diabetes mellitus ([place])   . Sleep apnea     PAST SURGICAL HISTORY :   Past Surgical History:  Procedure Laterality Date  . cyst on spine  1961  . EAR CYST EXCISION    . INTRAMEDULLARY (IM) NAIL INTERTROCHANTERIC Left 02/04/2016   Procedure: INTRAMEDULLARY (IM) NAIL INTERTROCHANTRIC;  Surgeon: Corky Mull, MD;  Location: [facility] ORS;  Service: Orthopedics;  Laterality: Left;  . TRANSURETHRAL RESECTION OF BLADDER TUMOR N/A 10/24/2014   Procedure: TRANSURETHRAL RESECTION OF BLADDER TUMOR (TURBT);  Surgeon: Royston Cowper, MD;  Location: [facility] ORS;  Service: Urology;  Laterality: N/A;    FAMILY HISTORY :   Family History  Problem Relation Age of Onset  . Diabetes Neg Hx     SOCIAL HISTORY:   Social History  Substance Use Topics  . Smoking status: Former Smoker    Quit date: 06/13/1976  . Smokeless tobacco: Never Used  . Alcohol use Yes     Comment: monthly 3 beers since xmas    ALLERGIES:  has No Known Allergies.  MEDICATIONS:  Current Outpatient Prescriptions  Medication Sig Dispense Refill  . allopurinol (ZYLOPRIM) 100 MG tablet Take 100 mg by mouth daily.     Marland Kitchen aspirin EC 81 MG tablet  Take 81 mg by mouth daily.     . Cholecalciferol (VITAMIN D PO) Take by mouth.    . docusate sodium (COLACE) 100 MG capsule Take 1 capsule (100 mg total) by mouth 2 (two) times daily. 10 capsule 0  . furosemide (LASIX) 20 MG tablet Take 20 mg by mouth as needed.     . Glucosamine Sulfate (GLUCOSAMINE RELIEF) 1000 MG TABS Take 1,000 mg by mouth daily.     Marland Kitchen glyBURIDE (DIABETA) 5 MG tablet Take 5 mg by mouth daily with breakfast.    . lovastatin (MEVACOR) 20 MG tablet Take 20 mg by mouth daily at 6 PM.     . metFORMIN (GLUCOPHAGE) 500 MG tablet Take 500 mg by mouth 2 (two) times daily  with a meal.     . Nutritional Supplements (BLADDER 2.2) TABS Take 1 tablet by mouth daily.     . ramipril (ALTACE) 2.5 MG capsule Take 2.5 mg by mouth daily.    . vitamin B-12 (CYANOCOBALAMIN) 1000 MCG tablet Take 1,000 mcg by mouth daily.    . bacitracin ointment Apply topically 2 (two) times daily. (Patient not taking: Reported on 05/30/2016) 120 g 0  . diphenhydrAMINE (BENADRYL) 12.5 MG/5ML elixir Take 5-10 mLs (12.5-25 mg total) by mouth every 4 (four) hours as needed for itching. (Patient not taking: Reported on 05/30/2016) 120 mL 0  . enoxaparin (LOVENOX) 40 MG/0.4ML injection Inject 0.4 mLs (40 mg total) into the skin daily. (Patient not taking: Reported on 05/30/2016) 14 Syringe 0  . magnesium hydroxide (MILK OF MAGNESIA) 400 MG/5ML suspension Take 30 mLs by mouth daily as needed for mild constipation. (Patient not taking: Reported on 05/30/2016) 360 mL 0  . mirabegron ER (MYRBETRIQ) 25 MG TB24 tablet Take 1 tablet (25 mg total) by mouth daily. (Patient not taking: Reported on 05/30/2016) 30 tablet 5   No current facility-administered medications for this visit.     PHYSICAL EXAMINATION: ECOG PERFORMANCE STATUS: 0 - Asymptomatic  BP 115/63 (BP Location: Left Arm, Patient Position: Sitting)   Pulse 64   Temp 97 F (36.1 C) (Tympanic)   Resp 18   Wt 179 lb 3.2 oz (81.3 kg)   BMI 24.30 kg/m   Filed Weights   05/30/16 1437  Weight: 179 lb 3.2 oz (81.3 kg)    GENERAL: Well-nourished well-developed; Alert, no distress and comfortable.   Accompanied by his son. His walking by himself. EYES: no pallor or icterus OROPHARYNX: no thrush or ulceration; good dentition  NECK: supple, no masses felt LYMPH:  no palpable lymphadenopathy in the cervical, axillary or inguinal regions LUNGS: clear to auscultation and  No wheeze or crackles HEART/CVS: regular rate & rhythm and no murmurs; No lower extremity edema ABDOMEN:abdomen soft, non-tender and normal bowel sounds Musculoskeletal:no  cyanosis of digits and no clubbing  PSYCH: alert & oriented x 3 with fluent speech NEURO: no focal motor/sensory deficits SKIN:  no rashes or significant lesions  LABORATORY DATA:  I have reviewed the data as listed    Component Value Date/Time   NA 136 05/30/2016 1402   NA 141 10/06/2014 0451   K 4.6 05/30/2016 1402   K 3.6 10/06/2014 0451   CL 102 05/30/2016 1402   CL 109 10/06/2014 0451   CO2 28 05/30/2016 1402   CO2 28 10/06/2014 0451   GLUCOSE 191 (H) 05/30/2016 1402   GLUCOSE 147 (H) 10/06/2014 0451   BUN 19 05/30/2016 1402   BUN 25 (H) 10/06/2014 0451   CREATININE  0.95 05/30/2016 1402   CREATININE 0.95 10/06/2014 0451   CALCIUM 8.9 05/30/2016 1402   CALCIUM 8.4 (L) 10/06/2014 0451   PROT 7.3 05/30/2016 1402   PROT 7.7 10/05/2014 1526   ALBUMIN 3.6 05/30/2016 1402   ALBUMIN 3.7 10/05/2014 1526   AST 20 05/30/2016 1402   AST 19 10/05/2014 1526   ALT 15 (L) 05/30/2016 1402   ALT 13 (L) 10/05/2014 1526   ALKPHOS 141 (H) 05/30/2016 1402   ALKPHOS 51 10/05/2014 1526   BILITOT 0.4 05/30/2016 1402   BILITOT 0.7 10/05/2014 1526   GFRNONAA >60 05/30/2016 1402   GFRNONAA >60 10/06/2014 0451   GFRAA >60 05/30/2016 1402   GFRAA >60 10/06/2014 0451    No results found for: SPEP, UPEP  Lab Results  Component Value Date   WBC 8.6 05/30/2016   NEUTROABS 5.8 05/30/2016   HGB 14.2 05/30/2016   HCT 42.5 05/30/2016   MCV 89.9 05/30/2016   PLT 190 05/30/2016      Chemistry      Component Value Date/Time   NA 136 05/30/2016 1402   NA 141 10/06/2014 0451   K 4.6 05/30/2016 1402   K 3.6 10/06/2014 0451   CL 102 05/30/2016 1402   CL 109 10/06/2014 0451   CO2 28 05/30/2016 1402   CO2 28 10/06/2014 0451   BUN 19 05/30/2016 1402   BUN 25 (H) 10/06/2014 0451   CREATININE 0.95 05/30/2016 1402   CREATININE 0.95 10/06/2014 0451      Component Value Date/Time   CALCIUM 8.9 05/30/2016 1402   CALCIUM 8.4 (L) 10/06/2014 0451   ALKPHOS 141 (H) 05/30/2016 1402   ALKPHOS 51  10/05/2014 1526   AST 20 05/30/2016 1402   AST 19 10/05/2014 1526   ALT 15 (L) 05/30/2016 1402   ALT 13 (L) 10/05/2014 1526   BILITOT 0.4 05/30/2016 1402   BILITOT 0.7 10/05/2014 1526     IMPRESSION:  2.7 cm mural soft tissue mass along the right posterior wall of the  urinary bladder, consistent with transitional cell carcinoma.   No evidence of metastatic disease or upper urinary tract neoplasm.   Electronically Signed    By: Earle Gell M.D.    On: 09/28/2014 15:41    RADIOGRAPHIC STUDIES: I have personally reviewed the radiological images as listed and agreed with the findings in the report. No results found.   ASSESSMENT & PLAN:  Cancer of overlapping sites of bladder [facility]) Bladder cancer stage- stage II; s/p chemo-RT last cystoscopy- [mid Nov 2017; dr.wolfe]; repeat in 6 months; negative as per pt.  # elevated alkaline phos- improving  # follow up in 6 months/labs/ recommend CT AP scan prior to next visit. [will inform pt].    Orders Placed This Encounter  Procedures  . CT ABDOMEN PELVIS W CONTRAST    Standing Status:   Future    Standing Expiration Date:   08/29/2017    Order Specific Question:   Reason for Exam (SYMPTOM  OR DIAGNOSIS REQUIRED)    Answer:   bladder cancer    Order Specific Question:   Preferred imaging location?    Answer:   [facility]  . CBC with Differential    Standing Status:   Future    Standing Expiration Date:   05/30/2017  . Comprehensive metabolic panel    Standing Status:   Future    Standing Expiration Date:   05/30/2017   All questions were answered. The patient knows to call the clinic  with any problems, questions or concerns.      Cammie Sickle, MD 05/30/2016 4:18 PM

## 2016-05-30 NOTE — Assessment & Plan Note (Addendum)
Bladder cancer stage- stage II; s/p chemo-RT last cystoscopy- [mid Nov 2017; dr.wolfe]; repeat in 6 months; negative as per pt.  # elevated alkaline phos- improving  # follow up in 6 months/labs/ recommend CT AP scan prior to next visit. [will inform pt].

## 2016-06-09 ENCOUNTER — Ambulatory Visit (INDEPENDENT_AMBULATORY_CARE_PROVIDER_SITE_OTHER): Payer: Medicare Other | Admitting: Podiatry

## 2016-06-09 ENCOUNTER — Encounter: Payer: Self-pay | Admitting: Podiatry

## 2016-06-09 DIAGNOSIS — R269 Unspecified abnormalities of gait and mobility: Secondary | ICD-10-CM

## 2016-06-09 DIAGNOSIS — R2689 Other abnormalities of gait and mobility: Secondary | ICD-10-CM

## 2016-06-09 DIAGNOSIS — L97511 Non-pressure chronic ulcer of other part of right foot limited to breakdown of skin: Secondary | ICD-10-CM | POA: Diagnosis not present

## 2016-06-09 NOTE — Progress Notes (Signed)
Presents today for a follow-up of ulceration fourth digit right foot and second digit left foot. He states that his nails are getting long as well. Relates that he has had multiple falls.  Objective: Vital signs are stable he's alert and oriented 3. Ulceration is going to heal quite nicely once debrided today demonstrates no signs of infection no erythema purulence drainage or odor some mild bleeding. To both sites appear to be healing. He does have a very unstable gait.  Assessment: Well-healing ulcerations fourth digit right second digit left.  Plan: Follow up with him in 1 month for nail debridement and reevaluation of his ulcers. Motor prescription for him to set up physical therapy for gait training ends straining.

## 2016-07-14 ENCOUNTER — Ambulatory Visit: Payer: Medicare Other | Admitting: Podiatry

## 2016-07-14 ENCOUNTER — Ambulatory Visit (INDEPENDENT_AMBULATORY_CARE_PROVIDER_SITE_OTHER): Payer: Medicare Other | Admitting: Podiatry

## 2016-07-14 ENCOUNTER — Encounter: Payer: Self-pay | Admitting: Podiatry

## 2016-07-14 DIAGNOSIS — Q828 Other specified congenital malformations of skin: Secondary | ICD-10-CM | POA: Diagnosis not present

## 2016-07-14 DIAGNOSIS — B351 Tinea unguium: Secondary | ICD-10-CM | POA: Diagnosis not present

## 2016-07-14 DIAGNOSIS — M79676 Pain in unspecified toe(s): Secondary | ICD-10-CM

## 2016-07-14 NOTE — Progress Notes (Signed)
He presents today to complaint of painful elongated toenails bilateral is also complaining of calluses and distal aspect of his toes. States his diabetes is under all.  Objective: Vital signs are stable he's alert and oriented 3 pulses remain palpable severe hammertoe deformity resulting in distal clavi currently non-or open ulceration. His toenails are long thick yellow dystrophic clinic for mycotic painful palpation.  Assessment: Pain in limb seeing her onychomycosis and poor keratoses.  Plan: Debridement of all reactive hyperkeratotic tissue and debridement of toenails 1 through 5 bilateral. Follow up with him in 1 month reevaluate reactive hyperkeratotic lesions and ulcerations.

## 2016-08-14 ENCOUNTER — Encounter: Payer: Self-pay | Admitting: Radiation Oncology

## 2016-08-14 ENCOUNTER — Ambulatory Visit
Admission: RE | Admit: 2016-08-14 | Discharge: 2016-08-14 | Disposition: A | Discharge status: Home / Self Care | Payer: Medicare Other | Source: Ambulatory Visit | Attending: Radiation Oncology | Admitting: Radiation Oncology

## 2016-08-14 VITALS — BP 112/54 | HR 64 | Temp 97.2°F | Resp 18 | Wt 182.8 lb

## 2016-08-14 DIAGNOSIS — Z9221 Personal history of antineoplastic chemotherapy: Secondary | ICD-10-CM | POA: Insufficient documentation

## 2016-08-14 DIAGNOSIS — Z8551 Personal history of malignant neoplasm of bladder: Secondary | ICD-10-CM | POA: Diagnosis present

## 2016-08-14 DIAGNOSIS — Z923 Personal history of irradiation: Secondary | ICD-10-CM | POA: Diagnosis not present

## 2016-08-14 DIAGNOSIS — C679 Malignant neoplasm of bladder, unspecified: Secondary | ICD-10-CM

## 2016-08-14 NOTE — Progress Notes (Signed)
Radiation Oncology Follow up Note  Name: Herbert Phillips   Date:   08/14/2016 MRN:  [id number] DOB: 1926-03-30    This 83 y.o. male presents to the clinic today for 18 month follow-up status post radiation therapy for transitional cell carcinoma the bladder.  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 He is seen today and is doing well. Initially his muscularis propria in lamina propria were involved by cancer with lymphovascular invasion present.Marland Kitchen He recently had a repeat cystoscopy showing no evidence of disease. He has very little symptoms has nocturia 1 no significant diarrhea or dysuria.  COMPLICATIONS OF TREATMENT: none  FOLLOW UP COMPLIANCE: keeps appointments   PHYSICAL EXAM:  BP (!) 112/54   Pulse 64   Temp 97.2 F (36.2 C)   Resp 18   Wt 182 lb 12.2 oz (82.9 kg)   BMI 24.79 kg/m  Elderly gentleman in NAD. Well-developed well-nourished patient in NAD. HEENT reveals PERLA, EOMI, discs not visualized.  Oral cavity is clear. No oral mucosal lesions are identified. Neck is clear without evidence of cervical or supraclavicular adenopathy. Lungs are clear to A&P. Cardiac examination is essentially unremarkable with regular rate and rhythm without murmur rub or thrill. Abdomen is benign with no organomegaly or masses noted. Motor sensory and DTR levels are equal and symmetric in the upper and lower extremities. Cranial nerves II through XII are grossly intact. Proprioception is intact. No peripheral adenopathy or edema is identified. No motor or sensory levels are noted. Crude visual fields are within normal range.  RADIOLOGY RESULTS: No current films for review  PLAN: Present time patient continues to do well with no evidence of disease. I'm please was overall progress. I've  asked to see him back in 1 year for follow-up. Patient is to call sooner with any concerns.  I would like to take this opportunity to thank you for allowing me to participate in the care of your patient.Armstead Peaks., MD

## 2016-08-18 ENCOUNTER — Ambulatory Visit (INDEPENDENT_AMBULATORY_CARE_PROVIDER_SITE_OTHER): Payer: Medicare Other | Admitting: Podiatry

## 2016-08-18 ENCOUNTER — Encounter: Payer: Self-pay | Admitting: Podiatry

## 2016-08-18 DIAGNOSIS — L97511 Non-pressure chronic ulcer of other part of right foot limited to breakdown of skin: Secondary | ICD-10-CM | POA: Diagnosis not present

## 2016-08-18 NOTE — Progress Notes (Signed)
He presents today states that he is doing better I really can't see it so I can't tell very well.  Objective: Vital signs are stable he is alert and oriented 3 ulcer to the fourth digit of the right foot has completely resolved. Pulses remain palpable no other open lesions or wounds are noted.  Assessment: Well-healing ulceration fourth toe right foot.  Plan: Follow up with me in 2 months for nail debridement. We will reassess his ulcers at that time if necessary.

## 2016-10-22 ENCOUNTER — Ambulatory Visit (INDEPENDENT_AMBULATORY_CARE_PROVIDER_SITE_OTHER): Payer: Medicare Other | Admitting: Podiatry

## 2016-10-22 ENCOUNTER — Encounter: Payer: Self-pay | Admitting: Podiatry

## 2016-10-22 DIAGNOSIS — L97511 Non-pressure chronic ulcer of other part of right foot limited to breakdown of skin: Secondary | ICD-10-CM | POA: Diagnosis not present

## 2016-10-22 DIAGNOSIS — Q828 Other specified congenital malformations of skin: Secondary | ICD-10-CM

## 2016-10-22 DIAGNOSIS — B351 Tinea unguium: Secondary | ICD-10-CM

## 2016-10-22 DIAGNOSIS — M79676 Pain in unspecified toe(s): Secondary | ICD-10-CM

## 2016-10-22 NOTE — Progress Notes (Signed)
He presents today for follow-up of ulceration fourth digit of the right foot which has gotten better he says. He also like to have his nails trimmed with a painful area  Objective: Vital signs are stable alert and oriented 3. Pulses are strongly palpable bilateral. No open wounds or lesions or are noted. Toenails are long thick yellow dystrophic onychomycotic.  Assessment: Onychomycosis with pain in limb.  Plan: Debridement of all reactive hyperkeratosis bilateral.

## 2016-11-24 ENCOUNTER — Ambulatory Visit
Admission: RE | Admit: 2016-11-24 | Discharge: 2016-11-24 | Disposition: A | Discharge status: Home / Self Care | Payer: Medicare Other | Source: Ambulatory Visit | Attending: Internal Medicine | Admitting: Internal Medicine

## 2016-11-24 DIAGNOSIS — C678 Malignant neoplasm of overlapping sites of bladder: Secondary | ICD-10-CM | POA: Diagnosis not present

## 2016-11-24 DIAGNOSIS — I700 Atherosclerosis of aorta: Secondary | ICD-10-CM | POA: Insufficient documentation

## 2016-11-24 LAB — POCT I-STAT CREATININE: Creatinine, Ser: 1 mg/dL (ref 0.61–1.24)

## 2016-11-24 MED ORDER — IOPAMIDOL (ISOVUE-300) INJECTION 61%
100.0000 mL | Freq: Once | INTRAVENOUS | Status: AC | PRN
  Administered 2016-11-24: 100 mL via INTRAVENOUS

## 2016-12-01 ENCOUNTER — Inpatient Hospital Stay: Payer: Medicare Other | Attending: Hematology and Oncology

## 2016-12-01 ENCOUNTER — Encounter: Payer: Self-pay | Admitting: Hematology and Oncology

## 2016-12-01 ENCOUNTER — Inpatient Hospital Stay (HOSPITAL_BASED_OUTPATIENT_CLINIC_OR_DEPARTMENT_OTHER): Payer: Medicare Other | Admitting: Hematology and Oncology

## 2016-12-01 VITALS — BP 111/69 | HR 65 | Temp 98.3°F | Resp 18 | Wt 183.3 lb

## 2016-12-01 DIAGNOSIS — Z7984 Long term (current) use of oral hypoglycemic drugs: Secondary | ICD-10-CM | POA: Diagnosis not present

## 2016-12-01 DIAGNOSIS — Z7982 Long term (current) use of aspirin: Secondary | ICD-10-CM | POA: Insufficient documentation

## 2016-12-01 DIAGNOSIS — E119 Type 2 diabetes mellitus without complications: Secondary | ICD-10-CM

## 2016-12-01 DIAGNOSIS — C679 Malignant neoplasm of bladder, unspecified: Secondary | ICD-10-CM

## 2016-12-01 DIAGNOSIS — E538 Deficiency of other specified B group vitamins: Secondary | ICD-10-CM | POA: Diagnosis not present

## 2016-12-01 DIAGNOSIS — I700 Atherosclerosis of aorta: Secondary | ICD-10-CM | POA: Diagnosis not present

## 2016-12-01 DIAGNOSIS — Z87891 Personal history of nicotine dependence: Secondary | ICD-10-CM | POA: Insufficient documentation

## 2016-12-01 DIAGNOSIS — G4730 Sleep apnea, unspecified: Secondary | ICD-10-CM | POA: Diagnosis not present

## 2016-12-01 DIAGNOSIS — Z923 Personal history of irradiation: Secondary | ICD-10-CM | POA: Insufficient documentation

## 2016-12-01 DIAGNOSIS — Z9221 Personal history of antineoplastic chemotherapy: Secondary | ICD-10-CM

## 2016-12-01 DIAGNOSIS — R001 Bradycardia, unspecified: Secondary | ICD-10-CM | POA: Diagnosis not present

## 2016-12-01 DIAGNOSIS — C678 Malignant neoplasm of overlapping sites of bladder: Secondary | ICD-10-CM

## 2016-12-01 DIAGNOSIS — Z79899 Other long term (current) drug therapy: Secondary | ICD-10-CM | POA: Diagnosis not present

## 2016-12-01 DIAGNOSIS — M129 Arthropathy, unspecified: Secondary | ICD-10-CM | POA: Insufficient documentation

## 2016-12-01 LAB — COMPREHENSIVE METABOLIC PANEL
ALT: 15 U/L — ABNORMAL LOW (ref 17–63)
ANION GAP: 5 (ref 5–15)
AST: 22 U/L (ref 15–41)
Albumin: 3.5 g/dL (ref 3.5–5.0)
Alkaline Phosphatase: 69 U/L (ref 38–126)
BILIRUBIN TOTAL: 0.4 mg/dL (ref 0.3–1.2)
BUN: 21 mg/dL — AB (ref 6–20)
CHLORIDE: 106 mmol/L (ref 101–111)
CO2: 26 mmol/L (ref 22–32)
Calcium: 9 mg/dL (ref 8.9–10.3)
Creatinine, Ser: 1.07 mg/dL (ref 0.61–1.24)
GFR, EST NON AFRICAN AMERICAN: 60 mL/min — AB (ref >60)
Glucose, Bld: 176 mg/dL — ABNORMAL HIGH (ref 65–99)
POTASSIUM: 4.1 mmol/L (ref 3.5–5.1)
Sodium: 137 mmol/L (ref 135–145)
TOTAL PROTEIN: 7.2 g/dL (ref 6.5–8.1)

## 2016-12-01 LAB — CBC WITH DIFFERENTIAL/PLATELET
BASOS ABS: 0.1 10*3/uL (ref 0–0.1)
Basophils Relative: 1 %
EOS PCT: 2 %
Eosinophils Absolute: 0.2 10*3/uL (ref 0–0.7)
HEMATOCRIT: 43.9 % (ref 40.0–52.0)
Hemoglobin: 15.2 g/dL (ref 13.0–18.0)
LYMPHS PCT: 15 %
Lymphs Abs: 1 10*3/uL (ref 1.0–3.6)
MCH: 32.6 pg (ref 26.0–34.0)
MCHC: 34.6 g/dL (ref 32.0–36.0)
MCV: 94.2 fL (ref 80.0–100.0)
Monocytes Absolute: 0.6 10*3/uL (ref 0.2–1.0)
Monocytes Relative: 9 %
NEUTROS ABS: 5 10*3/uL (ref 1.4–6.5)
Neutrophils Relative %: 73 %
PLATELETS: 203 10*3/uL (ref 150–440)
RBC: 4.66 MIL/uL (ref 4.40–5.90)
RDW: 14.4 % (ref 11.5–14.5)
WBC: 6.9 10*3/uL (ref 3.8–10.6)

## 2016-12-01 NOTE — Progress Notes (Signed)
[place] OFFICE PROGRESS NOTE  Patient Care Team: Albina Billet, MD as PCP - General (Internal Medicine) Royston Cowper, MD as Consulting Physician (Urology)  Cancer Staging Bladder cancer [facility]) Staging form: Urinary Bladder, AJCC 7th Edition - Clinical: Stage II (T2, N0, M0) - Signed by Forest Gleason, MD on 11/15/2014    Oncology History   1 7 had hematuria followed by cystoscopy on May 3, 2016the scan and cystoscopy indicated 2 x 2 centimeter papillary bladder tumor on the right side of the posterior wall biopsy and urine cytology was positive for transitional cell carcinoma.  Tumor was muscle invasion with lymphovascular invasion Patient  had a transurethral resection of bladder tumor (Oct 24, 2014) 3.patient will start cis-platinum and radiation therapy from June of 2016 4.  Patient has finished total 6 cycles of chemotherapy with cis-platinum on July s/p RT finished on August 4 of 2016.  # last cystoscopy- Dr.Wolfe/Urology- Nov 2017- NED as per pt.        Bladder cancer ([place])   10/24/2014 Initial Diagnosis    Bladder cancer       Cancer of overlapping sites of bladder [facility]     This is my first interaction with the patient as patient's primary oncologist has been Dr.Choksi. I reviewed the patient's prior charts/pertinent labs/imaging in detail; findings are summarized above.    INTERVAL HISTORY: last seen 05/30/2016.  Herbert Phillips 83 y.o.  male pleasant patient above history of Stage II bladder cancer underwent bladder sparing chemoradiation is here for follow-up. Patient had a cystoscopy 3-4 weeks prior as the patient negative.  Patient denies any blood in urine. Denies any dysuria. Denies abdominal pain. Denies any shortness of breath or chest pain or cough. Denies any bone pain. He is fairly active for his age.   The patient was last seen in the medical oncology clinic on 05/30/2016.  During the interim, he has felt great. He states if I  have a problem, I don't know about it".  He returns to day for 6 month assessment and review of interval imaging.  He lives at [place].   Abdomen and pelvic CT scan on 11/24/2016 revealed posterior bladder wall abnormality seen on the previous study is no longer evident. No findings to suggest metastatic disease in the abdomen or pelvis on today's study. Abdominal aortic atherosclerosis.   REVIEW OF SYSTEMS:  A complete 10 point review of system is done which is negative except mentioned above/history of present illness.   PAST MEDICAL HISTORY :  Past Medical History:  Diagnosis Date  . Arthritis   . B12 deficiency   . Bladder cancer ([place])    2 yrs ago  . Dysrhythmia    Bradycardia  . Hip fracture, left ([place]) 02/04/2016  . Hypertension   . Non-insulin dependent type 2 diabetes mellitus ([place])   . Sleep apnea     PAST SURGICAL HISTORY :   Past Surgical History:  Procedure Laterality Date  . cyst on spine  1961  . EAR CYST EXCISION    . INTRAMEDULLARY (IM) NAIL INTERTROCHANTERIC Left 02/04/2016   Procedure: INTRAMEDULLARY (IM) NAIL INTERTROCHANTRIC;  Surgeon: Corky Mull, MD;  Location: [facility] ORS;  Service: Orthopedics;  Laterality: Left;  . TRANSURETHRAL RESECTION OF BLADDER TUMOR N/A 10/24/2014   Procedure: TRANSURETHRAL RESECTION OF BLADDER TUMOR (TURBT);  Surgeon: Royston Cowper, MD;  Location: [facility] ORS;  Service: Urology;  Laterality: N/A;    FAMILY HISTORY :   Family History  Problem Relation Age of Onset  . Diabetes Neg Hx     SOCIAL HISTORY:   Social History  Substance Use Topics  . Smoking status: Former Smoker    Quit date: 06/13/1976  . Smokeless tobacco: Never Used  . Alcohol use Yes     Comment: monthly 3 beers since xmas    ALLERGIES:  has No Known Allergies.  MEDICATIONS:  Current Outpatient Prescriptions  Medication Sig Dispense Refill  . allopurinol (ZYLOPRIM) 100 MG tablet Take 100 mg by mouth daily.     Marland Kitchen aspirin EC 81 MG tablet Take 81 mg by  mouth daily.     . Cholecalciferol (VITAMIN D PO) Take by mouth.    . diphenhydrAMINE (BENADRYL) 12.5 MG/5ML elixir Take 5-10 mLs (12.5-25 mg total) by mouth every 4 (four) hours as needed for itching. 120 mL 0  . docusate sodium (COLACE) 100 MG capsule Take 1 capsule (100 mg total) by mouth 2 (two) times daily. 10 capsule 0  . furosemide (LASIX) 20 MG tablet Take 20 mg by mouth as needed.     . Glucosamine Sulfate (GLUCOSAMINE RELIEF) 1000 MG TABS Take 1,000 mg by mouth daily.     Marland Kitchen glyBURIDE (DIABETA) 5 MG tablet Take 5 mg by mouth daily with breakfast.    . lovastatin (MEVACOR) 20 MG tablet Take 20 mg by mouth daily at 6 PM.     . metFORMIN (GLUCOPHAGE) 500 MG tablet Take 500 mg by mouth 2 (two) times daily with a meal.     . mirabegron ER (MYRBETRIQ) 25 MG TB24 tablet Take 1 tablet (25 mg total) by mouth daily. 30 tablet 5  . ramipril (ALTACE) 2.5 MG capsule Take 2.5 mg by mouth daily.    . vitamin B-12 (CYANOCOBALAMIN) 1000 MCG tablet Take 1,000 mcg by mouth daily.    . bacitracin ointment Apply topically 2 (two) times daily. (Patient not taking: Reported on 12/01/2016) 120 g 0  . enoxaparin (LOVENOX) 40 MG/0.4ML injection Inject 0.4 mLs (40 mg total) into the skin daily. (Patient not taking: Reported on 05/30/2016) 14 Syringe 0  . magnesium hydroxide (MILK OF MAGNESIA) 400 MG/5ML suspension Take 30 mLs by mouth daily as needed for mild constipation. (Patient not taking: Reported on 05/30/2016) 360 mL 0  . Nutritional Supplements (BLADDER 2.2) TABS Take 1 tablet by mouth daily.      No current facility-administered medications for this visit.     PHYSICAL EXAMINATION: ECOG PERFORMANCE STATUS: 0 - Asymptomatic  BP 111/69 (BP Location: Left Arm, Patient Position: Sitting)   Pulse 65   Temp 98.3 F (36.8 C) (Tympanic)   Resp 18   Wt 183 lb 5 oz (83.2 kg)   BMI 24.86 kg/m   Filed Weights   12/01/16 1526  Weight: 183 lb 5 oz (83.2 kg)    GENERAL:  Well developed, well nourished,  elderly gentleman sitting comfortably in the exam room in no acute distress.  He is accompanied by his daughter-in-law. MENTAL STATUS:  Alert and oriented to person, place and time. HEAD:  White hair.  Normocephalic, atraumatic, face symmetric, no Cushingoid features. EYES:  Black rimmed glasses.  Blue eyes.  Pupils equal round and reactive to light and accomodation.  No conjunctivitis or scleral icterus. ENT:  Oropharynx clear without lesion.  Tongue normal. Mucous membranes moist.  RESPIRATORY:  Clear to auscultation without rales, wheezes or rhonchi. CARDIOVASCULAR:  Regular rate and rhythm without murmur, rub or gallop. ABDOMEN:  Soft, non-tender, with active bowel  sounds, and no hepatosplenomegaly.  No masses. SKIN:  Right elbow bandage.  No rashes, ulcers or lesions. EXTREMITIES: Slightly pink legs (left > right).  No evidence of infection.  No edema, no skin discoloration or tenderness.  No palpable cords. LYMPH NODES: No palpable cervical, supraclavicular, axillary or inguinal adenopathy  NEUROLOGICAL: Unremarkable. PSYCH:  Appropriate.    LABORATORY DATA:  I have reviewed the data as listed    Component Value Date/Time   NA 137 12/01/2016 1412   NA 141 10/06/2014 0451   K 4.1 12/01/2016 1412   K 3.6 10/06/2014 0451   CL 106 12/01/2016 1412   CL 109 10/06/2014 0451   CO2 26 12/01/2016 1412   CO2 28 10/06/2014 0451   GLUCOSE 176 (H) 12/01/2016 1412   GLUCOSE 147 (H) 10/06/2014 0451   BUN 21 (H) 12/01/2016 1412   BUN 25 (H) 10/06/2014 0451   CREATININE 1.07 12/01/2016 1412   CREATININE 0.95 10/06/2014 0451   CALCIUM 9.0 12/01/2016 1412   CALCIUM 8.4 (L) 10/06/2014 0451   PROT 7.2 12/01/2016 1412   PROT 7.7 10/05/2014 1526   ALBUMIN 3.5 12/01/2016 1412   ALBUMIN 3.7 10/05/2014 1526   AST 22 12/01/2016 1412   AST 19 10/05/2014 1526   ALT 15 (L) 12/01/2016 1412   ALT 13 (L) 10/05/2014 1526   ALKPHOS 69 12/01/2016 1412   ALKPHOS 51 10/05/2014 1526   BILITOT 0.4  12/01/2016 1412   BILITOT 0.7 10/05/2014 1526   GFRNONAA 60 (L) 12/01/2016 1412   GFRNONAA >60 10/06/2014 0451   GFRAA >60 12/01/2016 1412   GFRAA >60 10/06/2014 0451    No results found for: SPEP, UPEP  Lab Results  Component Value Date   WBC 6.9 12/01/2016   NEUTROABS 5.0 12/01/2016   HGB 15.2 12/01/2016   HCT 43.9 12/01/2016   MCV 94.2 12/01/2016   PLT 203 12/01/2016      Chemistry      Component Value Date/Time   NA 137 12/01/2016 1412   NA 141 10/06/2014 0451   K 4.1 12/01/2016 1412   K 3.6 10/06/2014 0451   CL 106 12/01/2016 1412   CL 109 10/06/2014 0451   CO2 26 12/01/2016 1412   CO2 28 10/06/2014 0451   BUN 21 (H) 12/01/2016 1412   BUN 25 (H) 10/06/2014 0451   CREATININE 1.07 12/01/2016 1412   CREATININE 0.95 10/06/2014 0451      Component Value Date/Time   CALCIUM 9.0 12/01/2016 1412   CALCIUM 8.4 (L) 10/06/2014 0451   ALKPHOS 69 12/01/2016 1412   ALKPHOS 51 10/05/2014 1526   AST 22 12/01/2016 1412   AST 19 10/05/2014 1526   ALT 15 (L) 12/01/2016 1412   ALT 13 (L) 10/05/2014 1526   BILITOT 0.4 12/01/2016 1412   BILITOT 0.7 10/05/2014 1526     IMPRESSION:  2.7 cm mural soft tissue mass along the right posterior wall of the  urinary bladder, consistent with transitional cell carcinoma.   No evidence of metastatic disease or upper urinary tract neoplasm.   Electronically Signed    By: Earle Gell M.D.    On: 09/28/2014 15:41    RADIOGRAPHIC STUDIES: I have personally reviewed the radiological images as listed and agreed with the findings in the report. No results found.   ASSESSMENT & PLAN:   Bladder cancer stage- stage II; s/p chemo-RT last cystoscopy- [mid Nov 2017; dr.wolfe]; repeat in 6 months; negative as per pt.  CT scans reveals no evidence of  recurrence.  # elevated alkaline phos- normal today.  # follow up in 6 months/labs (CBC with diff, CMP) with Dr. Rogue Bussing.  No orders of the defined types were placed in this  encounter.  All questions were answered. The patient knows to call the clinic with any problems, questions or concerns.      Lequita Asal, MD 12/01/2016 3:52 PM

## 2016-12-01 NOTE — Progress Notes (Signed)
Patient here today for CT results.  Patient also has bilateral redness in lower extremities.

## 2016-12-22 ENCOUNTER — Ambulatory Visit: Payer: Medicare Other | Admitting: Podiatry

## 2017-01-12 ENCOUNTER — Encounter: Payer: Self-pay | Admitting: Podiatry

## 2017-01-12 ENCOUNTER — Ambulatory Visit (INDEPENDENT_AMBULATORY_CARE_PROVIDER_SITE_OTHER): Payer: Medicare Other | Admitting: Podiatry

## 2017-01-12 DIAGNOSIS — B351 Tinea unguium: Secondary | ICD-10-CM

## 2017-01-12 DIAGNOSIS — M79676 Pain in unspecified toe(s): Secondary | ICD-10-CM

## 2017-01-12 DIAGNOSIS — Q828 Other specified congenital malformations of skin: Secondary | ICD-10-CM

## 2017-01-12 NOTE — Progress Notes (Signed)
He presents today chief complaint of painful elongated toenails.  Objective: Pulses are palpable. Tenderness along the talar dystrophic with mycotic multiple porokeratosis on her medial and medial aspect of the bilateral foot.  Assessment: Pain limb secondary to onychomycosis and porokeratosis.  Plan: Debridement toenails with corns and calluses bilateral foot.

## 2017-04-15 ENCOUNTER — Encounter: Payer: Self-pay | Admitting: Podiatry

## 2017-04-15 ENCOUNTER — Ambulatory Visit (INDEPENDENT_AMBULATORY_CARE_PROVIDER_SITE_OTHER): Payer: Medicare Other | Admitting: Podiatry

## 2017-04-15 DIAGNOSIS — M79676 Pain in unspecified toe(s): Secondary | ICD-10-CM | POA: Diagnosis not present

## 2017-04-15 DIAGNOSIS — B351 Tinea unguium: Secondary | ICD-10-CM | POA: Diagnosis not present

## 2017-04-15 DIAGNOSIS — Q828 Other specified congenital malformations of skin: Secondary | ICD-10-CM

## 2017-04-15 NOTE — Progress Notes (Signed)
He presents today chief complaint of painful elongated toenails. Is also complaining of painful lesions.  Objective: Pulses remain palpable hammertoe deformities are present porokeratotic lesions benign soft tissue lesion plantar aspect of the bilateral foot he also has long thick yellow dystrophic mycotic nails sharp incurvated nail margins which are painful palpation as well as debridement.  Assessment: Pain in limb secondary to onychomycosis and benign soft tissue lesions plantar aspect bilateral foot.  Plan: Debridement of toenails 1 through 5 bilateral debridement of all reactive hyperkeratoses bilateral.

## 2017-06-05 ENCOUNTER — Inpatient Hospital Stay: Payer: Medicare Other | Attending: Internal Medicine

## 2017-06-05 ENCOUNTER — Inpatient Hospital Stay (HOSPITAL_BASED_OUTPATIENT_CLINIC_OR_DEPARTMENT_OTHER): Payer: Medicare Other | Admitting: Internal Medicine

## 2017-06-05 VITALS — BP 121/63 | HR 65 | Temp 98.1°F | Resp 16 | Wt 183.0 lb

## 2017-06-05 DIAGNOSIS — G4730 Sleep apnea, unspecified: Secondary | ICD-10-CM

## 2017-06-05 DIAGNOSIS — E1165 Type 2 diabetes mellitus with hyperglycemia: Secondary | ICD-10-CM | POA: Diagnosis not present

## 2017-06-05 DIAGNOSIS — R001 Bradycardia, unspecified: Secondary | ICD-10-CM | POA: Insufficient documentation

## 2017-06-05 DIAGNOSIS — Z79899 Other long term (current) drug therapy: Secondary | ICD-10-CM | POA: Insufficient documentation

## 2017-06-05 DIAGNOSIS — I10 Essential (primary) hypertension: Secondary | ICD-10-CM

## 2017-06-05 DIAGNOSIS — Z7982 Long term (current) use of aspirin: Secondary | ICD-10-CM | POA: Insufficient documentation

## 2017-06-05 DIAGNOSIS — Z9221 Personal history of antineoplastic chemotherapy: Secondary | ICD-10-CM | POA: Insufficient documentation

## 2017-06-05 DIAGNOSIS — Z8781 Personal history of (healed) traumatic fracture: Secondary | ICD-10-CM | POA: Diagnosis not present

## 2017-06-05 DIAGNOSIS — Z8551 Personal history of malignant neoplasm of bladder: Secondary | ICD-10-CM | POA: Diagnosis not present

## 2017-06-05 DIAGNOSIS — C678 Malignant neoplasm of overlapping sites of bladder: Secondary | ICD-10-CM

## 2017-06-05 DIAGNOSIS — C679 Malignant neoplasm of bladder, unspecified: Secondary | ICD-10-CM

## 2017-06-05 DIAGNOSIS — Z923 Personal history of irradiation: Secondary | ICD-10-CM

## 2017-06-05 DIAGNOSIS — E538 Deficiency of other specified B group vitamins: Secondary | ICD-10-CM | POA: Diagnosis not present

## 2017-06-05 DIAGNOSIS — M129 Arthropathy, unspecified: Secondary | ICD-10-CM | POA: Insufficient documentation

## 2017-06-05 DIAGNOSIS — Z87891 Personal history of nicotine dependence: Secondary | ICD-10-CM

## 2017-06-05 DIAGNOSIS — Z7984 Long term (current) use of oral hypoglycemic drugs: Secondary | ICD-10-CM

## 2017-06-05 LAB — CBC WITH DIFFERENTIAL/PLATELET
Basophils Absolute: 0.1 10*3/uL (ref 0–0.1)
Basophils Relative: 1 %
Eosinophils Absolute: 0.2 10*3/uL (ref 0–0.7)
Eosinophils Relative: 2 %
HCT: 46.1 % (ref 40.0–52.0)
Hemoglobin: 15.3 g/dL (ref 13.0–18.0)
Lymphocytes Relative: 19 %
Lymphs Abs: 1.4 10*3/uL (ref 1.0–3.6)
MCH: 32.3 pg (ref 26.0–34.0)
MCHC: 33.2 g/dL (ref 32.0–36.0)
MCV: 97.3 fL (ref 80.0–100.0)
Monocytes Absolute: 0.7 10*3/uL (ref 0.2–1.0)
Monocytes Relative: 10 %
Neutro Abs: 4.8 10*3/uL (ref 1.4–6.5)
Neutrophils Relative %: 68 %
Platelets: 191 10*3/uL (ref 150–440)
RBC: 4.74 MIL/uL (ref 4.40–5.90)
RDW: 13.7 % (ref 11.5–14.5)
WBC: 7.2 10*3/uL (ref 3.8–10.6)

## 2017-06-05 LAB — COMPREHENSIVE METABOLIC PANEL
ALT: 17 U/L (ref 17–63)
AST: 25 U/L (ref 15–41)
Albumin: 3.5 g/dL (ref 3.5–5.0)
Alkaline Phosphatase: 61 U/L (ref 38–126)
Anion gap: 9 (ref 5–15)
BUN: 28 mg/dL — ABNORMAL HIGH (ref 6–20)
CO2: 24 mmol/L (ref 22–32)
Calcium: 8.9 mg/dL (ref 8.9–10.3)
Chloride: 103 mmol/L (ref 101–111)
Creatinine, Ser: 1.12 mg/dL (ref 0.61–1.24)
GFR calc Af Amer: >60 mL/min (ref >60)
GFR calc non Af Amer: 56 mL/min — ABNORMAL LOW (ref >60)
Glucose, Bld: 232 mg/dL — ABNORMAL HIGH (ref 65–99)
Potassium: 4.1 mmol/L (ref 3.5–5.1)
Sodium: 136 mmol/L (ref 135–145)
Total Bilirubin: 0.5 mg/dL (ref 0.3–1.2)
Total Protein: 6.9 g/dL (ref 6.5–8.1)

## 2017-06-05 NOTE — Progress Notes (Signed)
[place] OFFICE PROGRESS NOTE  Patient Care Team: Albina Billet, MD as PCP - General (Internal Medicine) Royston Cowper, MD as Consulting Physician (Urology)  Cancer Staging Cancer of overlapping sites of bladder [facility]) Staging form: Urinary Bladder, AJCC 7th Edition - Clinical: No stage assigned - Unsigned    Oncology History   1 7 had hematuria followed by cystoscopy on May 3, 2016the scan and cystoscopy indicated 2 x 2 centimeter papillary bladder tumor on the right side of the posterior wall biopsy and urine cytology was positive for transitional cell carcinoma.  Tumor was muscle invasion with lymphovascular invasion Patient  had a transurethral resection of bladder tumor (Oct 24, 2014) 3.patient will start cis-platinum and radiation therapy from June of 2016 4.  Patient has finished total 6 cycles of chemotherapy with cis-platinum on July s/p RT finished on August 4 of 2016.  # last cystoscopy- Dr.Wolfe/Urology- Nov 2018 NED as per pt.        Cancer of overlapping sites of bladder [facility])      INTERVAL HISTORY:  Pax Reasoner 83 y.o.  male pleasant patient above history of Stage II bladder cancer underwent bladder sparing chemoradiation is here for follow-up. Patient had a cystoscopy few weeks prior with Dr.Wofe. As per patient negative.  Patient denies any blood in urine. Denies any dysuria. Denies abdominal pain. Denies any shortness of breath or chest pain or cough. Denies any bone pain. He is fairly active for his age.  REVIEW OF SYSTEMS:  A complete 10 point review of system is done which is negative except mentioned above/history of present illness.   PAST MEDICAL HISTORY :  Past Medical History:  Diagnosis Date  . Arthritis   . B12 deficiency   . Bladder cancer ([place])    2 yrs ago  . Dysrhythmia    Bradycardia  . Hip fracture, left ([place]) 02/04/2016  . Hypertension   . Non-insulin dependent type 2 diabetes mellitus ([place])   . Sleep  apnea     PAST SURGICAL HISTORY :   Past Surgical History:  Procedure Laterality Date  . cyst on spine  1961  . EAR CYST EXCISION    . INTRAMEDULLARY (IM) NAIL INTERTROCHANTERIC Left 02/04/2016   Procedure: INTRAMEDULLARY (IM) NAIL INTERTROCHANTRIC;  Surgeon: Corky Mull, MD;  Location: [facility] ORS;  Service: Orthopedics;  Laterality: Left;  . TRANSURETHRAL RESECTION OF BLADDER TUMOR N/A 10/24/2014   Procedure: TRANSURETHRAL RESECTION OF BLADDER TUMOR (TURBT);  Surgeon: Royston Cowper, MD;  Location: [facility] ORS;  Service: Urology;  Laterality: N/A;    FAMILY HISTORY :   Family History  Problem Relation Age of Onset  . Diabetes Neg Hx     SOCIAL HISTORY:   Social History   Tobacco Use  . Smoking status: Former Smoker    Last attempt to quit: 06/13/1976    Years since quitting: 41.0  . Smokeless tobacco: Never Used  Substance Use Topics  . Alcohol use: Yes    Comment: monthly 3 beers since xmas  . Drug use: No    ALLERGIES:  has No Known Allergies.  MEDICATIONS:  Current Outpatient Medications  Medication Sig Dispense Refill  . allopurinol (ZYLOPRIM) 100 MG tablet Take 100 mg by mouth daily.     Marland Kitchen aspirin EC 81 MG tablet Take 81 mg by mouth daily.     . Cholecalciferol (VITAMIN D PO) Take by mouth.    . diphenhydrAMINE (BENADRYL) 12.5 MG/5ML elixir Take 5-10 mLs (  12.5-25 mg total) by mouth every 4 (four) hours as needed for itching. 120 mL 0  . docusate sodium (COLACE) 100 MG capsule Take 1 capsule (100 mg total) by mouth 2 (two) times daily. 10 capsule 0  . furosemide (LASIX) 20 MG tablet Take 20 mg by mouth as needed.     . Glucosamine Sulfate (GLUCOSAMINE RELIEF) 1000 MG TABS Take 1,000 mg by mouth daily.     Marland Kitchen glyBURIDE (DIABETA) 5 MG tablet Take 5 mg by mouth daily with breakfast.    . lovastatin (MEVACOR) 20 MG tablet Take 20 mg by mouth daily at 6 PM.     . magnesium hydroxide (MILK OF MAGNESIA) 400 MG/5ML suspension Take 30 mLs by mouth daily as needed for mild  constipation. 360 mL 0  . metFORMIN (GLUCOPHAGE) 500 MG tablet Take 500 mg by mouth 2 (two) times daily with a meal.     . ramipril (ALTACE) 2.5 MG capsule Take 2.5 mg by mouth daily.    . tamsulosin (FLOMAX) 0.4 MG CAPS capsule Take by mouth.    . vitamin B-12 (CYANOCOBALAMIN) 1000 MCG tablet Take 1,000 mcg by mouth daily.    . bacitracin ointment Apply topically 2 (two) times daily. (Patient not taking: Reported on 12/01/2016) 120 g 0  . enoxaparin (LOVENOX) 40 MG/0.4ML injection Inject 0.4 mLs (40 mg total) into the skin daily. (Patient not taking: Reported on 06/05/2017) 14 Syringe 0  . mirabegron ER (MYRBETRIQ) 25 MG TB24 tablet Take 1 tablet (25 mg total) by mouth daily. (Patient not taking: Reported on 06/05/2017) 30 tablet 5  . traZODone (DESYREL) 50 MG tablet Take by mouth.     No current facility-administered medications for this visit.     PHYSICAL EXAMINATION: ECOG PERFORMANCE STATUS: 0 - Asymptomatic  BP 121/63 (BP Location: Left Arm, Patient Position: Sitting)   Pulse 65   Temp 98.1 F (36.7 C) (Tympanic)   Resp 16   Wt 183 lb (83 kg)   BMI 24.82 kg/m   Filed Weights   06/05/17 1512  Weight: 183 lb (83 kg)    GENERAL: Well-nourished well-developed; Alert, no distress and comfortable.   Accompanied by his son. His walking by himself. EYES: no pallor or icterus OROPHARYNX: no thrush or ulceration; good dentition  NECK: supple, no masses felt LYMPH:  no palpable lymphadenopathy in the cervical, axillary or inguinal regions LUNGS: clear to auscultation and  No wheeze or crackles HEART/CVS: regular rate & rhythm and no murmurs; No lower extremity edema ABDOMEN:abdomen soft, non-tender and normal bowel sounds Musculoskeletal:no cyanosis of digits and no clubbing  PSYCH: alert & oriented x 3 with fluent speech NEURO: no focal motor/sensory deficits SKIN:  no rashes or significant lesions  LABORATORY DATA:  I have reviewed the data as listed    Component Value  Date/Time   NA 136 06/05/2017 1446   NA 141 10/06/2014 0451   K 4.1 06/05/2017 1446   K 3.6 10/06/2014 0451   CL 103 06/05/2017 1446   CL 109 10/06/2014 0451   CO2 24 06/05/2017 1446   CO2 28 10/06/2014 0451   GLUCOSE 232 (H) 06/05/2017 1446   GLUCOSE 147 (H) 10/06/2014 0451   BUN 28 (H) 06/05/2017 1446   BUN 25 (H) 10/06/2014 0451   CREATININE 1.12 06/05/2017 1446   CREATININE 0.95 10/06/2014 0451   CALCIUM 8.9 06/05/2017 1446   CALCIUM 8.4 (L) 10/06/2014 0451   PROT 6.9 06/05/2017 1446   PROT 7.7 10/05/2014 1526  ALBUMIN 3.5 06/05/2017 1446   ALBUMIN 3.7 10/05/2014 1526   AST 25 06/05/2017 1446   AST 19 10/05/2014 1526   ALT 17 06/05/2017 1446   ALT 13 (L) 10/05/2014 1526   ALKPHOS 61 06/05/2017 1446   ALKPHOS 51 10/05/2014 1526   BILITOT 0.5 06/05/2017 1446   BILITOT 0.7 10/05/2014 1526   GFRNONAA 56 (L) 06/05/2017 1446   GFRNONAA >60 10/06/2014 0451   GFRAA >60 06/05/2017 1446   GFRAA >60 10/06/2014 0451    No results found for: SPEP, UPEP  Lab Results  Component Value Date   WBC 7.2 06/05/2017   NEUTROABS 4.8 06/05/2017   HGB 15.3 06/05/2017   HCT 46.1 06/05/2017   MCV 97.3 06/05/2017   PLT 191 06/05/2017      Chemistry      Component Value Date/Time   NA 136 06/05/2017 1446   NA 141 10/06/2014 0451   K 4.1 06/05/2017 1446   K 3.6 10/06/2014 0451   CL 103 06/05/2017 1446   CL 109 10/06/2014 0451   CO2 24 06/05/2017 1446   CO2 28 10/06/2014 0451   BUN 28 (H) 06/05/2017 1446   BUN 25 (H) 10/06/2014 0451   CREATININE 1.12 06/05/2017 1446   CREATININE 0.95 10/06/2014 0451      Component Value Date/Time   CALCIUM 8.9 06/05/2017 1446   CALCIUM 8.4 (L) 10/06/2014 0451   ALKPHOS 61 06/05/2017 1446   ALKPHOS 51 10/05/2014 1526   AST 25 06/05/2017 1446   AST 19 10/05/2014 1526   ALT 17 06/05/2017 1446   ALT 13 (L) 10/05/2014 1526   BILITOT 0.5 06/05/2017 1446   BILITOT 0.7 10/05/2014 1526     IMPRESSION:  2.7 cm mural soft tissue mass along the  right posterior wall of the  urinary bladder, consistent with transitional cell carcinoma.   No evidence of metastatic disease or upper urinary tract neoplasm.   Electronically Signed    By: Earle Gell M.D.    On: 09/28/2014 15:41    RADIOGRAPHIC STUDIES: I have personally reviewed the radiological images as listed and agreed with the findings in the report. No results found.   ASSESSMENT & PLAN:  Cancer of overlapping sites of bladder [facility]) Bladder cancer stage- stage II; s/p chemo-RT last cystoscopy- [mid Nov 2017; dr.wolfe]; repeat in 6 months; negative as per pt.  # eleavted bg- 232-postprandial.  Patient to follow-up with PCP  # elevated alkaline phos- normal  # follow up in 6 months/labs; CT scan 6 months prior  Cc; Dr.Tate.    Orders Placed This Encounter  Procedures  . CT Abdomen Pelvis W Contrast    Standing Status:   Future    Standing Expiration Date:   06/05/2018    Scheduling Instructions:     1-2 days prior to MD visit.    Order Specific Question:   If indicated for the ordered procedure, I authorize the administration of contrast media per Radiology protocol    Answer:   Yes    Order Specific Question:   Preferred imaging location?    Answer:   [facility]    Order Specific Question:   Radiology Contrast Protocol - do NOT remove file path    Answer:   [email or web address]    Order Specific Question:   Reason for Exam additional comments    Answer:   bladder cancer  . CBC with Differential    Standing Status:   Future    Standing Expiration Date:  06/05/2018  . Comprehensive metabolic panel    Standing Status:   Future    Standing Expiration Date:   06/05/2018   All questions were answered. The patient knows to call the clinic with any problems, questions or concerns.      Cammie Sickle, MD 06/05/2017 4:14 PM

## 2017-06-05 NOTE — Assessment & Plan Note (Addendum)
Bladder cancer stage- stage II; s/p chemo-RT last cystoscopy- [mid Nov 2017; dr.wolfe]; repeat in 6 months; negative as per pt.  # eleavted bg- 232-postprandial.  Patient to follow-up with PCP  # elevated alkaline phos- normal  # follow up in 6 months/labs; CT scan 6 months prior  Cc; Dr.Tate.

## 2017-07-20 ENCOUNTER — Ambulatory Visit (INDEPENDENT_AMBULATORY_CARE_PROVIDER_SITE_OTHER): Payer: Medicare Other | Admitting: Podiatry

## 2017-07-20 ENCOUNTER — Encounter: Payer: Self-pay | Admitting: Podiatry

## 2017-07-20 DIAGNOSIS — B351 Tinea unguium: Secondary | ICD-10-CM | POA: Diagnosis not present

## 2017-07-20 DIAGNOSIS — L97511 Non-pressure chronic ulcer of other part of right foot limited to breakdown of skin: Secondary | ICD-10-CM

## 2017-07-20 DIAGNOSIS — Q828 Other specified congenital malformations of skin: Secondary | ICD-10-CM

## 2017-07-20 DIAGNOSIS — M79676 Pain in unspecified toe(s): Secondary | ICD-10-CM

## 2017-07-20 MED ORDER — DOXYCYCLINE HYCLATE 100 MG PO TABS: 100.0000 mg | ORAL_TABLET | Freq: Two times a day (BID) | ORAL | 0 refills | Status: DC

## 2017-07-20 NOTE — Progress Notes (Signed)
He presents today for routine nail debridement bilaterally.  States that his toenails are long and painful may hurt him when he walks.  He states that he must of injured his fourth toe is had a small scab on in the toe has been red recently.  He denies any trauma.  Objective: 84 year old demonstrates palpable pulses slow capillary fill time feet are warm to the touch.  Toenails are long thick yellow dystrophic-like mycotic multiple poor keratomas plantar aspect of the forefoot bilateral.  Fourth toe right foot does demonstrate some surrounding erythema with it appears to be an insect bite or some type of skin breakdown with fibrin deposition.  No purulence no malodor is noted.  Assessment: Cellulitis wound to the dorsal aspect fourth toe right foot.  Pain in limb secondary to onychomycosis.  Plan: Debridement of toenails 1 through 5 bilateral.  Debridement of the wound today and placed him on doxycycline.  On follow-up with him in 1 week make sure he is doing well.  Demonstrating dressing plan for him today.  He understands this is amenable to it we will follow-up with me at that time.

## 2017-08-05 ENCOUNTER — Ambulatory Visit: Payer: Medicare Other | Admitting: Podiatry

## 2017-08-10 ENCOUNTER — Ambulatory Visit (INDEPENDENT_AMBULATORY_CARE_PROVIDER_SITE_OTHER): Payer: Medicare Other | Admitting: Podiatry

## 2017-08-10 ENCOUNTER — Encounter: Payer: Self-pay | Admitting: Podiatry

## 2017-08-10 DIAGNOSIS — L97511 Non-pressure chronic ulcer of other part of right foot limited to breakdown of skin: Secondary | ICD-10-CM | POA: Diagnosis not present

## 2017-08-10 NOTE — Progress Notes (Signed)
He presents today for follow-up of ulceration fourth digit of the right foot.  It seems to be drying up pretty good.  Objective: Vital signs stable alert oriented x3 there is no erythema edema cellulitis drainage or odor to the fourth note toe of the right foot.  There is no ulceration present.  Assessment: Well-healing ulceration.  Plan: Redressed the toe today just to keep the toes separated and I will follow-up with him in 2 months for his routine nail debridement.  He will call sooner should ulceration reappear.

## 2017-08-19 ENCOUNTER — Other Ambulatory Visit: Payer: Self-pay | Admitting: *Deleted

## 2017-08-19 ENCOUNTER — Other Ambulatory Visit: Payer: Self-pay

## 2017-08-19 ENCOUNTER — Ambulatory Visit
Admission: RE | Admit: 2017-08-19 | Discharge: 2017-08-19 | Disposition: A | Discharge status: Home / Self Care | Payer: Medicare Other | Source: Ambulatory Visit | Attending: Radiation Oncology | Admitting: Radiation Oncology

## 2017-08-19 ENCOUNTER — Encounter: Payer: Self-pay | Admitting: Radiation Oncology

## 2017-08-19 VITALS — BP 142/68 | Temp 96.3°F | Resp 20 | Wt 182.9 lb

## 2017-08-19 DIAGNOSIS — C679 Malignant neoplasm of bladder, unspecified: Secondary | ICD-10-CM

## 2017-08-19 DIAGNOSIS — Z8551 Personal history of malignant neoplasm of bladder: Secondary | ICD-10-CM | POA: Insufficient documentation

## 2017-08-19 DIAGNOSIS — Z923 Personal history of irradiation: Secondary | ICD-10-CM | POA: Diagnosis not present

## 2017-08-19 MED ORDER — PILOCARPINE HCL 5 MG PO TABS: 5.0000 mg | ORAL_TABLET | Freq: Two times a day (BID) | ORAL | 11 refills | Status: DC

## 2017-08-19 NOTE — Progress Notes (Signed)
Radiation Oncology Follow up Note  Name: Lorraine Terriquez   Date:   08/19/2017 MRN:  [id number] DOB: April 12, 1926    This 84 y.o. male presents to the clinic today for 2.5 year follow-up status post radiation therapy for transitional cell carcinoma the bladder.  REFERRING PROVIDER: Albina Billet, MD  HPI: Patient is a an 84 year old male now seen out 2.5 years having completed radiation therapy for at least stage II transitional cell carcinoma bladder status post chemotherapy and radiation. Seen today in routine follow-up he is doing well. He specifically denies any increased lower urinary tract symptoms urgency or frequency or nocturia. His bowel function is good.Marland Kitchen His cystoscopies have been fine according to patient. His last CT scan I have reviewed back in June 2018 shows the posterior bladder wall abnormality no longer present. No other evidence of metastatic disease was noted.  COMPLICATIONS OF TREATMENT: none  FOLLOW UP COMPLIANCE: keeps appointments   PHYSICAL EXAM:  BP (!) 142/68   Temp (!) 96.3 F (35.7 C)   Resp 20   Wt 182 lb 14 oz (83 kg)   BMI 24.80 kg/m  Well-developed well-nourished patient in NAD. HEENT reveals PERLA, EOMI, discs not visualized.  Oral cavity is clear. No oral mucosal lesions are identified. Neck is clear without evidence of cervical or supraclavicular adenopathy. Lungs are clear to A&P. Cardiac examination is essentially unremarkable with regular rate and rhythm without murmur rub or thrill. Abdomen is benign with no organomegaly or masses noted. Motor sensory and DTR levels are equal and symmetric in the upper and lower extremities. Cranial nerves II through XII are grossly intact. Proprioception is intact. No peripheral adenopathy or edema is identified. No motor or sensory levels are noted. Crude visual fields are within normal range.  RADIOLOGY RESULTS: Previous CT scan is reviewed and compatible with the above-stated findings  PLAN: At the present  time patient is doing well with no evidence of disease. Aorta has a follow-up CT scan ordered for June 2019. He continues follow-up care with urology. I've asked to see him back in 1 year for follow-up. Patient is to call with any concerns.  I would like to take this opportunity to thank you for allowing me to participate in the care of your patient.Noreene Filbert, MD

## 2017-10-19 ENCOUNTER — Ambulatory Visit (INDEPENDENT_AMBULATORY_CARE_PROVIDER_SITE_OTHER): Payer: Medicare Other | Admitting: Podiatry

## 2017-10-19 ENCOUNTER — Encounter: Payer: Self-pay | Admitting: Podiatry

## 2017-10-19 DIAGNOSIS — B351 Tinea unguium: Secondary | ICD-10-CM | POA: Diagnosis not present

## 2017-10-19 DIAGNOSIS — M79676 Pain in unspecified toe(s): Secondary | ICD-10-CM | POA: Diagnosis not present

## 2017-10-19 DIAGNOSIS — E1142 Type 2 diabetes mellitus with diabetic polyneuropathy: Secondary | ICD-10-CM | POA: Diagnosis not present

## 2017-10-19 DIAGNOSIS — Q828 Other specified congenital malformations of skin: Secondary | ICD-10-CM

## 2017-10-19 NOTE — Progress Notes (Signed)
He presents today with a chief complaint of painful elongated toenails and calluses bilateral.  Objective: Vital signs are stable he is alert and oriented x3 pulses remain palpable hammertoe deformities are still present.  Toenails are long thick yellow dystrophic clinically mycotic no open wounds or lesions.  Reactive hyperkeratosis plantar aspect of the bilateral foot.  Assessment: Pain elicited onychomycosis and porokeratosis.  Plan: Debridement of toenails and calluses bilateral.

## 2017-12-01 ENCOUNTER — Inpatient Hospital Stay: Payer: Medicare Other | Attending: Oncology

## 2017-12-01 DIAGNOSIS — R000 Tachycardia, unspecified: Secondary | ICD-10-CM | POA: Diagnosis not present

## 2017-12-01 DIAGNOSIS — E119 Type 2 diabetes mellitus without complications: Secondary | ICD-10-CM | POA: Insufficient documentation

## 2017-12-01 DIAGNOSIS — C678 Malignant neoplasm of overlapping sites of bladder: Secondary | ICD-10-CM | POA: Insufficient documentation

## 2017-12-01 DIAGNOSIS — Z87891 Personal history of nicotine dependence: Secondary | ICD-10-CM | POA: Insufficient documentation

## 2017-12-01 DIAGNOSIS — M129 Arthropathy, unspecified: Secondary | ICD-10-CM | POA: Diagnosis not present

## 2017-12-01 DIAGNOSIS — I10 Essential (primary) hypertension: Secondary | ICD-10-CM | POA: Insufficient documentation

## 2017-12-01 DIAGNOSIS — G4730 Sleep apnea, unspecified: Secondary | ICD-10-CM | POA: Diagnosis not present

## 2017-12-01 DIAGNOSIS — Z79899 Other long term (current) drug therapy: Secondary | ICD-10-CM | POA: Diagnosis not present

## 2017-12-01 DIAGNOSIS — E538 Deficiency of other specified B group vitamins: Secondary | ICD-10-CM | POA: Insufficient documentation

## 2017-12-01 DIAGNOSIS — Z7982 Long term (current) use of aspirin: Secondary | ICD-10-CM | POA: Insufficient documentation

## 2017-12-01 DIAGNOSIS — I700 Atherosclerosis of aorta: Secondary | ICD-10-CM | POA: Insufficient documentation

## 2017-12-01 LAB — CBC WITH DIFFERENTIAL/PLATELET
BASOS PCT: 1 %
Basophils Absolute: 0.1 10*3/uL (ref 0–0.1)
Eosinophils Absolute: 0.1 10*3/uL (ref 0–0.7)
Eosinophils Relative: 2 %
HEMATOCRIT: 43.7 % (ref 40.0–52.0)
HEMOGLOBIN: 14.8 g/dL (ref 13.0–18.0)
LYMPHS PCT: 21 %
Lymphs Abs: 1.4 10*3/uL (ref 1.0–3.6)
MCH: 32.6 pg (ref 26.0–34.0)
MCHC: 33.9 g/dL (ref 32.0–36.0)
MCV: 96.1 fL (ref 80.0–100.0)
MONOS PCT: 10 %
Monocytes Absolute: 0.6 10*3/uL (ref 0.2–1.0)
NEUTROS ABS: 4.4 10*3/uL (ref 1.4–6.5)
NEUTROS PCT: 66 %
Platelets: 187 10*3/uL (ref 150–440)
RBC: 4.55 MIL/uL (ref 4.40–5.90)
RDW: 14.2 % (ref 11.5–14.5)
WBC: 6.6 10*3/uL (ref 3.8–10.6)

## 2017-12-01 LAB — COMPREHENSIVE METABOLIC PANEL
ALBUMIN: 3.5 g/dL (ref 3.5–5.0)
ALK PHOS: 55 U/L (ref 38–126)
ALT: 14 U/L — ABNORMAL LOW (ref 17–63)
ANION GAP: 9 (ref 5–15)
AST: 22 U/L (ref 15–41)
BUN: 22 mg/dL — ABNORMAL HIGH (ref 6–20)
CALCIUM: 8.9 mg/dL (ref 8.9–10.3)
CHLORIDE: 104 mmol/L (ref 101–111)
CO2: 23 mmol/L (ref 22–32)
Creatinine, Ser: 1 mg/dL (ref 0.61–1.24)
GFR calc Af Amer: >60 mL/min (ref >60)
GFR calc non Af Amer: >60 mL/min (ref >60)
Glucose, Bld: 196 mg/dL — ABNORMAL HIGH (ref 65–99)
POTASSIUM: 4 mmol/L (ref 3.5–5.1)
SODIUM: 136 mmol/L (ref 135–145)
Total Bilirubin: 0.3 mg/dL (ref 0.3–1.2)
Total Protein: 6.8 g/dL (ref 6.5–8.1)

## 2017-12-02 ENCOUNTER — Ambulatory Visit
Admission: RE | Admit: 2017-12-02 | Discharge: 2017-12-02 | Disposition: A | Discharge status: Home / Self Care | Payer: Medicare Other | Source: Ambulatory Visit | Attending: Internal Medicine | Admitting: Internal Medicine

## 2017-12-02 DIAGNOSIS — J439 Emphysema, unspecified: Secondary | ICD-10-CM | POA: Insufficient documentation

## 2017-12-02 DIAGNOSIS — R911 Solitary pulmonary nodule: Secondary | ICD-10-CM | POA: Diagnosis not present

## 2017-12-02 DIAGNOSIS — I700 Atherosclerosis of aorta: Secondary | ICD-10-CM | POA: Diagnosis not present

## 2017-12-02 DIAGNOSIS — C678 Malignant neoplasm of overlapping sites of bladder: Secondary | ICD-10-CM | POA: Insufficient documentation

## 2017-12-02 DIAGNOSIS — I2510 Atherosclerotic heart disease of native coronary artery without angina pectoris: Secondary | ICD-10-CM | POA: Insufficient documentation

## 2017-12-02 MED ORDER — IOPAMIDOL (ISOVUE-300) INJECTION 61%
85.0000 mL | Freq: Once | INTRAVENOUS | Status: AC | PRN
  Administered 2017-12-02: 85 mL via INTRAVENOUS

## 2017-12-04 ENCOUNTER — Other Ambulatory Visit: Payer: Self-pay

## 2017-12-04 ENCOUNTER — Inpatient Hospital Stay (HOSPITAL_BASED_OUTPATIENT_CLINIC_OR_DEPARTMENT_OTHER): Payer: Medicare Other | Admitting: Oncology

## 2017-12-04 ENCOUNTER — Encounter: Payer: Self-pay | Admitting: Oncology

## 2017-12-04 DIAGNOSIS — Z79899 Other long term (current) drug therapy: Secondary | ICD-10-CM

## 2017-12-04 DIAGNOSIS — E119 Type 2 diabetes mellitus without complications: Secondary | ICD-10-CM | POA: Diagnosis not present

## 2017-12-04 DIAGNOSIS — I700 Atherosclerosis of aorta: Secondary | ICD-10-CM | POA: Diagnosis not present

## 2017-12-04 DIAGNOSIS — C678 Malignant neoplasm of overlapping sites of bladder: Secondary | ICD-10-CM | POA: Diagnosis not present

## 2017-12-04 DIAGNOSIS — R000 Tachycardia, unspecified: Secondary | ICD-10-CM

## 2017-12-04 DIAGNOSIS — M129 Arthropathy, unspecified: Secondary | ICD-10-CM

## 2017-12-04 DIAGNOSIS — E538 Deficiency of other specified B group vitamins: Secondary | ICD-10-CM | POA: Diagnosis not present

## 2017-12-04 DIAGNOSIS — G4730 Sleep apnea, unspecified: Secondary | ICD-10-CM | POA: Diagnosis not present

## 2017-12-04 DIAGNOSIS — I10 Essential (primary) hypertension: Secondary | ICD-10-CM | POA: Diagnosis not present

## 2017-12-04 DIAGNOSIS — Z87891 Personal history of nicotine dependence: Secondary | ICD-10-CM | POA: Diagnosis not present

## 2017-12-04 DIAGNOSIS — Z7982 Long term (current) use of aspirin: Secondary | ICD-10-CM

## 2017-12-04 NOTE — Progress Notes (Signed)
[place] OFFICE PROGRESS NOTE  Patient Care Team: Albina Billet, MD as PCP - General (Internal Medicine) Royston Cowper, MD as Consulting Physician (Urology)  Cancer Staging Cancer of overlapping sites of bladder [facility]) Staging form: Urinary Bladder, AJCC 7th Edition - Clinical: No stage assigned - Unsigned    Oncology History   1 7 had hematuria followed by cystoscopy on May 3, 2016the scan and cystoscopy indicated 2 x 2 centimeter papillary bladder tumor on the right side of the posterior wall biopsy and urine cytology was positive for transitional cell carcinoma.  Tumor was muscle invasion with lymphovascular invasion Patient  had a transurethral resection of bladder tumor (Oct 24, 2014) 3.patient will start cis-platinum and radiation therapy from June of 2016 4.  Patient has finished total 6 cycles of chemotherapy with cis-platinum on July s/p RT finished on August 4 of 2016.  # last cystoscopy- Dr.Wolfe/Urology- Nov 2018 NED as per pt.        Cancer of overlapping sites of bladder [facility])      INTERVAL HISTORY: Patient presents today with above history for follow-up.  Patient had a cystoscopy last month with Dr. Eliberto Ivory and reported by patient as being negative.  Patient denies any blood in urine, dysuria, abdominal pain, shortness of breath, chest pain or cough.  Denies any bony pain.  Remains fairly active for his age.  Works out 5 days a week.   REVIEW OF SYSTEMS:   Review of Systems  Constitutional: Negative.  Negative for chills, fever, malaise/fatigue and weight loss.  HENT: Negative for congestion and ear pain.   Eyes: Negative.  Negative for blurred vision and double vision.  Respiratory: Negative.  Negative for cough, sputum production and shortness of breath.   Cardiovascular: Negative.  Negative for chest pain, palpitations and leg swelling.  Gastrointestinal: Negative.  Negative for abdominal pain, constipation, diarrhea, nausea and vomiting.   Genitourinary: Negative for dysuria, frequency and urgency.  Musculoskeletal: Negative for back pain and falls.  Skin: Negative.  Negative for rash.  Neurological: Negative.  Negative for weakness and headaches.  Endo/Heme/Allergies: Negative.  Does not bruise/bleed easily.  Psychiatric/Behavioral: Negative.  Negative for depression. The patient is not nervous/anxious and does not have insomnia.      PAST MEDICAL HISTORY :  Past Medical History:  Diagnosis Date  . Arthritis   . B12 deficiency   . Bladder cancer ([place])    2 yrs ago  . Dysrhythmia    Bradycardia  . Hip fracture, left ([place]) 02/04/2016  . Hypertension   . Non-insulin dependent type 2 diabetes mellitus ([place])   . Sleep apnea     PAST SURGICAL HISTORY :   Past Surgical History:  Procedure Laterality Date  . cyst on spine  1961  . EAR CYST EXCISION    . INTRAMEDULLARY (IM) NAIL INTERTROCHANTERIC Left 02/04/2016   Procedure: INTRAMEDULLARY (IM) NAIL INTERTROCHANTRIC;  Surgeon: Corky Mull, MD;  Location: [facility] ORS;  Service: Orthopedics;  Laterality: Left;  . TRANSURETHRAL RESECTION OF BLADDER TUMOR N/A 10/24/2014   Procedure: TRANSURETHRAL RESECTION OF BLADDER TUMOR (TURBT);  Surgeon: Royston Cowper, MD;  Location: [facility] ORS;  Service: Urology;  Laterality: N/A;    FAMILY HISTORY :   Family History  Problem Relation Age of Onset  . Diabetes Neg Hx     SOCIAL HISTORY:   Social History   Tobacco Use  . Smoking status: Former Smoker    Last attempt to quit: 06/13/1976  Years since quitting: 41.5  . Smokeless tobacco: Never Used  Substance Use Topics  . Alcohol use: Yes    Comment: monthly 3 beers since xmas  . Drug use: No    ALLERGIES:  has No Known Allergies.  MEDICATIONS:  Current Outpatient Medications  Medication Sig Dispense Refill  . allopurinol (ZYLOPRIM) 100 MG tablet Take 100 mg by mouth daily.     Marland Kitchen aspirin EC 81 MG tablet Take 81 mg by mouth daily.     . bacitracin ointment Apply  topically 2 (two) times daily. 120 g 0  . Cholecalciferol (VITAMIN D PO) Take by mouth.    . diphenhydrAMINE (BENADRYL) 12.5 MG/5ML elixir Take 5-10 mLs (12.5-25 mg total) by mouth every 4 (four) hours as needed for itching. 120 mL 0  . docusate sodium (COLACE) 100 MG capsule Take 1 capsule (100 mg total) by mouth 2 (two) times daily. 10 capsule 0  . doxycycline (VIBRA-TABS) 100 MG tablet Take 1 tablet (100 mg total) by mouth 2 (two) times daily. 20 tablet 0  . furosemide (LASIX) 20 MG tablet Take 20 mg by mouth as needed.     . Glucosamine Sulfate (GLUCOSAMINE RELIEF) 1000 MG TABS Take 1,000 mg by mouth daily.     Marland Kitchen lovastatin (MEVACOR) 20 MG tablet Take 20 mg by mouth daily at 6 PM.     . magnesium hydroxide (MILK OF MAGNESIA) 400 MG/5ML suspension Take 30 mLs by mouth daily as needed for mild constipation. 360 mL 0  . metFORMIN (GLUCOPHAGE) 500 MG tablet Take 500 mg by mouth 2 (two) times daily with a meal.     . mirabegron ER (MYRBETRIQ) 25 MG TB24 tablet Take by mouth.    . Nutritional Supplements (BLADDER 2.2) TABS Take by mouth.    . pilocarpine (SALAGEN) 5 MG tablet Take 1 tablet (5 mg total) by mouth 2 (two) times daily. 60 tablet 11  . ramipril (ALTACE) 2.5 MG capsule Take 2.5 mg by mouth daily.    . tamsulosin (FLOMAX) 0.4 MG CAPS capsule Take by mouth.    . traZODone (DESYREL) 50 MG tablet Take by mouth.    . vitamin B-12 (CYANOCOBALAMIN) 1000 MCG tablet Take 1,000 mcg by mouth daily.    Marland Kitchen glyBURIDE (DIABETA) 5 MG tablet Take 5 mg by mouth 2 (two) times a week. Take Tues and Thurs     No current facility-administered medications for this visit.     PHYSICAL EXAMINATION: ECOG PERFORMANCE STATUS: 0 - Asymptomatic  BP 132/68 (BP Location: Left Arm, Patient Position: Sitting)   Pulse 71   Temp (!) 97.4 F (36.3 C) (Tympanic)   Resp 16   Ht 5\' 10"  (1.778 m)   Wt 177 lb 4.8 oz (80.4 kg)   BMI 25.44 kg/m   Filed Weights   12/04/17 1410  Weight: 177 lb 4.8 oz (80.4 kg)    Physical Exam  Constitutional: He is oriented to person, place, and time. Vital signs are normal. He appears well-developed.  HENT:  Head: Normocephalic and atraumatic.  Eyes: Pupils are equal, round, and reactive to light.  Neck: Normal range of motion.  Cardiovascular: Normal rate, regular rhythm and normal heart sounds.  No murmur heard. Pulmonary/Chest: Effort normal and breath sounds normal. He has no wheezes.  Abdominal: Soft. Normal appearance and bowel sounds are normal. He exhibits no distension. There is no tenderness.  Musculoskeletal: Normal range of motion. He exhibits no edema.  Neurological: He is alert and oriented to person,  place, and time.  Skin: Skin is warm and dry. No rash noted.  Psychiatric: Judgment normal.     LABORATORY DATA:  I have reviewed the data as listed    Component Value Date/Time   NA 136 12/01/2017 1520   NA 141 10/06/2014 0451   K 4.0 12/01/2017 1520   K 3.6 10/06/2014 0451   CL 104 12/01/2017 1520   CL 109 10/06/2014 0451   CO2 23 12/01/2017 1520   CO2 28 10/06/2014 0451   GLUCOSE 196 (H) 12/01/2017 1520   GLUCOSE 147 (H) 10/06/2014 0451   BUN 22 (H) 12/01/2017 1520   BUN 25 (H) 10/06/2014 0451   CREATININE 1.00 12/01/2017 1520   CREATININE 0.95 10/06/2014 0451   CALCIUM 8.9 12/01/2017 1520   CALCIUM 8.4 (L) 10/06/2014 0451   PROT 6.8 12/01/2017 1520   PROT 7.7 10/05/2014 1526   ALBUMIN 3.5 12/01/2017 1520   ALBUMIN 3.7 10/05/2014 1526   AST 22 12/01/2017 1520   AST 19 10/05/2014 1526   ALT 14 (L) 12/01/2017 1520   ALT 13 (L) 10/05/2014 1526   ALKPHOS 55 12/01/2017 1520   ALKPHOS 51 10/05/2014 1526   BILITOT 0.3 12/01/2017 1520   BILITOT 0.7 10/05/2014 1526   GFRNONAA >60 12/01/2017 1520   GFRNONAA >60 10/06/2014 0451   GFRAA >60 12/01/2017 1520   GFRAA >60 10/06/2014 0451    No results found for: SPEP, UPEP  Lab Results  Component Value Date   WBC 6.6 12/01/2017   NEUTROABS 4.4 12/01/2017   HGB 14.8 12/01/2017    HCT 43.7 12/01/2017   MCV 96.1 12/01/2017   PLT 187 12/01/2017      Chemistry      Component Value Date/Time   NA 136 12/01/2017 1520   NA 141 10/06/2014 0451   K 4.0 12/01/2017 1520   K 3.6 10/06/2014 0451   CL 104 12/01/2017 1520   CL 109 10/06/2014 0451   CO2 23 12/01/2017 1520   CO2 28 10/06/2014 0451   BUN 22 (H) 12/01/2017 1520   BUN 25 (H) 10/06/2014 0451   CREATININE 1.00 12/01/2017 1520   CREATININE 0.95 10/06/2014 0451      Component Value Date/Time   CALCIUM 8.9 12/01/2017 1520   CALCIUM 8.4 (L) 10/06/2014 0451   ALKPHOS 55 12/01/2017 1520   ALKPHOS 51 10/05/2014 1526   AST 22 12/01/2017 1520   AST 19 10/05/2014 1526   ALT 14 (L) 12/01/2017 1520   ALT 13 (L) 10/05/2014 1526   BILITOT 0.3 12/01/2017 1520   BILITOT 0.7 10/05/2014 1526     IMPRESSION:  2.7 cm mural soft tissue mass along the right posterior wall of the  urinary bladder, consistent with transitional cell carcinoma.   No evidence of metastatic disease or upper urinary tract neoplasm.   Electronically Signed    By: Earle Gell M.D.    On: 09/28/2014 15:41    RADIOGRAPHIC STUDIES: I have personally reviewed the radiological images as listed and agreed with the findings in the report. CT Scan 12/02/17 IMPRESSION: 1. No acute process or evidence of metastatic disease in the abdomen or pelvis. 2. Aortic atherosclerosis (ICD10-I70.0), coronary artery atherosclerosis and emphysema (ICD10-J43.9). 3. Right middle lobe pulmonary nodule is unchanged, favoring a benign etiology. 4. Bladder morphology suggests a component of outlet obstruction. No recurrent or residual bladder mass.   ASSESSMENT & PLAN:  Cancer of overlapping sites of bladder [facility]) Bladder cancer stage- stage II; s/p chemo-RT last cystoscopy- May 2019  by Dr. Rogers Blocker. Repeat in 6 months; negative as per pt.  Diabetes: Blood sugar 196 today.  Patient compliant with medications.  Follow-up with PCP.  Will provide copy for Dr.  Christien Germany office of recent CT scan.  CT scan did mention bladder morphology suggesting component of outlet obstruction and would like for urology to follow-up on this.  Per patient, his cystoscopy was negative.  RTC 6 months with labs, MD assessment and CT scan a few days prior.     Orders Placed This Encounter  Procedures  . CT Abdomen Pelvis W Contrast    Standing Status:   Future    Standing Expiration Date:   12/04/2018    Order Specific Question:   If indicated for the ordered procedure, I authorize the administration of contrast media per Radiology protocol    Answer:   Yes    Order Specific Question:   Preferred imaging location?    Answer:   [facility]    Order Specific Question:   Is Oral Contrast requested for this exam?    Answer:   Yes, Per Radiology protocol    Order Specific Question:   Radiology Contrast Protocol - do NOT remove file path    Answer:   [email or web address]   All questions were answered. The patient knows to call the clinic with any problems, questions or concerns.      Jacquelin Hawking, NP 12/04/2017 4:29 PM

## 2017-12-04 NOTE — Assessment & Plan Note (Addendum)
Bladder cancer stage- stage II; s/p chemo-RT last cystoscopy- May 2019 by Dr. Rogers Blocker. Repeat in 6 months; negative as per pt.  Diabetes: Blood sugar 196 today.  Patient compliant with medications.  Follow-up with PCP.  Will provide copy for Dr. Alecsander Germany office of recent CT scan.  CT scan did mention bladder morphology suggesting component of outlet obstruction and would like for urology to follow-up on this.  Per patient, his cystoscopy was negative.  RTC 6 months with labs, MD assessment and CT scan a few days prior.

## 2017-12-04 NOTE — Progress Notes (Signed)
Patient here for follow up no changes since his last appointment. 

## 2018-01-25 ENCOUNTER — Encounter: Payer: Self-pay | Admitting: Podiatry

## 2018-01-25 ENCOUNTER — Ambulatory Visit (INDEPENDENT_AMBULATORY_CARE_PROVIDER_SITE_OTHER): Payer: Medicare Other | Admitting: Podiatry

## 2018-01-25 DIAGNOSIS — M79676 Pain in unspecified toe(s): Secondary | ICD-10-CM

## 2018-01-25 DIAGNOSIS — E1142 Type 2 diabetes mellitus with diabetic polyneuropathy: Secondary | ICD-10-CM | POA: Diagnosis not present

## 2018-01-25 DIAGNOSIS — B351 Tinea unguium: Secondary | ICD-10-CM

## 2018-01-25 DIAGNOSIS — Q828 Other specified congenital malformations of skin: Secondary | ICD-10-CM | POA: Diagnosis not present

## 2018-01-25 NOTE — Progress Notes (Signed)
He presents today chief complaint of painful elongated toenails and calluses.  Objective: Pulses remain barely palpable.  No open lesions or wounds are noted.  I debrided all reactive hyperkeratoses for him today I debrided toenails 1 through 5 bilateral for him today.  Assessment: Pain in limb secondary to onychomycosis.  Plan: Debridement of all reactive hyperkeratosis debridement of toenails 1 through 5.

## 2018-02-24 ENCOUNTER — Encounter: Payer: Self-pay | Admitting: Podiatry

## 2018-02-24 ENCOUNTER — Ambulatory Visit (INDEPENDENT_AMBULATORY_CARE_PROVIDER_SITE_OTHER): Payer: Medicare Other | Admitting: Podiatry

## 2018-02-24 DIAGNOSIS — L97511 Non-pressure chronic ulcer of other part of right foot limited to breakdown of skin: Secondary | ICD-10-CM | POA: Diagnosis not present

## 2018-02-24 NOTE — Progress Notes (Signed)
He presents today for follow-up of his fourth toe right foot thinks that seems to be doing better.  Objective: No open lesions or wounds.  All digits demonstrate well healed ulcerations.  No signs of infection.  Assessment: Well-healing ulcerations distal toes left foot.  Plan: Debrided all reactive hyperkeratotic tissue.

## 2018-03-29 DIAGNOSIS — D105 Benign neoplasm of other parts of oropharynx: Secondary | ICD-10-CM | POA: Insufficient documentation

## 2018-03-29 DIAGNOSIS — K149 Disease of tongue, unspecified: Secondary | ICD-10-CM | POA: Insufficient documentation

## 2018-04-05 DIAGNOSIS — R001 Bradycardia, unspecified: Secondary | ICD-10-CM | POA: Insufficient documentation

## 2018-04-19 ENCOUNTER — Encounter: Payer: Self-pay | Admitting: Podiatry

## 2018-04-19 ENCOUNTER — Ambulatory Visit (INDEPENDENT_AMBULATORY_CARE_PROVIDER_SITE_OTHER): Payer: Medicare Other | Admitting: Podiatry

## 2018-04-19 DIAGNOSIS — B351 Tinea unguium: Secondary | ICD-10-CM

## 2018-04-19 DIAGNOSIS — Q828 Other specified congenital malformations of skin: Secondary | ICD-10-CM | POA: Diagnosis not present

## 2018-04-19 DIAGNOSIS — M79676 Pain in unspecified toe(s): Secondary | ICD-10-CM | POA: Diagnosis not present

## 2018-04-19 DIAGNOSIS — E1142 Type 2 diabetes mellitus with diabetic polyneuropathy: Secondary | ICD-10-CM | POA: Diagnosis not present

## 2018-04-19 NOTE — Progress Notes (Signed)
He presents today chief complaint of painful elongated toenails and calluses hammertoes with neuropathy.  Objective: Signs are stable he is alert and oriented x3.  Pulses are palpable neurologic sensorium is diminished per Semmes Weinstein monofilament no open lesions or wounds.  Toenails are thick yellow dystrophic-like mycotic multiple reactive hyper keratomas plantar aspect of the foot.  No open lesions or wounds there.  Assessment: Diabetic peripheral neuropathy hammertoe deformity closed ulcerations porokeratotic lesions and pain in limb secondary onychomycosis.  Plan: Debrided all reactive hyperkeratosis debrided toenails 1 through 5 bilateral.

## 2018-06-01 ENCOUNTER — Ambulatory Visit
Admission: RE | Admit: 2018-06-01 | Discharge: 2018-06-01 | Disposition: A | Discharge status: Home / Self Care | Payer: Medicare Other | Source: Ambulatory Visit | Attending: Oncology | Admitting: Oncology

## 2018-06-01 DIAGNOSIS — C678 Malignant neoplasm of overlapping sites of bladder: Secondary | ICD-10-CM | POA: Insufficient documentation

## 2018-06-01 LAB — POCT I-STAT CREATININE: Creatinine, Ser: 0.9 mg/dL (ref 0.61–1.24)

## 2018-06-01 MED ORDER — IOPAMIDOL (ISOVUE-300) INJECTION 61%
100.0000 mL | Freq: Once | INTRAVENOUS | Status: AC | PRN
  Administered 2018-06-01: 100 mL via INTRAVENOUS

## 2018-06-07 ENCOUNTER — Encounter: Payer: Self-pay | Admitting: Internal Medicine

## 2018-06-07 ENCOUNTER — Inpatient Hospital Stay: Payer: Medicare Other | Attending: Internal Medicine

## 2018-06-07 ENCOUNTER — Other Ambulatory Visit: Payer: Self-pay

## 2018-06-07 ENCOUNTER — Inpatient Hospital Stay (HOSPITAL_BASED_OUTPATIENT_CLINIC_OR_DEPARTMENT_OTHER): Payer: Medicare Other | Admitting: Internal Medicine

## 2018-06-07 ENCOUNTER — Other Ambulatory Visit: Payer: Self-pay | Admitting: *Deleted

## 2018-06-07 VITALS — BP 119/67 | HR 76 | Temp 98.1°F | Resp 18

## 2018-06-07 DIAGNOSIS — G4730 Sleep apnea, unspecified: Secondary | ICD-10-CM

## 2018-06-07 DIAGNOSIS — R001 Bradycardia, unspecified: Secondary | ICD-10-CM

## 2018-06-07 DIAGNOSIS — C678 Malignant neoplasm of overlapping sites of bladder: Secondary | ICD-10-CM

## 2018-06-07 DIAGNOSIS — E119 Type 2 diabetes mellitus without complications: Secondary | ICD-10-CM | POA: Diagnosis not present

## 2018-06-07 DIAGNOSIS — Z79899 Other long term (current) drug therapy: Secondary | ICD-10-CM | POA: Insufficient documentation

## 2018-06-07 DIAGNOSIS — E538 Deficiency of other specified B group vitamins: Secondary | ICD-10-CM | POA: Insufficient documentation

## 2018-06-07 DIAGNOSIS — Z9221 Personal history of antineoplastic chemotherapy: Secondary | ICD-10-CM | POA: Diagnosis not present

## 2018-06-07 DIAGNOSIS — M129 Arthropathy, unspecified: Secondary | ICD-10-CM

## 2018-06-07 DIAGNOSIS — Z87891 Personal history of nicotine dependence: Secondary | ICD-10-CM

## 2018-06-07 DIAGNOSIS — Z923 Personal history of irradiation: Secondary | ICD-10-CM

## 2018-06-07 DIAGNOSIS — Z8551 Personal history of malignant neoplasm of bladder: Secondary | ICD-10-CM | POA: Insufficient documentation

## 2018-06-07 DIAGNOSIS — I10 Essential (primary) hypertension: Secondary | ICD-10-CM | POA: Diagnosis not present

## 2018-06-07 LAB — CBC WITH DIFFERENTIAL/PLATELET
ABS IMMATURE GRANULOCYTES: 0.06 10*3/uL (ref 0.00–0.07)
Basophils Absolute: 0.1 10*3/uL (ref 0.0–0.1)
Basophils Relative: 1 %
Eosinophils Absolute: 0.2 10*3/uL (ref 0.0–0.5)
Eosinophils Relative: 3 %
HCT: 45.1 % (ref 39.0–52.0)
Hemoglobin: 14.9 g/dL (ref 13.0–17.0)
IMMATURE GRANULOCYTES: 1 %
Lymphocytes Relative: 21 %
Lymphs Abs: 1.6 10*3/uL (ref 0.7–4.0)
MCH: 31.7 pg (ref 26.0–34.0)
MCHC: 33 g/dL (ref 30.0–36.0)
MCV: 96 fL (ref 80.0–100.0)
MONOS PCT: 9 %
Monocytes Absolute: 0.7 10*3/uL (ref 0.1–1.0)
NEUTROS PCT: 65 %
Neutro Abs: 5.2 10*3/uL (ref 1.7–7.7)
Platelets: 202 10*3/uL (ref 150–400)
RBC: 4.7 MIL/uL (ref 4.22–5.81)
RDW: 13.1 % (ref 11.5–15.5)
WBC: 7.8 10*3/uL (ref 4.0–10.5)
nRBC: 0 % (ref 0.0–0.2)

## 2018-06-07 LAB — COMPREHENSIVE METABOLIC PANEL
ALT: 15 U/L (ref 0–44)
AST: 22 U/L (ref 15–41)
Albumin: 3.7 g/dL (ref 3.5–5.0)
Alkaline Phosphatase: 70 U/L (ref 38–126)
Anion gap: 9 (ref 5–15)
BUN: 21 mg/dL (ref 8–23)
CHLORIDE: 102 mmol/L (ref 98–111)
CO2: 26 mmol/L (ref 22–32)
Calcium: 9 mg/dL (ref 8.9–10.3)
Creatinine, Ser: 1.06 mg/dL (ref 0.61–1.24)
GFR calc Af Amer: >60 mL/min (ref >60)
GFR calc non Af Amer: >60 mL/min (ref >60)
Glucose, Bld: 209 mg/dL — ABNORMAL HIGH (ref 70–99)
Potassium: 4.3 mmol/L (ref 3.5–5.1)
Sodium: 137 mmol/L (ref 135–145)
Total Bilirubin: 0.5 mg/dL (ref 0.3–1.2)
Total Protein: 7.1 g/dL (ref 6.5–8.1)

## 2018-06-07 IMAGING — CT CT CERVICAL SPINE W/O CM
5 of 7 series · 15 of 33 positions shown, 16 images · non-contrast
Comparison: Head CT on 10/05/2014

CLINICAL DATA: Fall today.  Head and neck injury and pain.

EXAM:
CT HEAD WITHOUT CONTRAST
CT CERVICAL SPINE WITHOUT CONTRAST
TECHNIQUE: Multidetector CT imaging of the head and cervical spine was
performed following the standard protocol without intravenous
contrast. Multiplanar CT image reconstructions of the cervical spine
were also generated.

[Series 2: head wo · axial · 0.41mm/px · z∈[+591,+643]mm · 2 of 79 slices shown]
[im 27/79  bone]
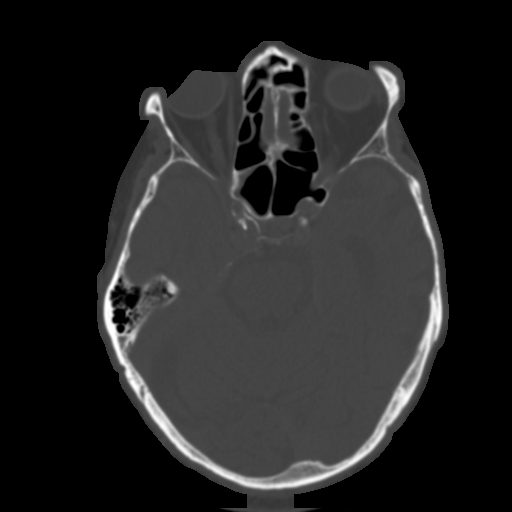
[im 53/79  bone]
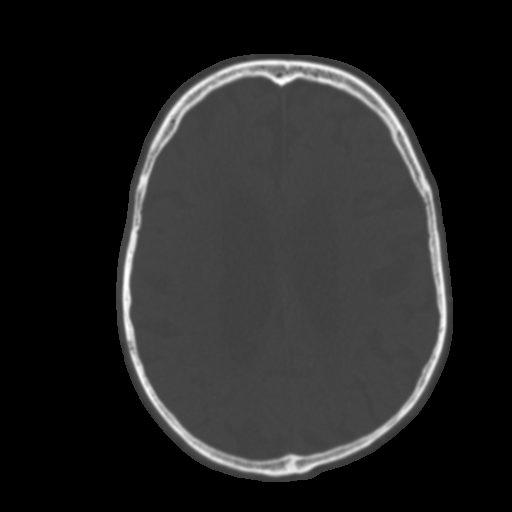

[Series 5: c spine soft · axial · 0.38mm/px · z∈[+456,+504]mm · 2 of 72 slices shown]
[im 24/72  soft-tissue]
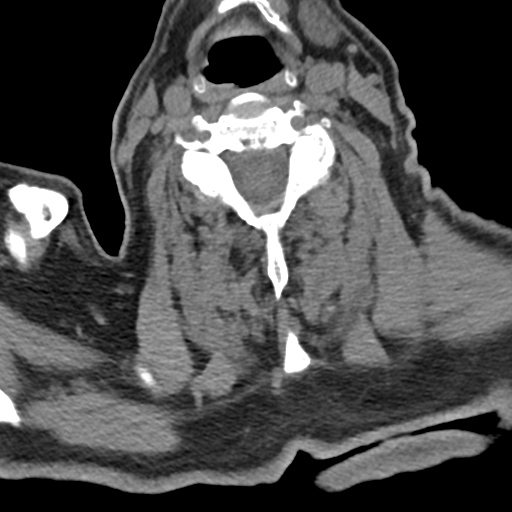
[im 48/72  soft-tissue]
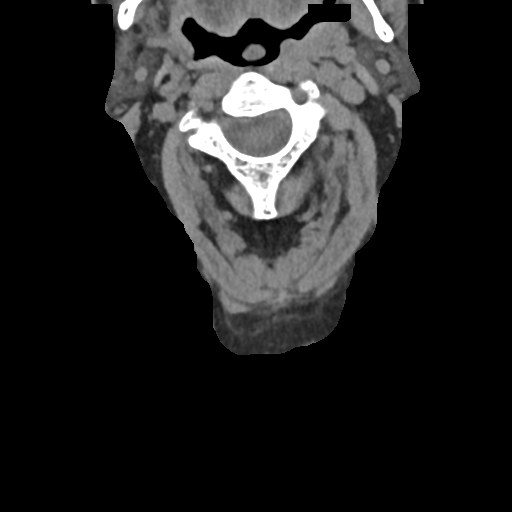

[Series 8: coronal soft tissue · coronal · 0.30mm/px · 3 of 101 slices shown]
[im 6/101  bone]
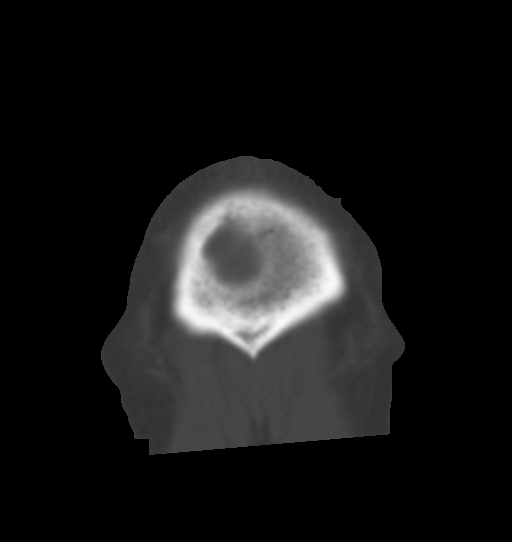
[im 11/101  bone]
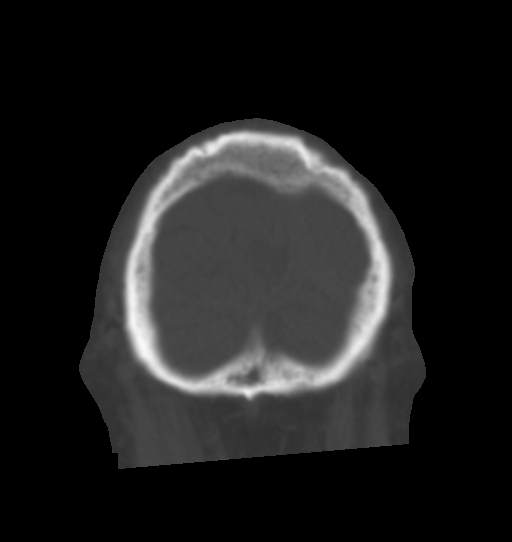
[im 16/101  bone]
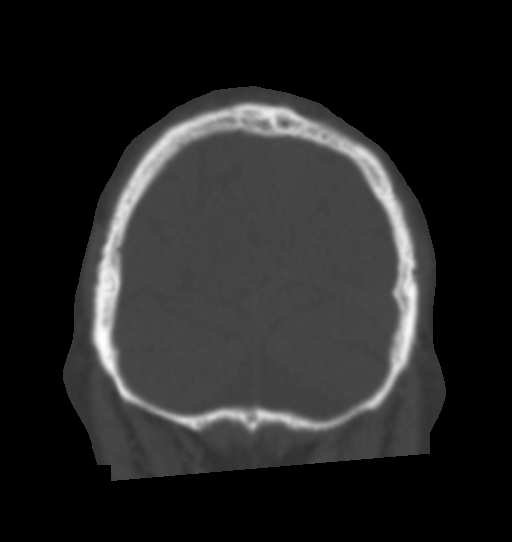

[Series 10: sagittal bone · sagittal · 0.30mm/px · 4 of 61 slices shown]
[im 13/61  bone]
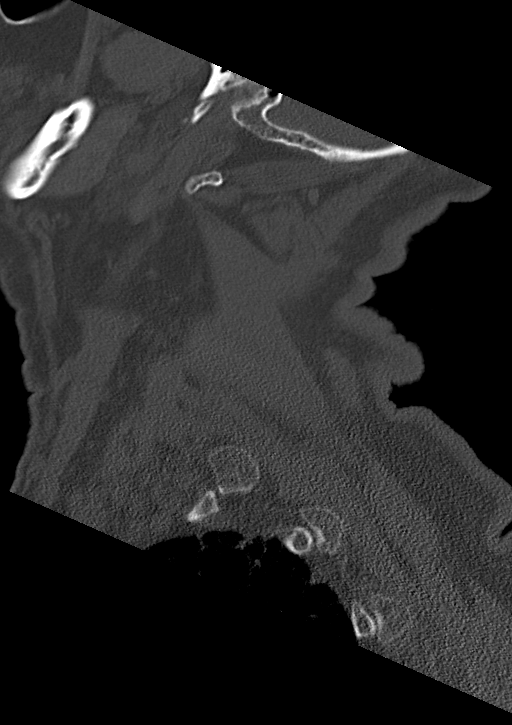
[im 25/61  bone]
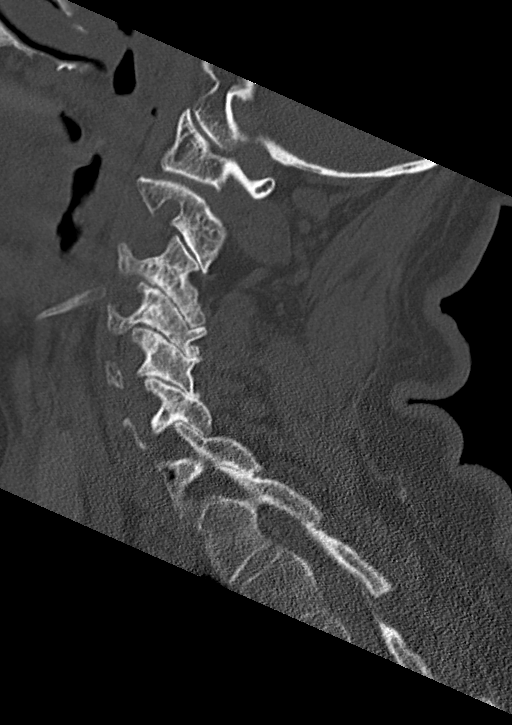
[im 37/61  bone]
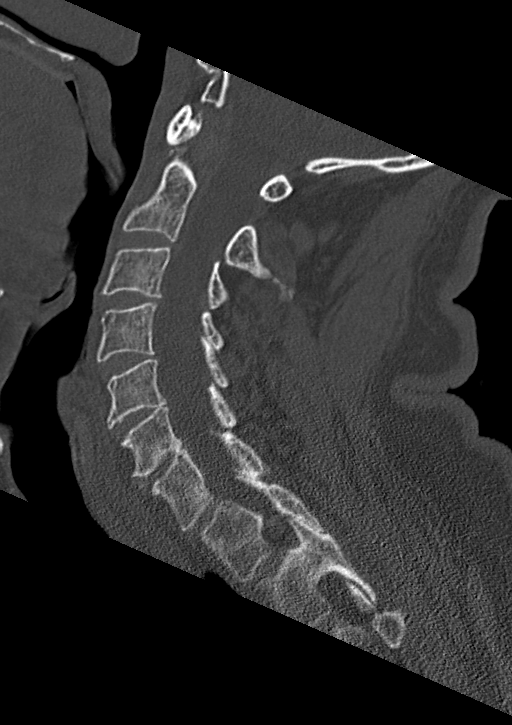
[im 49/61  bone]
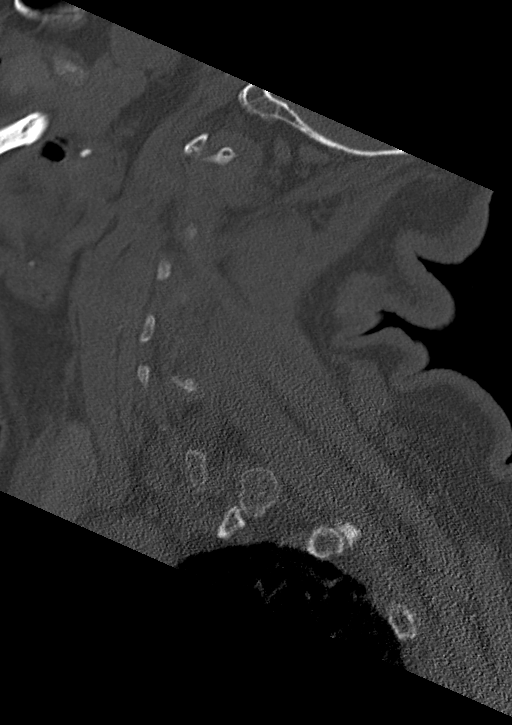

[Series 12: orthogonal bone · axial · 0.23mm/px · z∈[+383,+488]mm · 4 of 106 slices shown, 5 images]
[im 22/106  soft-tissue]
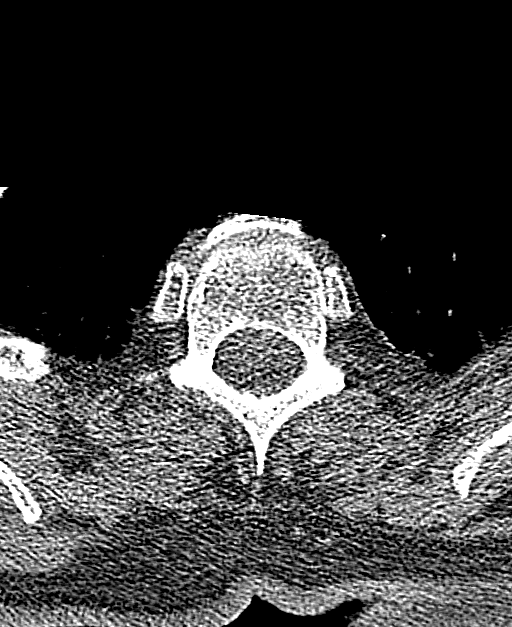
[im 22/106  bone]
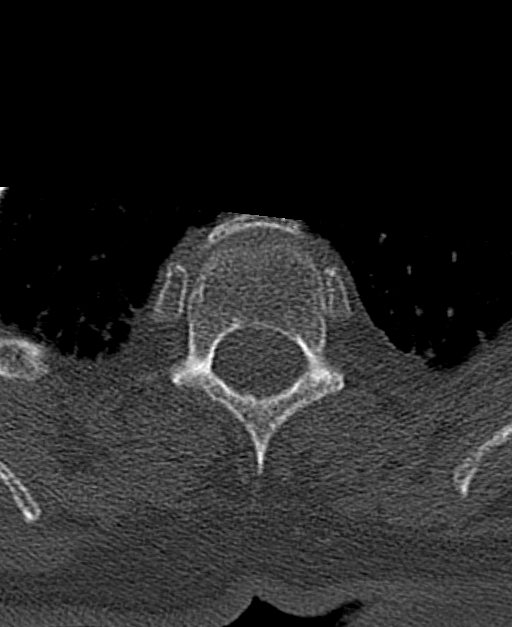
[im 43/106  bone]
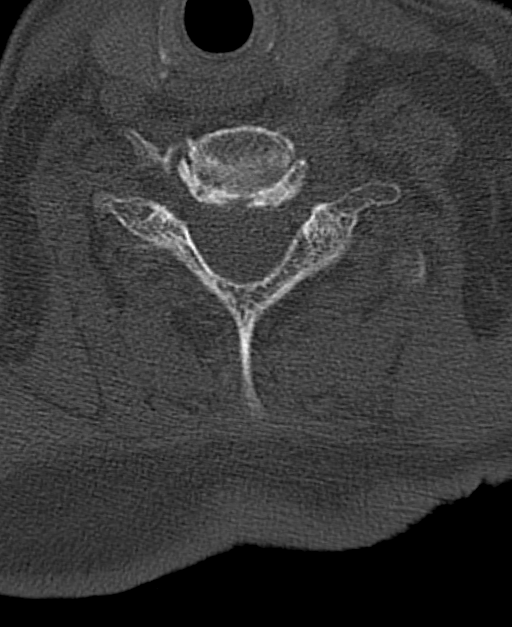
[im 64/106  bone]
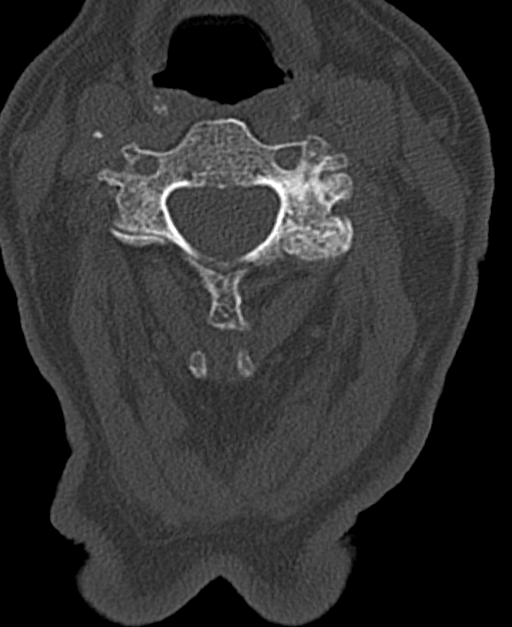
[im 85/106  bone]
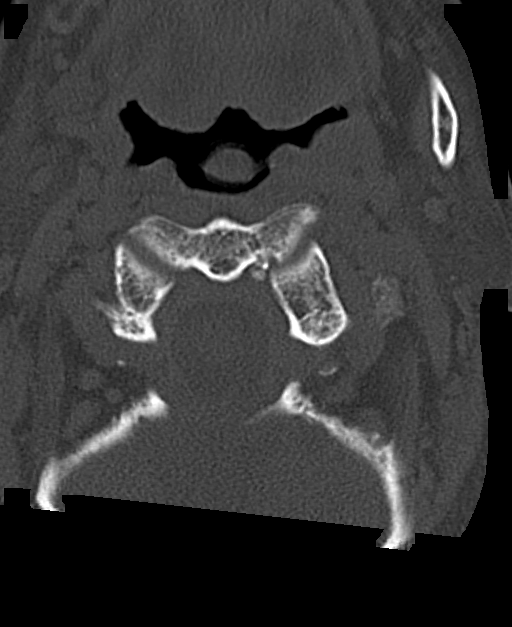

[15 of 33 positions shown; findings below may reference images not displayed]

FINDINGS: CT HEAD FINDINGS

There is no evidence of intracranial hemorrhage, brain edema, or
other signs of acute infarction. There is no evidence of
intracranial mass lesion or mass effect. No abnormal extraaxial
fluid collections are identified.

Moderate diffuse cerebral atrophy and mild chronic small vessel
disease are stable. Ventricles are stable in size. No evidence of
skull fracture or pneumocephalus.

CT CERVICAL SPINE FINDINGS

No evidence of acute fracture, subluxation, or prevertebral soft
tissue swelling.

Mild degenerative disc disease seen at levels of C5-6 and C6-7.
Moderate to severe facet DJD seen mainly on the left from levels of
C3 -C6.
IMPRESSION: No acute intracranial abnormality. Stable moderate cerebral atrophy
and mild chronic small vessel disease.

No evidence of acute cervical spine fracture or subluxation.
Degenerative spondylosis, as described above.

## 2018-06-07 IMAGING — CR DG KNEE 1-2V*L*
2 series · 2 of 2 positions shown · non-contrast
Comparison: None.

CLINICAL DATA: Pain following fall

EXAM:
LEFT KNEE - 1-2 VIEW

[knee ap]
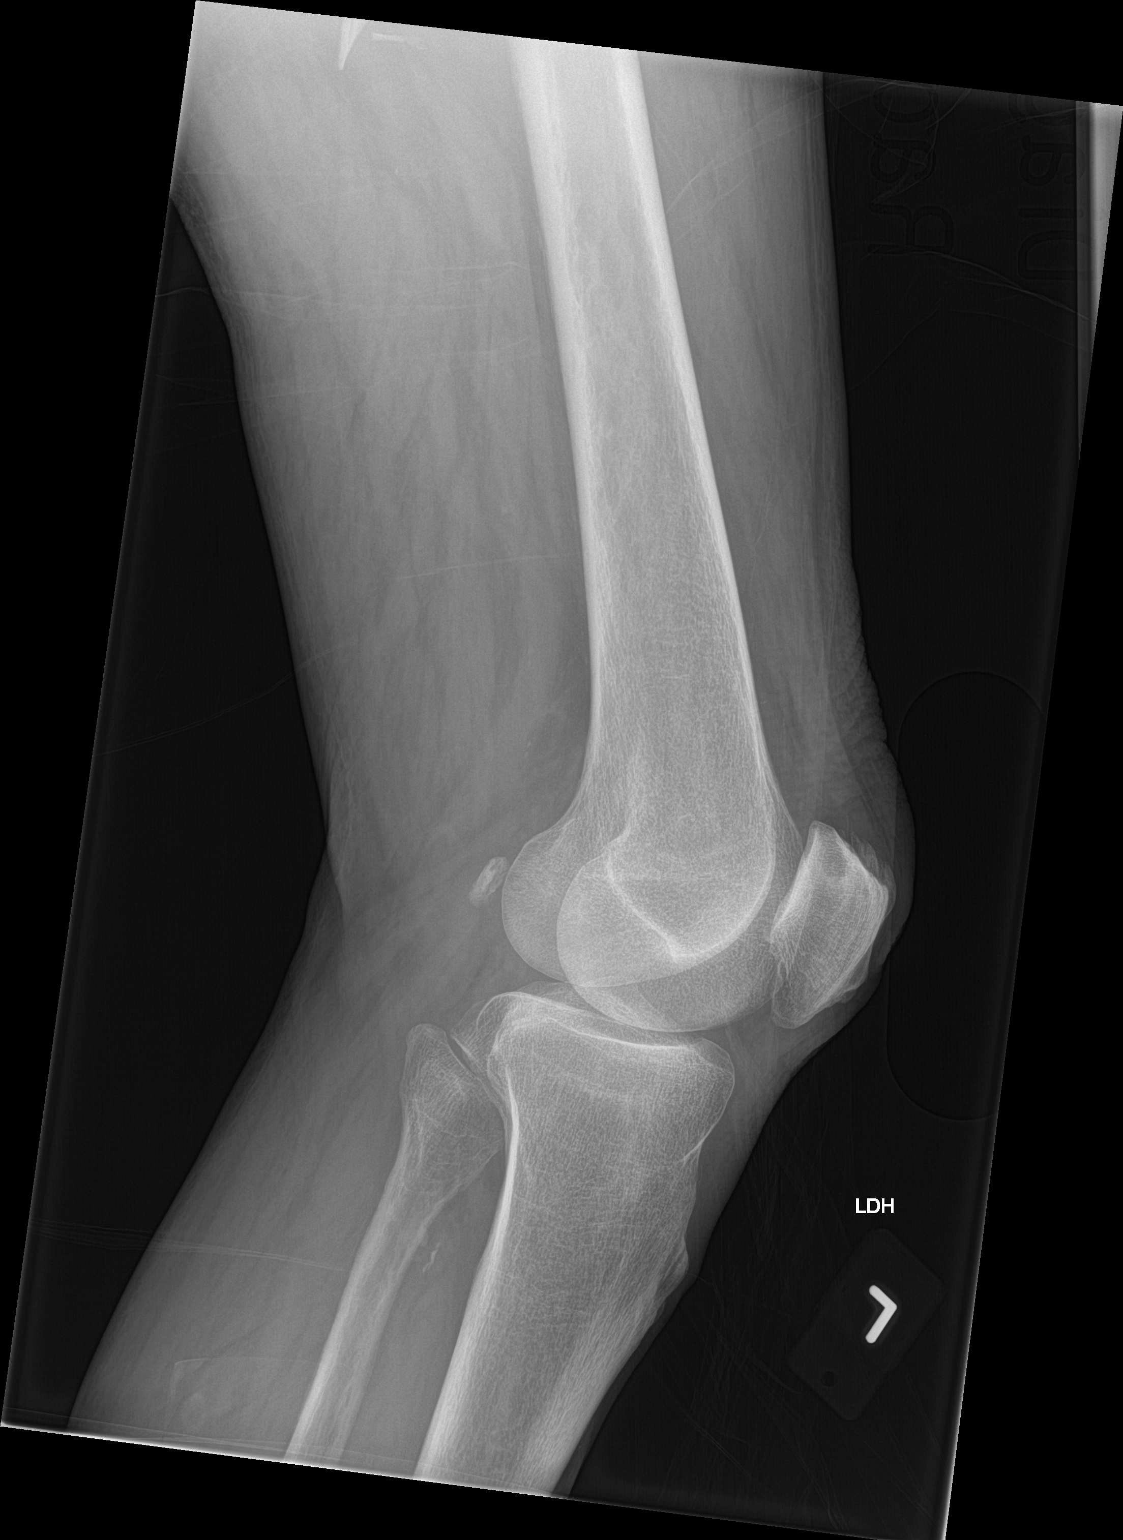

[knee lat]
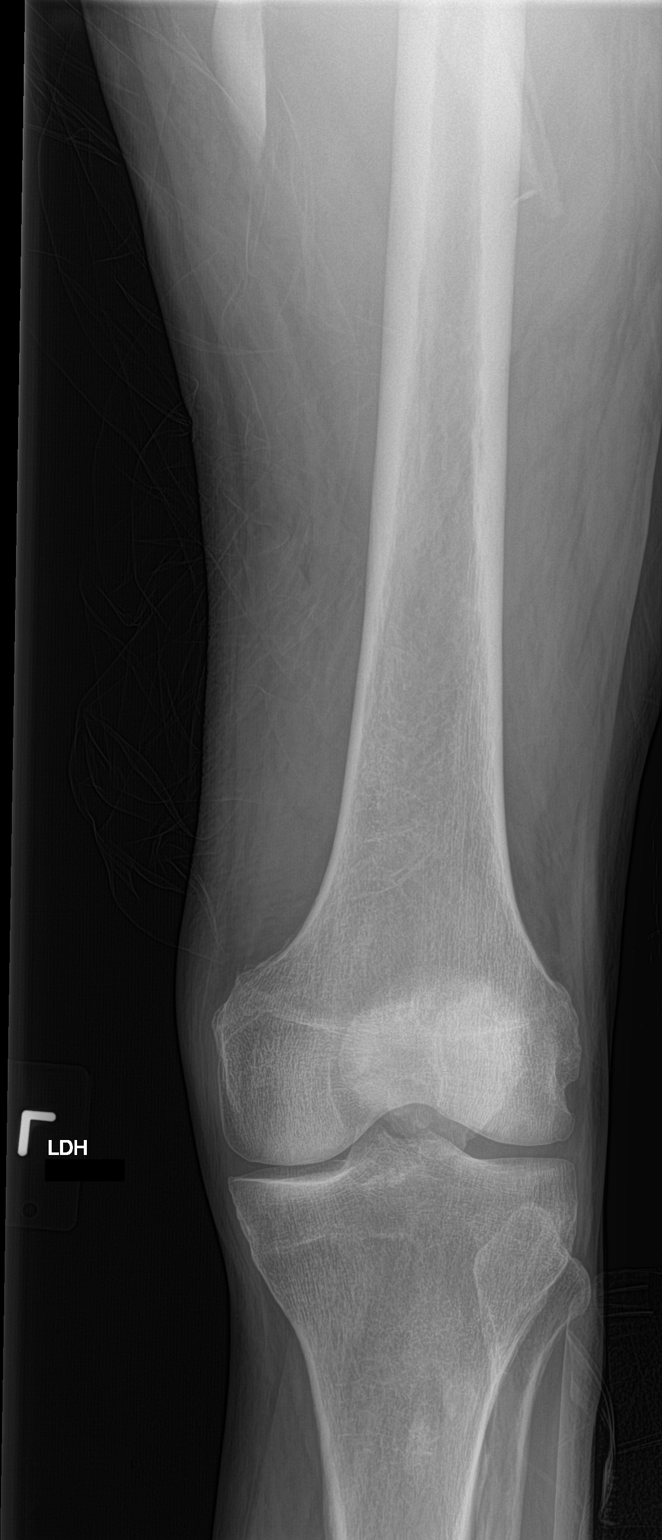

[2 of 2 positions shown; findings below may reference images not displayed]

FINDINGS: Frontal and lateral views were obtained. There is no acute fracture
or dislocation. No joint effusion. There is narrowing medially and
in the patellofemoral joint region. Spurs arise from the anterior
patella. No erosive change.
IMPRESSION: Areas of osteoarthritic change.  No fracture or joint effusion.

## 2018-06-07 NOTE — Progress Notes (Signed)
[place] OFFICE PROGRESS NOTE  Patient Care Team: Albina Billet, MD as PCP - General (Internal Medicine) Royston Cowper, MD as Consulting Physician (Urology)  Cancer Staging Cancer of overlapping sites of bladder [facility]) Staging form: Urinary Bladder, AJCC 7th Edition - Clinical: No stage assigned - Unsigned    Oncology History   1 7 had hematuria followed by cystoscopy on May 3, 2016the scan and cystoscopy indicated 2 x 2 centimeter papillary bladder tumor on the right side of the posterior wall biopsy and urine cytology was positive for transitional cell carcinoma.  Tumor was muscle invasion with lymphovascular invasion Patient  had a transurethral resection of bladder tumor (Oct 24, 2014) 3.patient will start cis-platinum and radiation therapy from June of 2016 4.  Patient has finished total 6 cycles of chemotherapy with cis-platinum on July s/p RT finished on August 4 of 2016.  # last cystoscopy- Dr.Wolfe/Urology- Nov 2018 NED as per pt.   DIAGNOSIS: bladder cancer  STAGE:  II       ;GOALS: ? cure  CURRENT/MOST RECENT THERAPY: surveillance         Cancer of overlapping sites of bladder [facility])      INTERVAL HISTORY:  Herbert Phillips 84 y.o.  male pleasant patient above history of Stage II bladder cancer underwent bladder sparing definitive chemoradiation is here for follow-up.   Patient denies any blood in urine.  Denies any dysuria.  Denies abdominal pain but no weight loss.  No bone pain.  Is fairly active for his age.  He is currently distraught given loss of his son to lung cancer.  Review of Systems  Constitutional: Negative for chills, diaphoresis, fever, malaise/fatigue and weight loss.  HENT: Negative for nosebleeds and sore throat.   Eyes: Negative for double vision.  Respiratory: Negative for cough, hemoptysis, sputum production, shortness of breath and wheezing.   Cardiovascular: Negative for chest pain, palpitations, orthopnea and  leg swelling.  Gastrointestinal: Negative for abdominal pain, blood in stool, constipation, diarrhea, heartburn, melena, nausea and vomiting.  Genitourinary: Negative for dysuria, frequency and urgency.  Musculoskeletal: Negative for back pain and joint pain.  Skin: Negative.  Negative for itching and rash.  Neurological: Negative for dizziness, tingling, focal weakness, weakness and headaches.  Endo/Heme/Allergies: Does not bruise/bleed easily.  Psychiatric/Behavioral: Negative for depression. The patient is not nervous/anxious and does not have insomnia.      PAST MEDICAL HISTORY :  Past Medical History:  Diagnosis Date  . Arthritis   . B12 deficiency   . Bladder cancer ([place])    2 yrs ago  . Dysrhythmia    Bradycardia  . Hip fracture, left ([place]) 02/04/2016  . Hypertension   . Non-insulin dependent type 2 diabetes mellitus ([place])   . Sleep apnea     PAST SURGICAL HISTORY :   Past Surgical History:  Procedure Laterality Date  . cyst on spine  1961  . EAR CYST EXCISION    . INTRAMEDULLARY (IM) NAIL INTERTROCHANTERIC Left 02/04/2016   Procedure: INTRAMEDULLARY (IM) NAIL INTERTROCHANTRIC;  Surgeon: Corky Mull, MD;  Location: [facility] ORS;  Service: Orthopedics;  Laterality: Left;  . TRANSURETHRAL RESECTION OF BLADDER TUMOR N/A 10/24/2014   Procedure: TRANSURETHRAL RESECTION OF BLADDER TUMOR (TURBT);  Surgeon: Royston Cowper, MD;  Location: [facility] ORS;  Service: Urology;  Laterality: N/A;    FAMILY HISTORY :   Family History  Problem Relation Age of Onset  . Diabetes Neg Hx     SOCIAL HISTORY:  Social History   Tobacco Use  . Smoking status: Former Smoker    Last attempt to quit: 06/13/1976    Years since quitting: 42.0  . Smokeless tobacco: Never Used  Substance Use Topics  . Alcohol use: Yes    Comment: monthly 3 beers since xmas  . Drug use: No    ALLERGIES:  has No Known Allergies.  MEDICATIONS:  Current Outpatient Medications  Medication Sig Dispense Refill  .  allopurinol (ZYLOPRIM) 100 MG tablet Take 100 mg by mouth daily.  3  . aspirin EC 81 MG tablet Take 81 mg by mouth daily.     . bacitracin ointment Apply topically 2 (two) times daily. 120 g 0  . Cholecalciferol (VITAMIN D PO) Take by mouth.    . diphenhydrAMINE (BENADRYL) 12.5 MG/5ML elixir Take 5-10 mLs (12.5-25 mg total) by mouth every 4 (four) hours as needed for itching. 120 mL 0  . docusate sodium (COLACE) 100 MG capsule Take 1 capsule (100 mg total) by mouth 2 (two) times daily. 10 capsule 0  . FREESTYLE LITE test strip USE 1 STRIP TO CHECK GLUCOSE TWICE DAILY  5  . furosemide (LASIX) 20 MG tablet Take 20 mg by mouth as needed.     . Glucosamine Sulfate (GLUCOSAMINE RELIEF) 1000 MG TABS Take 1,000 mg by mouth daily.     Marland Kitchen glyBURIDE (DIABETA) 5 MG tablet Take 5 mg by mouth daily with breakfast. Take Tues and Thurs    . lovastatin (MEVACOR) 20 MG tablet Take 20 mg by mouth daily at 6 PM.     . metFORMIN (GLUCOPHAGE) 500 MG tablet Take 500 mg by mouth 2 (two) times daily with a meal.     . Nutritional Supplements (BLADDER 2.2) TABS Take by mouth.    . ramipril (ALTACE) 2.5 MG capsule Take 2.5 mg by mouth daily.    . traZODone (DESYREL) 50 MG tablet Take by mouth.    . vitamin B-12 (CYANOCOBALAMIN) 1000 MCG tablet Take 1,000 mcg by mouth daily.     No current facility-administered medications for this visit.     PHYSICAL EXAMINATION: ECOG PERFORMANCE STATUS: 0 - Asymptomatic  BP 119/67   Pulse 76   Temp 98.1 F (36.7 C) (Oral)   Resp 18   There were no vitals filed for this visit.  Physical Exam  Constitutional: He is oriented to person, place, and time and well-developed, well-nourished, and in no distress.  Accompanied by his daughter-in-law.  HENT:  Head: Normocephalic and atraumatic.  Mouth/Throat: Oropharynx is clear and moist. No oropharyngeal exudate.  Eyes: Pupils are equal, round, and reactive to light.  Neck: Normal range of motion. Neck supple.  Cardiovascular:  Normal rate and regular rhythm.  Pulmonary/Chest: No respiratory distress. He has no wheezes.  Abdominal: Soft. Bowel sounds are normal. He exhibits no distension and no mass. There is no abdominal tenderness. There is no rebound and no guarding.  Musculoskeletal: Normal range of motion.        General: No tenderness or edema.  Neurological: He is alert and oriented to person, place, and time.  Skin: Skin is warm.  Psychiatric: Affect normal.     LABORATORY DATA:  I have reviewed the data as listed    Component Value Date/Time   NA 137 06/07/2018 1354   NA 141 10/06/2014 0451   K 4.3 06/07/2018 1354   K 3.6 10/06/2014 0451   CL 102 06/07/2018 1354   CL 109 10/06/2014 0451   CO2  26 06/07/2018 1354   CO2 28 10/06/2014 0451   GLUCOSE 209 (H) 06/07/2018 1354   GLUCOSE 147 (H) 10/06/2014 0451   BUN 21 06/07/2018 1354   BUN 25 (H) 10/06/2014 0451   CREATININE 1.06 06/07/2018 1354   CREATININE 0.95 10/06/2014 0451   CALCIUM 9.0 06/07/2018 1354   CALCIUM 8.4 (L) 10/06/2014 0451   PROT 7.1 06/07/2018 1354   PROT 7.7 10/05/2014 1526   ALBUMIN 3.7 06/07/2018 1354   ALBUMIN 3.7 10/05/2014 1526   AST 22 06/07/2018 1354   AST 19 10/05/2014 1526   ALT 15 06/07/2018 1354   ALT 13 (L) 10/05/2014 1526   ALKPHOS 70 06/07/2018 1354   ALKPHOS 51 10/05/2014 1526   BILITOT 0.5 06/07/2018 1354   BILITOT 0.7 10/05/2014 1526   GFRNONAA >60 06/07/2018 1354   GFRNONAA >60 10/06/2014 0451   GFRAA >60 06/07/2018 1354   GFRAA >60 10/06/2014 0451    No results found for: SPEP, UPEP  Lab Results  Component Value Date   WBC 7.8 06/07/2018   NEUTROABS 5.2 06/07/2018   HGB 14.9 06/07/2018   HCT 45.1 06/07/2018   MCV 96.0 06/07/2018   PLT 202 06/07/2018      Chemistry      Component Value Date/Time   NA 137 06/07/2018 1354   NA 141 10/06/2014 0451   K 4.3 06/07/2018 1354   K 3.6 10/06/2014 0451   CL 102 06/07/2018 1354   CL 109 10/06/2014 0451   CO2 26 06/07/2018 1354   CO2 28  10/06/2014 0451   BUN 21 06/07/2018 1354   BUN 25 (H) 10/06/2014 0451   CREATININE 1.06 06/07/2018 1354   CREATININE 0.95 10/06/2014 0451      Component Value Date/Time   CALCIUM 9.0 06/07/2018 1354   CALCIUM 8.4 (L) 10/06/2014 0451   ALKPHOS 70 06/07/2018 1354   ALKPHOS 51 10/05/2014 1526   AST 22 06/07/2018 1354   AST 19 10/05/2014 1526   ALT 15 06/07/2018 1354   ALT 13 (L) 10/05/2014 1526   BILITOT 0.5 06/07/2018 1354   BILITOT 0.7 10/05/2014 1526       RADIOGRAPHIC STUDIES: I have personally reviewed the radiological images as listed and agreed with the findings in the report. No results found.   ASSESSMENT & PLAN:  Cancer of overlapping sites of bladder [facility]) Bladder cancer stage- stage II; s/p chemo-RT last cystoscopy- May 2019 by Dr. Rogers Blocker.  December 2019 CT scan shows no evidence of any recurrent disease.  Stable  #Clinically stable.discussed the risk of recurrence is usually high in the first 2 to 3 years.  Hence continue surveillance, however switch over to yearly scans. Awaiting repeat cystoscopy in February 2020.    # DISPOSITION: # Follow up in 12 months; labs- cbc/cmp; CT a/p prior- Dr.B  # 25 minutes face-to-face with the patient discussing the above plan of care; more than 50% of time spent on prognosis/ natural history; counseling and coordination.   Orders Placed This Encounter  Procedures  . CT Abdomen Pelvis W Contrast    Standing Status:   Future    Standing Expiration Date:   12/07/2019    Order Specific Question:   ** REASON FOR EXAM (FREE TEXT)    Answer:   bladder cancer    Order Specific Question:   If indicated for the ordered procedure, I authorize the administration of contrast media per Radiology protocol    Answer:   Yes    Order Specific Question:  Preferred imaging location?    Answer:   [facility]    Order Specific Question:   Is Oral Contrast requested for this exam?    Answer:   Yes, Per Radiology protocol    Order Specific  Question:   Radiology Contrast Protocol - do NOT remove file path    Answer:   [email or web address]   All questions were answered. The patient knows to call the clinic with any problems, questions or concerns.      Cammie Sickle, MD 06/07/2018 3:19 PM

## 2018-06-07 NOTE — Assessment & Plan Note (Addendum)
Bladder cancer stage- stage II; s/p chemo-RT last cystoscopy- May 2019 by Dr. Rogers Blocker.  December 2019 CT scan shows no evidence of any recurrent disease.  Stable  #Clinically stable.discussed the risk of recurrence is usually high in the first 2 to 3 years.  Hence continue surveillance, however switch over to yearly scans. Awaiting repeat cystoscopy in February 2020.    # DISPOSITION: # Follow up in 12 months; labs- cbc/cmp; CT a/p prior- Dr.B  # 25 minutes face-to-face with the patient discussing the above plan of care; more than 50% of time spent on prognosis/ natural history; counseling and coordination.

## 2018-07-19 ENCOUNTER — Ambulatory Visit: Payer: Medicare Other | Admitting: Podiatry

## 2018-07-26 ENCOUNTER — Encounter: Payer: Self-pay | Admitting: Podiatry

## 2018-07-26 ENCOUNTER — Ambulatory Visit (INDEPENDENT_AMBULATORY_CARE_PROVIDER_SITE_OTHER): Payer: Medicare Other | Admitting: Podiatry

## 2018-07-26 DIAGNOSIS — B351 Tinea unguium: Secondary | ICD-10-CM

## 2018-07-26 DIAGNOSIS — M79676 Pain in unspecified toe(s): Secondary | ICD-10-CM | POA: Diagnosis not present

## 2018-07-26 DIAGNOSIS — Q828 Other specified congenital malformations of skin: Secondary | ICD-10-CM | POA: Diagnosis not present

## 2018-07-26 DIAGNOSIS — E1142 Type 2 diabetes mellitus with diabetic polyneuropathy: Secondary | ICD-10-CM

## 2018-07-26 NOTE — Progress Notes (Signed)
He presents today chief complaint of painful elongated toenails and calluses.  Objective: Toenails are long thick yellow dystrophic and mycotic.  Reactive hyper keratomas plantar aspect of the forefoot bilateral.  Assessment: Pain in limb secondary onychomycosis and porokeratosis.  Plan: Debridement of toenails and reactive hyperkeratotic tissue today.

## 2018-08-19 ENCOUNTER — Encounter: Payer: Self-pay | Admitting: Radiation Oncology

## 2018-08-19 ENCOUNTER — Other Ambulatory Visit: Payer: Self-pay

## 2018-08-19 ENCOUNTER — Ambulatory Visit
Admission: RE | Admit: 2018-08-19 | Discharge: 2018-08-19 | Disposition: A | Discharge status: Home / Self Care | Payer: Medicare Other | Source: Ambulatory Visit | Attending: Radiation Oncology | Admitting: Radiation Oncology

## 2018-08-19 VITALS — BP 126/66 | HR 66 | Resp 18 | Wt 179.9 lb

## 2018-08-19 DIAGNOSIS — Z8551 Personal history of malignant neoplasm of bladder: Secondary | ICD-10-CM | POA: Insufficient documentation

## 2018-08-19 DIAGNOSIS — Z923 Personal history of irradiation: Secondary | ICD-10-CM | POA: Insufficient documentation

## 2018-08-19 DIAGNOSIS — Z9221 Personal history of antineoplastic chemotherapy: Secondary | ICD-10-CM | POA: Diagnosis not present

## 2018-08-19 DIAGNOSIS — C679 Malignant neoplasm of bladder, unspecified: Secondary | ICD-10-CM

## 2018-08-19 NOTE — Progress Notes (Signed)
Radiation Oncology Follow up Note  Name: Herbert Phillips   Date:   08/19/2018 MRN:  [id number] DOB: 03/22/1926    This 85 y.o. male presents to the clinic today for 3-1/2 year follow-up status post ration therapy for transitional cell carcinoma the bladder.  REFERRING PROVIDER: Albina Billet, MD  HPI: patient is an 85 year old male now out 3 years having completed radiation therapy for at least stage II transitional cell carcinoma the bladder status post chemotherapy and radiation. Seen today in routine follow-up he is doing well. He specifically denies urinary frequency urgency has nocturia 2. No hematuria. Recently had a CT scan.showing similar appearance to prior examination without evidence of metastatic disease.  COMPLICATIONS OF TREATMENT: none  FOLLOW UP COMPLIANCE: keeps appointments   PHYSICAL EXAM:  BP 126/66 (BP Location: Left Arm, Patient Position: Sitting)   Pulse 66   Resp 18   Wt 179 lb 14.3 oz (81.6 kg)   BMI 25.81 kg/m  Well-developed well-nourished patient in NAD. HEENT reveals PERLA, EOMI, discs not visualized.  Oral cavity is clear. No oral mucosal lesions are identified. Neck is clear without evidence of cervical or supraclavicular adenopathy. Lungs are clear to A&P. Cardiac examination is essentially unremarkable with regular rate and rhythm without murmur rub or thrill. Abdomen is benign with no organomegaly or masses noted. Motor sensory and DTR levels are equal and symmetric in the upper and lower extremities. Cranial nerves II through XII are grossly intact. Proprioception is intact. No peripheral adenopathy or edema is identified. No motor or sensory levels are noted. Crude visual fields are within normal range.  RADIOLOGY RESULTS: CT scan reviewed and compatible with the above-stated findings  PLAN: present time patient continues to do well clinically stable with no evidence of disease. I am please was overall progress. Based on his advanced age I'll  turn follow-up care over to medical oncology. I would be happy to reevaluate patient any time should further palliative treatment be indicated.  I would like to take this opportunity to thank you for allowing me to participate in the care of your patient.Noreene Filbert, MD

## 2018-11-01 ENCOUNTER — Ambulatory Visit (INDEPENDENT_AMBULATORY_CARE_PROVIDER_SITE_OTHER): Payer: Medicare Other | Admitting: Podiatry

## 2018-11-01 ENCOUNTER — Encounter: Payer: Self-pay | Admitting: Podiatry

## 2018-11-01 ENCOUNTER — Other Ambulatory Visit: Payer: Self-pay

## 2018-11-01 VITALS — Temp 97.3°F

## 2018-11-01 DIAGNOSIS — M79676 Pain in unspecified toe(s): Secondary | ICD-10-CM

## 2018-11-01 DIAGNOSIS — E1142 Type 2 diabetes mellitus with diabetic polyneuropathy: Secondary | ICD-10-CM

## 2018-11-01 DIAGNOSIS — B351 Tinea unguium: Secondary | ICD-10-CM | POA: Diagnosis not present

## 2018-11-01 DIAGNOSIS — Q828 Other specified congenital malformations of skin: Secondary | ICD-10-CM | POA: Diagnosis not present

## 2018-11-01 NOTE — Progress Notes (Signed)
He presents today chief complaint of painful elongated toenails and calluses bilaterally.  Objective: Pulses are barely palpable toenails are long thick yellow dystrophic-like mycotic hammertoe deformities resulting in multiple porokeratotic lesions not only to the toes but to the plantar aspect of the forefoot as well.  No open lesions or wounds are noted.  Toenails are long thick yellow dystrophic-like mycotic and painful.  Assessment: Pain limb secondary onychomycosis hammertoe deformities bilateral.  Reactive hyperkeratotic tissue forefoot.  Plan: Debridement of tissues and debridement of all reactive hyperkeratotic tissue and nails.

## 2019-02-02 ENCOUNTER — Ambulatory Visit (INDEPENDENT_AMBULATORY_CARE_PROVIDER_SITE_OTHER): Payer: Medicare Other | Admitting: Podiatry

## 2019-02-02 ENCOUNTER — Other Ambulatory Visit: Payer: Self-pay

## 2019-02-02 ENCOUNTER — Encounter: Payer: Self-pay | Admitting: Podiatry

## 2019-02-02 DIAGNOSIS — B351 Tinea unguium: Secondary | ICD-10-CM | POA: Diagnosis not present

## 2019-02-02 DIAGNOSIS — Q828 Other specified congenital malformations of skin: Secondary | ICD-10-CM | POA: Diagnosis not present

## 2019-02-02 DIAGNOSIS — E1142 Type 2 diabetes mellitus with diabetic polyneuropathy: Secondary | ICD-10-CM | POA: Diagnosis not present

## 2019-02-02 DIAGNOSIS — M79676 Pain in unspecified toe(s): Secondary | ICD-10-CM | POA: Diagnosis not present

## 2019-02-02 NOTE — Progress Notes (Signed)
He presents today chief complaint of painful elongated toenails as well as corns and calluses.  Objective: Vital signs are stable he is alert oriented x3 pulses are palpable.  Toenails are long thick yellow dystrophic-like mycotic painful palpation multiple reactive hyper keratomas plantar aspect of the bilateral foot.  Assessment: Pain in limb secondary onychomycosis.  Plan: Debridement of toenails and reactive hyperkeratotic tissue 1 through 5 bilateral.

## 2019-05-04 ENCOUNTER — Ambulatory Visit: Payer: Medicare Other | Admitting: Podiatry

## 2019-05-16 ENCOUNTER — Ambulatory Visit: Payer: Medicare Other | Admitting: Podiatry

## 2019-05-25 ENCOUNTER — Other Ambulatory Visit: Payer: Self-pay

## 2019-05-25 ENCOUNTER — Ambulatory Visit (INDEPENDENT_AMBULATORY_CARE_PROVIDER_SITE_OTHER): Payer: Medicare Other | Admitting: Podiatry

## 2019-05-25 DIAGNOSIS — M79676 Pain in unspecified toe(s): Secondary | ICD-10-CM

## 2019-05-25 DIAGNOSIS — B351 Tinea unguium: Secondary | ICD-10-CM | POA: Diagnosis not present

## 2019-05-25 DIAGNOSIS — E1142 Type 2 diabetes mellitus with diabetic polyneuropathy: Secondary | ICD-10-CM

## 2019-05-25 NOTE — Progress Notes (Signed)
He presents today chief complaint of painfully elongated toenails 1 through 5 bilaterally.  Objective: Pulses remain palpable no erythema edema cellulitis drainage odor toenails are thick yellow dystrophic-like mycotic painful palpation as well as debridement.  Assessment: Pain in limb secondary onychomycosis.  Plan: Debridement of toenails 1 through 5 bilateral.

## 2019-06-09 ENCOUNTER — Other Ambulatory Visit: Payer: Self-pay

## 2019-06-09 ENCOUNTER — Other Ambulatory Visit: Payer: Self-pay | Admitting: *Deleted

## 2019-06-09 ENCOUNTER — Ambulatory Visit
Admission: RE | Admit: 2019-06-09 | Discharge: 2019-06-09 | Disposition: A | Discharge status: Home / Self Care | Payer: Medicare Other | Source: Ambulatory Visit | Attending: Internal Medicine | Admitting: Internal Medicine

## 2019-06-09 DIAGNOSIS — C678 Malignant neoplasm of overlapping sites of bladder: Secondary | ICD-10-CM

## 2019-06-09 LAB — POCT I-STAT CREATININE: Creatinine, Ser: 0.9 mg/dL (ref 0.61–1.24)

## 2019-06-09 MED ORDER — IOHEXOL 300 MG/ML  SOLN
100.0000 mL | Freq: Once | INTRAMUSCULAR | Status: AC | PRN
  Administered 2019-06-09: 100 mL via INTRAVENOUS

## 2019-06-10 ENCOUNTER — Inpatient Hospital Stay (HOSPITAL_BASED_OUTPATIENT_CLINIC_OR_DEPARTMENT_OTHER): Payer: Medicare Other | Admitting: Internal Medicine

## 2019-06-10 ENCOUNTER — Inpatient Hospital Stay: Payer: Medicare Other | Attending: Internal Medicine

## 2019-06-10 ENCOUNTER — Other Ambulatory Visit: Payer: Self-pay

## 2019-06-10 VITALS — BP 126/61 | HR 69 | Temp 97.8°F | Resp 18 | Wt 166.0 lb

## 2019-06-10 DIAGNOSIS — Z87891 Personal history of nicotine dependence: Secondary | ICD-10-CM | POA: Diagnosis not present

## 2019-06-10 DIAGNOSIS — M129 Arthropathy, unspecified: Secondary | ICD-10-CM | POA: Insufficient documentation

## 2019-06-10 DIAGNOSIS — E119 Type 2 diabetes mellitus without complications: Secondary | ICD-10-CM | POA: Insufficient documentation

## 2019-06-10 DIAGNOSIS — Z923 Personal history of irradiation: Secondary | ICD-10-CM | POA: Diagnosis not present

## 2019-06-10 DIAGNOSIS — R2681 Unsteadiness on feet: Secondary | ICD-10-CM | POA: Diagnosis not present

## 2019-06-10 DIAGNOSIS — E538 Deficiency of other specified B group vitamins: Secondary | ICD-10-CM | POA: Insufficient documentation

## 2019-06-10 DIAGNOSIS — Z79899 Other long term (current) drug therapy: Secondary | ICD-10-CM | POA: Diagnosis not present

## 2019-06-10 DIAGNOSIS — Z9221 Personal history of antineoplastic chemotherapy: Secondary | ICD-10-CM | POA: Insufficient documentation

## 2019-06-10 DIAGNOSIS — I10 Essential (primary) hypertension: Secondary | ICD-10-CM | POA: Insufficient documentation

## 2019-06-10 DIAGNOSIS — C678 Malignant neoplasm of overlapping sites of bladder: Secondary | ICD-10-CM

## 2019-06-10 DIAGNOSIS — Z7982 Long term (current) use of aspirin: Secondary | ICD-10-CM | POA: Insufficient documentation

## 2019-06-10 DIAGNOSIS — G4730 Sleep apnea, unspecified: Secondary | ICD-10-CM | POA: Diagnosis not present

## 2019-06-10 DIAGNOSIS — R000 Tachycardia, unspecified: Secondary | ICD-10-CM | POA: Insufficient documentation

## 2019-06-10 LAB — CBC WITH DIFFERENTIAL/PLATELET
Abs Immature Granulocytes: 0.06 10*3/uL (ref 0.00–0.07)
Basophils Absolute: 0.1 10*3/uL (ref 0.0–0.1)
Basophils Relative: 1 %
Eosinophils Absolute: 0.1 10*3/uL (ref 0.0–0.5)
Eosinophils Relative: 2 %
HCT: 49.3 % (ref 39.0–52.0)
Hemoglobin: 15.8 g/dL (ref 13.0–17.0)
Immature Granulocytes: 1 %
Lymphocytes Relative: 21 %
Lymphs Abs: 1.7 10*3/uL (ref 0.7–4.0)
MCH: 31.6 pg (ref 26.0–34.0)
MCHC: 32 g/dL (ref 30.0–36.0)
MCV: 98.6 fL (ref 80.0–100.0)
Monocytes Absolute: 0.7 10*3/uL (ref 0.1–1.0)
Monocytes Relative: 9 %
Neutro Abs: 5.5 10*3/uL (ref 1.7–7.7)
Neutrophils Relative %: 66 %
Platelets: 218 10*3/uL (ref 150–400)
RBC: 5 MIL/uL (ref 4.22–5.81)
RDW: 13.5 % (ref 11.5–15.5)
WBC: 8.3 10*3/uL (ref 4.0–10.5)
nRBC: 0 % (ref 0.0–0.2)

## 2019-06-10 LAB — COMPREHENSIVE METABOLIC PANEL
ALT: 17 U/L (ref 0–44)
AST: 21 U/L (ref 15–41)
Albumin: 4.1 g/dL (ref 3.5–5.0)
Alkaline Phosphatase: 67 U/L (ref 38–126)
Anion gap: 10 (ref 5–15)
BUN: 22 mg/dL (ref 8–23)
CO2: 28 mmol/L (ref 22–32)
Calcium: 9.4 mg/dL (ref 8.9–10.3)
Chloride: 104 mmol/L (ref 98–111)
Creatinine, Ser: 0.99 mg/dL (ref 0.61–1.24)
GFR calc Af Amer: >60 mL/min (ref >60)
GFR calc non Af Amer: >60 mL/min (ref >60)
Glucose, Bld: 123 mg/dL — ABNORMAL HIGH (ref 70–99)
Potassium: 4.8 mmol/L (ref 3.5–5.1)
Sodium: 142 mmol/L (ref 135–145)
Total Bilirubin: 0.6 mg/dL (ref 0.3–1.2)
Total Protein: 7.6 g/dL (ref 6.5–8.1)

## 2019-06-10 NOTE — Progress Notes (Signed)
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  Tumor was muscle invasion with lymphovascular invasion Patient  had a transurethral resection of bladder tumor (Oct 24, 2014) 3.patient will start cis-platinum and radiation therapy from June of 2016 4.  Patient has finished total 6 cycles of chemotherapy with cis-platinum on July s/p RT finished on August 4 of 2016.  # last cystoscopy- Dr.Wolfe/Urology- Nov 2018 NED as per pt.   DIAGNOSIS: bladder cancer  STAGE:  II       ;GOALS: ? cure  CURRENT/MOST RECENT THERAPY: surveillance       Cancer of overlapping sites of bladder [facility])      INTERVAL HISTORY:  Herbert Phillips 85 y.o.  male pleasant patient above history of Stage II bladder cancer underwent bladder sparing definitive chemoradiation is here for follow-up.   Patient had recent fall; because of unsteady gait hit his head.  Noted to have bleeding.  Otherwise no loss of consciousness or headache.  Patient has not followed up with Dr. Eliberto Ivory urology-given the recent Covid pandemic.  He denies any blood in urine.  Denies any dysuria.  Denies abdominal pain no weight loss.   Review of Systems  Constitutional: Negative for chills, diaphoresis, fever, malaise/fatigue and weight loss.  HENT: Negative for nosebleeds and sore throat.   Eyes: Negative for double vision.  Respiratory: Negative for cough, hemoptysis, sputum  production, shortness of breath and wheezing.   Cardiovascular: Negative for chest pain, palpitations, orthopnea and leg swelling.  Gastrointestinal: Negative for abdominal pain, blood in stool, constipation, diarrhea, heartburn, melena, nausea and vomiting.  Genitourinary: Negative for dysuria, frequency and urgency.  Musculoskeletal: Negative for back pain and joint pain.  Skin: Negative.  Negative for itching and rash.  Neurological: Negative for dizziness, tingling, focal weakness, weakness and headaches.       S/p fall  Endo/Heme/Allergies: Does not bruise/bleed easily.  Psychiatric/Behavioral: Negative for depression. The patient is not nervous/anxious and does not have insomnia.      PAST MEDICAL HISTORY :  Past Medical History:  Diagnosis Date  . Arthritis   . B12 deficiency   . Bladder cancer ([place])    2 yrs ago  . Dysrhythmia    Bradycardia  . Hip fracture, left ([place]) 02/04/2016  . Hypertension   . Non-insulin dependent type 2 diabetes mellitus ([place])   . Sleep apnea     PAST SURGICAL HISTORY :   Past Surgical History:  Procedure Laterality Date  . cyst on spine  1961  . EAR CYST EXCISION    . INTRAMEDULLARY (IM) NAIL INTERTROCHANTERIC Left 02/04/2016   Procedure: INTRAMEDULLARY (IM) NAIL INTERTROCHANTRIC;  Surgeon: Corky Mull, MD;  Location: [facility] ORS;  Service: Orthopedics;  Laterality: Left;  . TRANSURETHRAL RESECTION OF BLADDER TUMOR N/A 10/24/2014   Procedure: TRANSURETHRAL RESECTION OF BLADDER TUMOR (TURBT);  Surgeon: Royston Cowper, MD;  Location: [facility] ORS;  Service: Urology;  Laterality: N/A;    FAMILY HISTORY :  Family History  Problem Relation Age of Onset  . Diabetes Neg Hx     SOCIAL HISTORY:   Social History   Tobacco Use  . Smoking status: Former Smoker    Quit date: 06/13/1976    Years since quitting: 43.0  . Smokeless tobacco: Never Used  Substance Use Topics  . Alcohol use: Yes    Comment: monthly 3 beers since xmas  . Drug use: No     ALLERGIES:  has No Known Allergies.  MEDICATIONS:  Current Outpatient Medications  Medication Sig Dispense Refill  . allopurinol (ZYLOPRIM) 100 MG tablet Take 100 mg by mouth daily.  3  . aspirin EC 81 MG tablet Take 81 mg by mouth daily.     . bacitracin ointment Apply topically 2 (two) times daily. 120 g 0  . bacitracin ophthalmic ointment USE A SMALL AMOUNT IN LEFT EYE AFTER INJECTION AS NEEDED    . Cholecalciferol (VITAMIN D PO) Take by mouth.    . docusate sodium (COLACE) 100 MG capsule Take 1 capsule (100 mg total) by mouth 2 (two) times daily. 10 capsule 0  . FREESTYLE LITE test strip USE 1 STRIP TO CHECK GLUCOSE TWICE DAILY  5  . Glucosamine Sulfate (GLUCOSAMINE RELIEF) 1000 MG TABS Take 1,000 mg by mouth daily.     Marland Kitchen lovastatin (MEVACOR) 20 MG tablet Take 20 mg by mouth daily at 6 PM.     . meclizine (ANTIVERT) 12.5 MG tablet TAKE 1 TABLET BY MOUTH EVERY 8 HOURS AS NEEDED FOR DIZZINESS    . metFORMIN (GLUCOPHAGE) 500 MG tablet Take 500 mg by mouth 2 (two) times daily with a meal.     . Nutritional Supplements (BLADDER 2.2) TABS Take by mouth.    . ramipril (ALTACE) 2.5 MG capsule Take 2.5 mg by mouth daily.    . traZODone (DESYREL) 50 MG tablet Take by mouth.    . vitamin B-12 (CYANOCOBALAMIN) 1000 MCG tablet Take 1,000 mcg by mouth daily.    . XYLIMELTS 500 MG DISK TAKE 1 TABLET BY MOUTH EVERY 8 HOURS AS NEEDED    . diphenhydrAMINE (BENADRYL) 12.5 MG/5ML elixir Take 5-10 mLs (12.5-25 mg total) by mouth every 4 (four) hours as needed for itching. (Patient not taking: Reported on 06/09/2019) 120 mL 0  . furosemide (LASIX) 20 MG tablet Take 20 mg by mouth as needed.      No current facility-administered medications for this visit.    PHYSICAL EXAMINATION: ECOG PERFORMANCE STATUS: 0 - Asymptomatic  BP 126/61 (BP Location: Left Arm, Patient Position: Sitting)   Pulse 69   Temp 97.8 F (36.6 C) (Tympanic)   Resp 18   Wt 166 lb (75.3 kg)   BMI 23.82 kg/m   Filed  Weights   06/10/19 1332  Weight: 166 lb (75.3 kg)    Physical Exam  Constitutional: He is oriented to person, place, and time and well-developed, well-nourished, and in no distress.  Accompanied by his daughter-in-law.  HENT:  Head: Normocephalic and atraumatic.  Mouth/Throat: Oropharynx is clear and moist. No oropharyngeal exudate.  Eyes: Pupils are equal, round, and reactive to light.  Cardiovascular: Normal rate and regular rhythm.  Pulmonary/Chest: No respiratory distress. He has no wheezes.  Abdominal: Soft. Bowel sounds are normal. He exhibits no distension and no mass. There is no abdominal tenderness. There is no rebound and no guarding.  Musculoskeletal:        General: No tenderness or edema. Normal range of motion.  Cervical back: Normal range of motion and neck supple.  Neurological: He is alert and oriented to person, place, and time.  Skin: Skin is warm.  Psychiatric: Affect normal.     LABORATORY DATA:  I have reviewed the data as listed    Component Value Date/Time   NA 142 06/10/2019 1310   NA 141 10/06/2014 0451   K 4.8 06/10/2019 1310   K 3.6 10/06/2014 0451   CL 104 06/10/2019 1310   CL 109 10/06/2014 0451   CO2 28 06/10/2019 1310   CO2 28 10/06/2014 0451   GLUCOSE 123 (H) 06/10/2019 1310   GLUCOSE 147 (H) 10/06/2014 0451   BUN 22 06/10/2019 1310   BUN 25 (H) 10/06/2014 0451   CREATININE 0.99 06/10/2019 1310   CREATININE 0.95 10/06/2014 0451   CALCIUM 9.4 06/10/2019 1310   CALCIUM 8.4 (L) 10/06/2014 0451   PROT 7.6 06/10/2019 1310   PROT 7.7 10/05/2014 1526   ALBUMIN 4.1 06/10/2019 1310   ALBUMIN 3.7 10/05/2014 1526   AST 21 06/10/2019 1310   AST 19 10/05/2014 1526   ALT 17 06/10/2019 1310   ALT 13 (L) 10/05/2014 1526   ALKPHOS 67 06/10/2019 1310   ALKPHOS 51 10/05/2014 1526   BILITOT 0.6 06/10/2019 1310   BILITOT 0.7 10/05/2014 1526   GFRNONAA >60 06/10/2019 1310   GFRNONAA >60 10/06/2014 0451   GFRAA >60 06/10/2019 1310   GFRAA >60  10/06/2014 0451    No results found for: SPEP, UPEP  Lab Results  Component Value Date   WBC 8.3 06/10/2019   NEUTROABS 5.5 06/10/2019   HGB 15.8 06/10/2019   HCT 49.3 06/10/2019   MCV 98.6 06/10/2019   PLT 218 06/10/2019      Chemistry      Component Value Date/Time   NA 142 06/10/2019 1310   NA 141 10/06/2014 0451   K 4.8 06/10/2019 1310   K 3.6 10/06/2014 0451   CL 104 06/10/2019 1310   CL 109 10/06/2014 0451   CO2 28 06/10/2019 1310   CO2 28 10/06/2014 0451   BUN 22 06/10/2019 1310   BUN 25 (H) 10/06/2014 0451   CREATININE 0.99 06/10/2019 1310   CREATININE 0.95 10/06/2014 0451      Component Value Date/Time   CALCIUM 9.4 06/10/2019 1310   CALCIUM 8.4 (L) 10/06/2014 0451   ALKPHOS 67 06/10/2019 1310   ALKPHOS 51 10/05/2014 1526   AST 21 06/10/2019 1310   AST 19 10/05/2014 1526   ALT 17 06/10/2019 1310   ALT 13 (L) 10/05/2014 1526   BILITOT 0.6 06/10/2019 1310   BILITOT 0.7 10/05/2014 1526       RADIOGRAPHIC STUDIES: I have personally reviewed the radiological images as listed and agreed with the findings in the report. CT Abdomen Pelvis W Contrast  Result Date: 06/09/2019 CLINICAL DATA:  Annual follow-up, bladder cancer EXAM: CT ABDOMEN AND PELVIS WITH CONTRAST TECHNIQUE: Multidetector CT imaging of the abdomen and pelvis was performed using the standard protocol following bolus administration of intravenous contrast. CONTRAST:  118mL OMNIPAQUE IOHEXOL 300 MG/ML  SOLN COMPARISON:  06/01/2018 FINDINGS: Lower chest: Minimal basilar atelectasis. No signs of consolidation or evidence of pleural effusion. Signs of coronary artery calcification. No sign of pericardial effusion. Heart is incompletely imaged. Hepatobiliary: Liver biliary tree are normal. Pancreas: Pancreas is unremarkable. Spleen: Spleen is normal. Adrenals/Urinary Tract: Adrenal glands are normal. Mild cortical scarring bilaterally. Excretory delays to the distal ureters not performed though no sign of  upper tract  lesion through the proximal portion of the bilateral ureters on excretory portion of the exam. Small presumed cyst in the upper pole the right kidney is unchanged dating back to 2019. Urinary bladder remains trabeculated without distinct lesion. Bladder not assessed on delayed imaging. Stomach/Bowel: No signs of acute gastrointestinal process. Appendix in the right lower quadrant is normal. Vascular/Lymphatic: Calcified atherosclerotic changes throughout the abdominal aorta with noncalcified plaque as well. No signs of aneurysm. No signs of adenopathy in the upper abdomen or in the retroperitoneum. Reproductive: Prostate is heterogeneous, nonspecific on CT. Other: No abdominal wall hernia or abnormality. No abdominopelvic ascites. Musculoskeletal: No sign of acute bone finding or evidence of destructive bone process. The signs of left femoral nailing with dynamic hip screw crossing left femoral neck unchanged. IMPRESSION: 1. No evidence of recurrent or metastatic disease. Trabeculated urinary bladder as before. 2. Stable presumed cyst in the upper pole of the right kidney. 3. Aortic atherosclerosis. Aortic Atherosclerosis (ICD10-I70.0). Electronically Signed   By: Zetta Bills M.D.   On: 06/09/2019 12:49     ASSESSMENT & PLAN:  Cancer of overlapping sites of bladder [facility] Bladder cancer stage- stage II; s/p chemo-RT last cystoscopy- May 2019 by Dr. Rogers Blocker.  December 2020 CT scan shows no evidence of any recurrent disease.  Stable.  #Discussed that since his cancer was almost 4 years ago-less likely chance of recurrence moving forward.  We will proceed with annual imaging CT scan abdomen pelvis in 1 year from now [facility] [place] the last surveillance imaging].  However recommend patient follow-up with Dr. Eliberto Ivory regarding follow-up cystoscopy as this would be more helpful to diagnose bladder cancer in early stages.  # DISPOSITION: # Follow up in 12 months; labs- cbc/cmp; CT a/p prior- Dr.B  # I  reviewed the blood work- with the patient in detail; also reviewed the imaging independently [as summarized above]; and with the patient in detail.    Orders Placed This Encounter  Procedures  . CT ABDOMEN PELVIS W CONTRAST    Standing Status:   Future    Standing Expiration Date:   12/08/2020    Order Specific Question:   If indicated for the ordered procedure, I authorize the administration of contrast media per Radiology protocol    Answer:   Yes    Order Specific Question:   Preferred imaging location?    Answer:   [facility]    Order Specific Question:   Radiology Contrast Protocol - do NOT remove file path    Answer:   [email or web address]    Order Specific Question:   ** REASON FOR EXAM (FREE TEXT)    Answer:   bladder cancer   All questions were answered. The patient knows to call the clinic with any problems, questions or concerns.      Cammie Sickle, MD 06/10/2019 2:59 PM

## 2019-06-10 NOTE — Assessment & Plan Note (Addendum)
Bladder cancer stage- stage II; s/p chemo-RT last cystoscopy- May 2019 by Dr. Rogers Blocker.  December 2020 CT scan shows no evidence of any recurrent disease.  Stable.  #Discussed that since his cancer was almost 4 years ago-less likely chance of recurrence moving forward.  We will proceed with annual imaging CT scan abdomen pelvis in 1 year from now [facility] the last surveillance imaging].  However recommend patient follow-up with Dr. Eliberto Ivory regarding follow-up cystoscopy as this would be more helpful to diagnose bladder cancer in early stages.  # DISPOSITION: # Follow up in 12 months; labs- cbc/cmp; CT a/p prior- Dr.B  # I reviewed the blood work- with the patient in detail; also reviewed the imaging independently [as summarized above]; and with the patient in detail.

## 2019-08-24 ENCOUNTER — Ambulatory Visit: Payer: Medicare Other | Admitting: Podiatry

## 2019-08-31 ENCOUNTER — Other Ambulatory Visit: Payer: Self-pay

## 2019-08-31 ENCOUNTER — Encounter: Payer: Self-pay | Admitting: Podiatry

## 2019-08-31 ENCOUNTER — Ambulatory Visit (INDEPENDENT_AMBULATORY_CARE_PROVIDER_SITE_OTHER): Payer: Medicare Other | Admitting: Podiatry

## 2019-08-31 VITALS — Temp 97.8°F

## 2019-08-31 DIAGNOSIS — B351 Tinea unguium: Secondary | ICD-10-CM

## 2019-08-31 DIAGNOSIS — M79676 Pain in unspecified toe(s): Secondary | ICD-10-CM

## 2019-08-31 DIAGNOSIS — E1142 Type 2 diabetes mellitus with diabetic polyneuropathy: Secondary | ICD-10-CM | POA: Diagnosis not present

## 2019-08-31 DIAGNOSIS — Q828 Other specified congenital malformations of skin: Secondary | ICD-10-CM

## 2019-08-31 NOTE — Progress Notes (Signed)
He presents today chief complaint of painfully elongated toenails.  Objective: Toenails are thick yellow dystrophic-like mycotic.  Pulses are barely palpable no open lesions or wounds are noted.  Assessment: Diabetic peripheral neuropathy.  Pain in limb secondary to onychomycosis.  Plan: Debridement of all reactive hyperkeratotic tissue debridement of onychomycosis bilateral.

## 2019-12-07 ENCOUNTER — Ambulatory Visit: Payer: Medicare Other | Admitting: Podiatry

## 2020-01-11 ENCOUNTER — Other Ambulatory Visit: Payer: Self-pay

## 2020-01-11 ENCOUNTER — Encounter: Payer: Self-pay | Admitting: Podiatry

## 2020-01-11 ENCOUNTER — Ambulatory Visit (INDEPENDENT_AMBULATORY_CARE_PROVIDER_SITE_OTHER): Payer: Medicare Other | Admitting: Podiatry

## 2020-01-11 DIAGNOSIS — Q828 Other specified congenital malformations of skin: Secondary | ICD-10-CM | POA: Diagnosis not present

## 2020-01-11 DIAGNOSIS — E1142 Type 2 diabetes mellitus with diabetic polyneuropathy: Secondary | ICD-10-CM | POA: Diagnosis not present

## 2020-01-11 DIAGNOSIS — B351 Tinea unguium: Secondary | ICD-10-CM

## 2020-01-11 DIAGNOSIS — M79676 Pain in unspecified toe(s): Secondary | ICD-10-CM

## 2020-01-11 NOTE — Progress Notes (Signed)
He presents today for follow-up of his painful elongated toenails.  Otherwise no complaints.  Objective: Toenails are long thick yellow dystrophic clinically mycotic painful palpation.  Pulses are palpable no open lesions or wounds are noted.  Assessment: Pain in limb secondary to onychomycosis.  Plan: Debridement of toenails 1 through 5 bilateral.

## 2020-04-18 ENCOUNTER — Other Ambulatory Visit: Payer: Self-pay

## 2020-04-18 ENCOUNTER — Ambulatory Visit (INDEPENDENT_AMBULATORY_CARE_PROVIDER_SITE_OTHER): Payer: Medicare Other | Admitting: Podiatry

## 2020-04-18 ENCOUNTER — Encounter: Payer: Self-pay | Admitting: Podiatry

## 2020-04-18 DIAGNOSIS — Q828 Other specified congenital malformations of skin: Secondary | ICD-10-CM

## 2020-04-18 DIAGNOSIS — B351 Tinea unguium: Secondary | ICD-10-CM | POA: Diagnosis not present

## 2020-04-18 DIAGNOSIS — E1142 Type 2 diabetes mellitus with diabetic polyneuropathy: Secondary | ICD-10-CM | POA: Diagnosis not present

## 2020-04-18 DIAGNOSIS — M79676 Pain in unspecified toe(s): Secondary | ICD-10-CM

## 2020-04-18 NOTE — Progress Notes (Signed)
He presents today chief complaint of painfully elongated toenails 1 through 5 of the bilateral foot.  Objective: Pulses remain palpable he retains his tinea pedis to the toes and forefoot.  Toenails are long thick yellow dystrophic clinically mycotic and painful.  No open lesions or wounds are noted.  Assessment: Pain in limb secondary to onychomycosis.  Plan: Debridement of toenails 1 through 5 bilateral.

## 2020-05-28 ENCOUNTER — Emergency Department
Admission: EM | Admit: 2020-05-28 | Discharge: 2020-05-28 | Disposition: A | Discharge status: Home / Self Care | Payer: Medicare Other | Attending: Emergency Medicine | Admitting: Emergency Medicine

## 2020-05-28 ENCOUNTER — Emergency Department: Payer: Medicare Other

## 2020-05-28 ENCOUNTER — Encounter: Payer: Self-pay | Admitting: Emergency Medicine

## 2020-05-28 ENCOUNTER — Other Ambulatory Visit: Payer: Self-pay

## 2020-05-28 DIAGNOSIS — I10 Essential (primary) hypertension: Secondary | ICD-10-CM | POA: Diagnosis not present

## 2020-05-28 DIAGNOSIS — R0602 Shortness of breath: Secondary | ICD-10-CM | POA: Insufficient documentation

## 2020-05-28 DIAGNOSIS — R079 Chest pain, unspecified: Secondary | ICD-10-CM | POA: Diagnosis not present

## 2020-05-28 DIAGNOSIS — Z87891 Personal history of nicotine dependence: Secondary | ICD-10-CM | POA: Diagnosis not present

## 2020-05-28 DIAGNOSIS — W01198A Fall on same level from slipping, tripping and stumbling with subsequent striking against other object, initial encounter: Secondary | ICD-10-CM | POA: Diagnosis not present

## 2020-05-28 DIAGNOSIS — R519 Headache, unspecified: Secondary | ICD-10-CM | POA: Insufficient documentation

## 2020-05-28 DIAGNOSIS — Z79899 Other long term (current) drug therapy: Secondary | ICD-10-CM | POA: Diagnosis not present

## 2020-05-28 DIAGNOSIS — Y92009 Unspecified place in unspecified non-institutional (private) residence as the place of occurrence of the external cause: Secondary | ICD-10-CM | POA: Diagnosis not present

## 2020-05-28 DIAGNOSIS — R002 Palpitations: Secondary | ICD-10-CM | POA: Diagnosis not present

## 2020-05-28 DIAGNOSIS — Z7984 Long term (current) use of oral hypoglycemic drugs: Secondary | ICD-10-CM | POA: Insufficient documentation

## 2020-05-28 DIAGNOSIS — M25551 Pain in right hip: Secondary | ICD-10-CM | POA: Diagnosis not present

## 2020-05-28 DIAGNOSIS — E119 Type 2 diabetes mellitus without complications: Secondary | ICD-10-CM | POA: Insufficient documentation

## 2020-05-28 DIAGNOSIS — Z7982 Long term (current) use of aspirin: Secondary | ICD-10-CM | POA: Diagnosis not present

## 2020-05-28 DIAGNOSIS — Z8551 Personal history of malignant neoplasm of bladder: Secondary | ICD-10-CM | POA: Insufficient documentation

## 2020-05-28 DIAGNOSIS — W19XXXA Unspecified fall, initial encounter: Secondary | ICD-10-CM

## 2020-05-28 MED ORDER — ACETAMINOPHEN 500 MG PO TABS
1000.0000 mg | ORAL_TABLET | Freq: Once | ORAL | Status: AC
  Administered 2020-05-28: 1000 mg via ORAL
  Filled 2020-05-28: qty 2

## 2020-05-28 NOTE — ED Provider Notes (Signed)
[facility] Emergency Department Provider Note ____________________________________________   First MD Initiated Contact with Patient 05/28/20 1003     (approximate)  I have reviewed the triage vital signs and the nursing notes.  HISTORY  Chief Complaint Fall   HPI Herbert Phillips is a 86 y.o. malewho presents to the ED for evaluation of fall  Chart review indicates patient HTN, HLD and previous left hip fracture repair.  Patient takes ASA 81 as his only thinner. Patient reports being ambulatory with a walker at baseline and compliance with this. He denies any recent illnesses.  Patient reports using his walker around the house, and getting tripped up on a throw rug, causing him to fall backwards and strike his occiput on the floor.  He denies any syncope or preceding symptoms such as headache, shortness of breath, palpitations or chest pain.  Reports feeling fine now, but reports concern for the hardware over his left hip because he felt a "full" sensation when he was able to get up and ambulate after this fall today.  Denies any pain right now and reports he feels well.  Daughter-in-law at the bedside indicates that he seems normal, but is additionally concerned about his hardware in the left hip.   Past Medical History:  Diagnosis Date  . Arthritis   . B12 deficiency   . Bladder cancer ([place])    2 yrs ago  . Dysrhythmia    Bradycardia  . Hip fracture, left ([place]) 02/04/2016  . Hypertension   . Non-insulin dependent type 2 diabetes mellitus ([place])   . Sleep apnea     Patient Active Problem List   Diagnosis Date Noted  . Bradycardia 04/05/2018  . 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 Cancer of overlapping sites of bladder ([place]) 05/30/2016  . Acute deep vein thrombosis (DVT) of left lower extremity ([place]) 02/23/2016  . Closed displaced subtrochanteric fracture of left femur ([place]) 02/04/2016  . Diabetes ([place])  09/14/2013  . Dyslipidemia 09/14/2013  . HTN (hypertension) 09/14/2013  . Sleep apnea 09/14/2013  . Diabetes mellitus ([place]) 09/14/2013    Past Surgical History:  Procedure Laterality Date  . cyst on spine  1961  . EAR CYST EXCISION    . INTRAMEDULLARY (IM) NAIL INTERTROCHANTERIC Left 02/04/2016   Procedure: INTRAMEDULLARY (IM) NAIL INTERTROCHANTRIC;  Surgeon: Corky Mull, MD;  Location: [facility] ORS;  Service: Orthopedics;  Laterality: Left;  . TRANSURETHRAL RESECTION OF BLADDER TUMOR N/A 10/24/2014   Procedure: TRANSURETHRAL RESECTION OF BLADDER TUMOR (TURBT);  Surgeon: Royston Cowper, MD;  Location: [facility] ORS;  Service: Urology;  Laterality: N/A;    Prior to Admission medications   Medication Sig Start Date End Date Taking? Authorizing Provider  allopurinol (ZYLOPRIM) 100 MG tablet Take 100 mg by mouth daily. 01/13/18   [provider]  aspirin EC 81 MG tablet Take 81 mg by mouth daily.     [provider]  bacitracin ointment Apply topically 2 (two) times daily. 02/07/16   Hower, Aaron Mose, MD  bacitracin ophthalmic ointment USE A SMALL AMOUNT IN LEFT EYE AFTER INJECTION AS NEEDED 01/18/19   [provider]  Cholecalciferol (VITAMIN D PO) Take by mouth.    [provider]  diphenhydrAMINE (BENADRYL) 12.5 MG/5ML elixir Take 5-10 mLs (12.5-25 mg total) by mouth every 4 (four) hours as needed for itching. Patient not taking: Reported on 06/09/2019 02/07/16   Hower, Aaron Mose, MD  docusate sodium (COLACE) 100 MG capsule  Take 1 capsule (100 mg total) by mouth 2 (two) times daily. 02/07/16   Hower, Aaron Mose, MD  FREESTYLE LITE test strip USE 1 STRIP TO CHECK GLUCOSE TWICE DAILY 01/04/18   [provider]  furosemide (LASIX) 20 MG tablet Take 20 mg by mouth as needed.  04/02/16   [provider]  Glucosamine Sulfate (GLUCOSAMINE RELIEF) 1000 MG TABS Take 1,000 mg by mouth daily.     [provider]  lovastatin (MEVACOR) 20 MG tablet Take 20 mg  by mouth daily at 6 PM.  12/28/14   [provider]  meclizine (ANTIVERT) 12.5 MG tablet TAKE 1 TABLET BY MOUTH EVERY 8 HOURS AS NEEDED FOR DIZZINESS 11/28/18   [provider]  metFORMIN (GLUCOPHAGE) 500 MG tablet Take 500 mg by mouth 2 (two) times daily with a meal.  12/13/14   [provider]  Nutritional Supplements (BLADDER 2.2) TABS Take by mouth.    [provider]  ramipril (ALTACE) 2.5 MG capsule Take 2.5 mg by mouth daily.    [provider]  traZODone (DESYREL) 50 MG tablet Take by mouth. 03/18/16   [provider]  vitamin B-12 (CYANOCOBALAMIN) 1000 MCG tablet Take 1,000 mcg by mouth daily.    [provider]  XYLIMELTS 500 MG DISK TAKE 1 TABLET BY MOUTH EVERY 8 HOURS AS NEEDED 08/13/18   [provider]    Allergies Patient has no known allergies.  Family History  Problem Relation Age of Onset  . Diabetes Neg Hx     Social History Social History   Tobacco Use  . Smoking status: Former Smoker    Quit date: 06/13/1976    Years since quitting: 43.9  . Smokeless tobacco: Never Used  Substance Use Topics  . Alcohol use: Yes    Comment: monthly 3 beers since xmas  . Drug use: No    Review of Systems  Constitutional: No fever/chills Eyes: No visual changes. ENT: No sore throat. Cardiovascular: Denies chest pain. Respiratory: Denies shortness of breath. Gastrointestinal: No abdominal pain.  No nausea, no vomiting.  No diarrhea.  No constipation. Genitourinary: Negative for dysuria. Musculoskeletal: Negative for back pain. Skin: Negative for rash. Neurological: Negative for headaches, focal weakness or numbness.  ____________________________________________   PHYSICAL EXAM:  VITAL SIGNS: Vitals:   05/28/20 1000 05/28/20 1252  BP: 122/76 120/77  Pulse: 90 80  Resp: 20 16  Temp: 97.7 F (36.5 C) 98 F (36.7 C)  SpO2: 97% 98%      Constitutional: Alert and oriented. Well appearing and in  no acute distress. Eyes: Conjunctivae are normal. PERRL. EOMI. Head: Atraumatic.  No hematoma, laceration or abrasion identified. Nose: No congestion/rhinnorhea. Mouth/Throat: Mucous membranes are moist.  Oropharynx non-erythematous. Neck: No stridor. No cervical spine tenderness to palpation. Cardiovascular: Normal rate, regular rhythm. Grossly normal heart sounds.  Good peripheral circulation. Respiratory: Normal respiratory effort.  No retractions. Lungs CTAB. Gastrointestinal: Soft , nondistended, nontender to palpation. No CVA tenderness. Musculoskeletal: No lower extremity tenderness nor edema.  No joint effusions. No signs of acute trauma. Old skin tear to his left elbow healing appropriately.  Full active and passive ROM of his left elbow without pain.  Distally neurovascularly intact. Full palpation of all 4 extremities otherwise not evidence of acute trauma deformity or pain. Neurologic:  Normal speech and language. No gross focal neurologic deficits are appreciated. No gait instability noted. Cranial nerves II through XII intact 5/5 strength and sensation in all 4 extremities Skin:  Skin is warm, dry and intact. No rash noted. Psychiatric: Mood and affect are normal. Speech and behavior are normal.  ____________________________________________   LABS (all labs ordered are listed, but only abnormal results are displayed)  Labs Reviewed - No data to display  ____________________________________________  RADIOLOGY  ED MD interpretation: CT head and neck reviewed by me without evidence of acute intracranial pathology or cervical spinal fracture, respectively. Plain films of his pelvis, left hip, left femur and left knee reviewed by me without evidence of acute bony injury or fracture around his prosthesis.  Official radiology report(s): CT Head Wo Contrast  Result Date: 05/28/2020 CLINICAL DATA:  86 year old male with history of trauma from a fall with occipital head trauma.  On aspirin. Evaluate for intracranial hemorrhage. EXAM: CT HEAD WITHOUT CONTRAST CT CERVICAL SPINE WITHOUT CONTRAST TECHNIQUE: Multidetector CT imaging of the head and cervical spine was performed following the standard protocol without intravenous contrast. Multiplanar CT image reconstructions of the cervical spine were also generated. COMPARISON:  CT of the head and cervical spine 02/04/2016. FINDINGS: CT HEAD FINDINGS Brain: Mild cerebral and cerebellar atrophy. Patchy and confluent areas of decreased attenuation are noted throughout the deep and periventricular white matter of the cerebral hemispheres bilaterally, compatible with chronic microvascular ischemic disease. No evidence of acute infarction, hemorrhage, hydrocephalus, extra-axial collection or mass lesion/mass effect. Vascular: No hyperdense vessel or unexpected calcification. Skull: Normal. Negative for fracture or focal lesion. Sinuses/Orbits: No acute finding. Mild multifocal mucosal thickening in the ethmoid sinuses bilaterally and in the right sphenoid sinus, similar to the prior study. Other: None. CT CERVICAL SPINE FINDINGS Alignment: Normal. Skull base and vertebrae: No acute fracture. No primary bone lesion or focal pathologic process. Soft tissues and spinal canal: No prevertebral fluid or swelling. No visible canal hematoma. Disc levels: Mild multilevel degenerative disc disease, most severe at C6-C7. Mild-to-moderate multilevel facet arthropathy. Upper chest: Mild centrilobular and paraseptal emphysema with bilateral apical pleuroparenchymal scarring. Other: There are no aggressive appearing lytic or blastic lesions noted in the visualized portions of the skeleton. IMPRESSION: 1. No evidence of significant acute traumatic injury to the skull, brain or cervical spine. 2. Mild cerebral and cerebral atrophy with extensive chronic microvascular ischemic changes in the cerebral white matter, as above. 3. Multilevel degenerative disc disease and  cervical spondylosis, as above. Electronically Signed   By: Vinnie Langton M.D.   On: 05/28/2020 11:13   CT Cervical Spine Wo Contrast  Result Date: 05/28/2020 CLINICAL DATA:  86 year old male with history of trauma from a fall with occipital head trauma. On aspirin. Evaluate for intracranial hemorrhage. EXAM: CT HEAD WITHOUT CONTRAST CT CERVICAL SPINE WITHOUT CONTRAST TECHNIQUE: Multidetector CT imaging of the head and cervical spine was performed following the standard protocol without intravenous contrast. Multiplanar CT image reconstructions of the cervical spine were also generated. COMPARISON:  CT of the head and cervical spine 02/04/2016. FINDINGS: CT HEAD FINDINGS Brain: Mild cerebral and cerebellar atrophy. Patchy and confluent areas of decreased attenuation are noted throughout the deep and periventricular white matter of the cerebral hemispheres bilaterally, compatible with chronic microvascular ischemic disease. No evidence of acute infarction, hemorrhage, hydrocephalus, extra-axial collection or mass lesion/mass effect. Vascular: No hyperdense vessel or unexpected calcification. Skull: Normal. Negative for fracture or focal lesion. Sinuses/Orbits: No acute finding. Mild multifocal mucosal thickening in the ethmoid sinuses bilaterally and in the right sphenoid sinus, similar to the prior study. Other: None. CT CERVICAL SPINE FINDINGS Alignment: Normal. Skull base and vertebrae: No acute fracture. No  primary bone lesion or focal pathologic process. Soft tissues and spinal canal: No prevertebral fluid or swelling. No visible canal hematoma. Disc levels: Mild multilevel degenerative disc disease, most severe at C6-C7. Mild-to-moderate multilevel facet arthropathy. Upper chest: Mild centrilobular and paraseptal emphysema with bilateral apical pleuroparenchymal scarring. Other: There are no aggressive appearing lytic or blastic lesions noted in the visualized portions of the skeleton. IMPRESSION: 1. No  evidence of significant acute traumatic injury to the skull, brain or cervical spine. 2. Mild cerebral and cerebral atrophy with extensive chronic microvascular ischemic changes in the cerebral white matter, as above. 3. Multilevel degenerative disc disease and cervical spondylosis, as above. Electronically Signed   By: Vinnie Langton M.D.   On: 05/28/2020 11:13   DG Knee Complete 4 Views Left  Result Date: 05/28/2020 CLINICAL DATA:  Fall.  Left hip and knee pain. EXAM: LEFT KNEE - COMPLETE 4+ VIEW COMPARISON:  02/04/2016 FINDINGS: Distal portion of the intramedullary nail visualized in the distal femur. No acute bony abnormality. Specifically, no fracture, subluxation, or dislocation. No joint effusion. Joint spaces maintained. IMPRESSION: No acute bony abnormality. Electronically Signed   By: Rolm Baptise M.D.   On: 05/28/2020 11:42   DG Hip Unilat W or Wo Pelvis 2-3 Views Left  Result Date: 05/28/2020 CLINICAL DATA:  Fall, left hip pain EXAM: DG HIP (WITH OR WITHOUT PELVIS) 2-3V LEFT COMPARISON:  02/04/2016, 06/09/2019 FINDINGS: Patient is status post ORIF of a well healed proximal left femoral fracture via intramedullary rod with distal interlocking screw and proximal lag screw. No evidence of periprosthetic lucency or fracture. Hip joints are intact with mild joint space narrowing. No dislocation. Pelvic bony ring intact. Soft tissues within normal limits. IMPRESSION: 1. No acute osseous abnormality, left hip. 2. Prior ORIF of a well healed left femoral fracture without evidence of hardware complication. Electronically Signed   By: Davina Poke D.O.   On: 05/28/2020 11:44    ____________________________________________   PROCEDURES and INTERVENTIONS  Procedure(s) performed (including Critical Care):  Procedures  Medications  acetaminophen (TYLENOL) tablet 1,000 mg (1,000 mg Oral Given 05/28/20 1157)    ____________________________________________   MDM / ED COURSE   Pleasant  86 year old male presents to the ED after mechanical fall without evidence of acute injury, and amenable to outpatient management.  Normal vitals on room air.  Exam without evidence of any acute derangements, distress or neurovascular deficits.  He has an old skin tear to his left elbow from a fall that occurred multiple weeks ago, but no further evidence of acute pathology.  No indications for blood work.  CT head and neck without evidence of intracranial hemorrhage, CVA or cervical spine fracture.  Plain films of his pelvis, hips and around his hardware without evidence of periprosthetic fracture or acute bony injury.  Patient subsequently ambulatory at his baseline with a walker throughout the ED.  We discussed return precautions for the ED and patient is medically stable for discharge home.   Clinical Course as of May 28 1322  Mon May 28, 2020  1230 Patient ambulating with walker   [DS]    Clinical Course User Index [DS] Vladimir Crofts, MD    ____________________________________________   FINAL CLINICAL IMPRESSION(S) / ED DIAGNOSES  Final diagnoses:  Fall, initial encounter     ED Discharge Orders    None       Adiba Fargnoli Tamala Julian   Note:  This document was prepared using Dragon voice recognition software and may include unintentional dictation errors.  Vladimir Crofts, MD 05/28/20 [phone]

## 2020-05-28 NOTE — ED Triage Notes (Addendum)
Pt presents to the ED via EMS from home. Pt lives in the independent living at [place]. Pt had a mechanical fall around 0400 this AM. Pt denies states he did hit his head but it does hurt. Denies blood thinner. Pt had a rod placed in his L leg approx 8-10 years ago and states that he feels that he is unbalanced on his L leg when he tries to get up. Denies pain. Pt is A&Ox4 and NAD. No obvious deformity.

## 2020-05-28 NOTE — Discharge Instructions (Signed)
Use Tylenol for pain and fevers.  Up to 1000 mg per dose, up to 4 times per day.  Do not take more than 4000 mg of Tylenol/acetaminophen within 24 hours..  As we discussed, there is no signs of broken bones or hardware malfunctions on your imaging.  Please use Tylenol, as above to treat your soreness and pain.  Follow-up with your primary care doctor in the next week to discuss this visit.  Return to the ED with any worsening symptoms or inability to bear any weight on your leg.

## 2020-05-28 NOTE — ED Notes (Signed)
Ambulated pt to bathroom. Pt ambulated with walker with minimal assistance. Pt states Lt LE muscle feels sore, swollen. Is noted to favor this LE, but is able to bear weight on it. Provider updated.

## 2020-06-08 ENCOUNTER — Other Ambulatory Visit: Payer: Self-pay | Admitting: *Deleted

## 2020-06-08 ENCOUNTER — Inpatient Hospital Stay: Payer: Medicare Other | Attending: Internal Medicine

## 2020-06-08 ENCOUNTER — Ambulatory Visit
Admission: RE | Admit: 2020-06-08 | Discharge: 2020-06-08 | Disposition: A | Discharge status: Home / Self Care | Payer: Medicare Other | Source: Ambulatory Visit | Attending: Internal Medicine | Admitting: Internal Medicine

## 2020-06-08 ENCOUNTER — Other Ambulatory Visit: Payer: Self-pay

## 2020-06-08 DIAGNOSIS — Z9221 Personal history of antineoplastic chemotherapy: Secondary | ICD-10-CM | POA: Insufficient documentation

## 2020-06-08 DIAGNOSIS — M129 Arthropathy, unspecified: Secondary | ICD-10-CM | POA: Diagnosis not present

## 2020-06-08 DIAGNOSIS — Z87891 Personal history of nicotine dependence: Secondary | ICD-10-CM | POA: Insufficient documentation

## 2020-06-08 DIAGNOSIS — Z7982 Long term (current) use of aspirin: Secondary | ICD-10-CM | POA: Insufficient documentation

## 2020-06-08 DIAGNOSIS — Z8551 Personal history of malignant neoplasm of bladder: Secondary | ICD-10-CM | POA: Diagnosis present

## 2020-06-08 DIAGNOSIS — G4730 Sleep apnea, unspecified: Secondary | ICD-10-CM | POA: Insufficient documentation

## 2020-06-08 DIAGNOSIS — R7989 Other specified abnormal findings of blood chemistry: Secondary | ICD-10-CM | POA: Insufficient documentation

## 2020-06-08 DIAGNOSIS — E538 Deficiency of other specified B group vitamins: Secondary | ICD-10-CM | POA: Diagnosis not present

## 2020-06-08 DIAGNOSIS — I10 Essential (primary) hypertension: Secondary | ICD-10-CM | POA: Diagnosis not present

## 2020-06-08 DIAGNOSIS — Z923 Personal history of irradiation: Secondary | ICD-10-CM | POA: Diagnosis not present

## 2020-06-08 DIAGNOSIS — E119 Type 2 diabetes mellitus without complications: Secondary | ICD-10-CM | POA: Diagnosis not present

## 2020-06-08 DIAGNOSIS — C678 Malignant neoplasm of overlapping sites of bladder: Secondary | ICD-10-CM

## 2020-06-08 DIAGNOSIS — Z79899 Other long term (current) drug therapy: Secondary | ICD-10-CM | POA: Diagnosis not present

## 2020-06-08 LAB — CBC WITH DIFFERENTIAL/PLATELET
Abs Immature Granulocytes: 0.05 10*3/uL (ref 0.00–0.07)
Basophils Absolute: 0.1 10*3/uL (ref 0.0–0.1)
Basophils Relative: 1 %
Eosinophils Absolute: 0.1 10*3/uL (ref 0.0–0.5)
Eosinophils Relative: 2 %
HCT: 45.5 % (ref 39.0–52.0)
Hemoglobin: 15.1 g/dL (ref 13.0–17.0)
Immature Granulocytes: 1 %
Lymphocytes Relative: 17 %
Lymphs Abs: 1.3 10*3/uL (ref 0.7–4.0)
MCH: 31.7 pg (ref 26.0–34.0)
MCHC: 33.2 g/dL (ref 30.0–36.0)
MCV: 95.4 fL (ref 80.0–100.0)
Monocytes Absolute: 0.9 10*3/uL (ref 0.1–1.0)
Monocytes Relative: 12 %
Neutro Abs: 5.2 10*3/uL (ref 1.7–7.7)
Neutrophils Relative %: 67 %
Platelets: 255 10*3/uL (ref 150–400)
RBC: 4.77 MIL/uL (ref 4.22–5.81)
RDW: 13.2 % (ref 11.5–15.5)
WBC: 7.7 10*3/uL (ref 4.0–10.5)
nRBC: 0 % (ref 0.0–0.2)

## 2020-06-08 LAB — COMPREHENSIVE METABOLIC PANEL
ALT: 19 U/L (ref 0–44)
AST: 20 U/L (ref 15–41)
Albumin: 3.8 g/dL (ref 3.5–5.0)
Alkaline Phosphatase: 129 U/L — ABNORMAL HIGH (ref 38–126)
Anion gap: 10 (ref 5–15)
BUN: 26 mg/dL — ABNORMAL HIGH (ref 8–23)
CO2: 28 mmol/L (ref 22–32)
Calcium: 9.2 mg/dL (ref 8.9–10.3)
Chloride: 100 mmol/L (ref 98–111)
Creatinine, Ser: 0.87 mg/dL (ref 0.61–1.24)
GFR, Estimated: >60 mL/min (ref >60)
Glucose, Bld: 105 mg/dL — ABNORMAL HIGH (ref 70–99)
Potassium: 4.2 mmol/L (ref 3.5–5.1)
Sodium: 138 mmol/L (ref 135–145)
Total Bilirubin: 0.7 mg/dL (ref 0.3–1.2)
Total Protein: 7.3 g/dL (ref 6.5–8.1)

## 2020-06-08 MED ORDER — IOHEXOL 300 MG/ML  SOLN
100.0000 mL | Freq: Once | INTRAMUSCULAR | Status: AC | PRN
  Administered 2020-06-08: 100 mL via INTRAVENOUS

## 2020-06-11 ENCOUNTER — Other Ambulatory Visit: Payer: Self-pay

## 2020-06-11 ENCOUNTER — Inpatient Hospital Stay (HOSPITAL_BASED_OUTPATIENT_CLINIC_OR_DEPARTMENT_OTHER): Payer: Medicare Other | Admitting: Internal Medicine

## 2020-06-11 DIAGNOSIS — Z8551 Personal history of malignant neoplasm of bladder: Secondary | ICD-10-CM | POA: Diagnosis not present

## 2020-06-11 DIAGNOSIS — C678 Malignant neoplasm of overlapping sites of bladder: Secondary | ICD-10-CM

## 2020-06-11 NOTE — Progress Notes (Signed)
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  Tumor was muscle invasion with lymphovascular invasion Patient  had a transurethral resection of bladder tumor (Oct 24, 2014) 3.patient will start cis-platinum and radiation therapy from June of 2016 4.  Patient has finished total 6 cycles of chemotherapy with cis-platinum on July s/p RT finished on August 4 of 2016.  # last cystoscopy- Dr.Wolfe/Urology- Nov 2018 NED as per pt.   DIAGNOSIS: bladder cancer  STAGE:  II       ;GOALS: ? cure  CURRENT/MOST RECENT THERAPY: surveillance       Cancer of overlapping sites of bladder [facility])      INTERVAL HISTORY:  Herbert Phillips 86 y.o.  male pleasant patient above history of Stage II bladder cancer underwent bladder sparing definitive chemoradiation is here for follow-up/review results of the CT scan.  No recent falls.  No blood in stools or black stools.  No worsening back pain.  No nausea vomiting.  No headaches.  Review of Systems  Constitutional: Negative for chills, diaphoresis, fever, malaise/fatigue and weight loss.  HENT: Negative for nosebleeds and sore throat.   Eyes: Negative for double vision.  Respiratory: Negative for cough, hemoptysis, sputum production, shortness of breath and wheezing.   Cardiovascular: Negative for chest pain, palpitations, orthopnea and leg swelling.  Gastrointestinal: Negative for abdominal  pain, blood in stool, constipation, diarrhea, heartburn, melena, nausea and vomiting.  Genitourinary: Negative for dysuria, frequency and urgency.  Musculoskeletal: Positive for back pain and joint pain.  Skin: Negative.  Negative for itching and rash.  Neurological: Negative for dizziness, tingling, focal weakness, weakness and headaches.  Endo/Heme/Allergies: Does not bruise/bleed easily.  Psychiatric/Behavioral: Negative for depression. The patient is not nervous/anxious and does not have insomnia.      PAST MEDICAL HISTORY :  Past Medical History:  Diagnosis Date  . Arthritis   . B12 deficiency   . Bladder cancer ([place])    2 yrs ago  . Dysrhythmia    Bradycardia  . Hip fracture, left ([place]) 02/04/2016  . Hypertension   . Non-insulin dependent type 2 diabetes mellitus ([place])   . Sleep apnea     PAST SURGICAL HISTORY :   Past Surgical History:  Procedure Laterality Date  . cyst on spine  1961  . EAR CYST EXCISION    . INTRAMEDULLARY (IM) NAIL INTERTROCHANTERIC Left 02/04/2016   Procedure: INTRAMEDULLARY (IM) NAIL INTERTROCHANTRIC;  Surgeon: Corky Mull, MD;  Location: [facility] ORS;  Service: Orthopedics;  Laterality: Left;  . TRANSURETHRAL RESECTION OF BLADDER TUMOR N/A 10/24/2014   Procedure: TRANSURETHRAL RESECTION OF BLADDER TUMOR (TURBT);  Surgeon: Royston Cowper, MD;  Location: [facility] ORS;  Service: Urology;  Laterality: N/A;    FAMILY HISTORY :   Family History  Problem Relation Age of Onset  . Diabetes Neg Hx     SOCIAL HISTORY:   Social History   Tobacco Use  . Smoking status: Former Smoker  Quit date: 06/13/1976    Years since quitting: 44.0  . Smokeless tobacco: Never Used  Substance Use Topics  . Alcohol use: Yes    Comment: monthly 3 beers since xmas  . Drug use: No    ALLERGIES:  has No Known Allergies.  MEDICATIONS:  Current Outpatient Medications  Medication Sig Dispense Refill  . allopurinol (ZYLOPRIM) 100 MG tablet Take 100 mg by mouth daily.  3   . aspirin EC 81 MG tablet Take 81 mg by mouth daily.     . bacitracin ointment Apply topically 2 (two) times daily. 120 g 0  . bacitracin ophthalmic ointment USE A SMALL AMOUNT IN LEFT EYE AFTER INJECTION AS NEEDED    . Cholecalciferol (VITAMIN D PO) Take by mouth.    . diphenhydrAMINE (BENADRYL) 12.5 MG/5ML elixir Take 5-10 mLs (12.5-25 mg total) by mouth every 4 (four) hours as needed for itching. (Patient not taking: Reported on 06/09/2019) 120 mL 0  . docusate sodium (COLACE) 100 MG capsule Take 1 capsule (100 mg total) by mouth 2 (two) times daily. 10 capsule 0  . FREESTYLE LITE test strip USE 1 STRIP TO CHECK GLUCOSE TWICE DAILY  5  . furosemide (LASIX) 20 MG tablet Take 20 mg by mouth as needed.     . Glucosamine Sulfate (GLUCOSAMINE RELIEF) 1000 MG TABS Take 1,000 mg by mouth daily.     Marland Kitchen lovastatin (MEVACOR) 20 MG tablet Take 20 mg by mouth daily at 6 PM.     . meclizine (ANTIVERT) 12.5 MG tablet TAKE 1 TABLET BY MOUTH EVERY 8 HOURS AS NEEDED FOR DIZZINESS    . metFORMIN (GLUCOPHAGE) 500 MG tablet Take 500 mg by mouth 2 (two) times daily with a meal.     . Nutritional Supplements (BLADDER 2.2) TABS Take by mouth.    . ramipril (ALTACE) 2.5 MG capsule Take 2.5 mg by mouth daily.    . traZODone (DESYREL) 50 MG tablet Take by mouth.    . vitamin B-12 (CYANOCOBALAMIN) 1000 MCG tablet Take 1,000 mcg by mouth daily.    . XYLIMELTS 500 MG DISK TAKE 1 TABLET BY MOUTH EVERY 8 HOURS AS NEEDED     No current facility-administered medications for this visit.    PHYSICAL EXAMINATION: ECOG PERFORMANCE STATUS: 0 - Asymptomatic  BP 105/65   Pulse 72   Temp (!) 96.6 F (35.9 C) (Tympanic)   Resp 20   Ht 5\' 8"  (1.727 m)   Wt 157 lb (71.2 kg)   BMI 23.87 kg/m   Filed Weights   06/11/20 1319  Weight: 157 lb (71.2 kg)    Physical Exam Constitutional:      Comments: Accompanied by his daughter-in-law.  He is in a wheelchair  HENT:     Head: Normocephalic and atraumatic.      Mouth/Throat:     Pharynx: No oropharyngeal exudate.  Eyes:     Pupils: Pupils are equal, round, and reactive to light.  Cardiovascular:     Rate and Rhythm: Normal rate and regular rhythm.  Pulmonary:     Effort: Pulmonary effort is normal. No respiratory distress.     Breath sounds: Normal breath sounds. No wheezing.  Abdominal:     General: Bowel sounds are normal. There is no distension.     Palpations: Abdomen is soft. There is no mass.     Tenderness: There is no abdominal tenderness. There is no guarding or rebound.  Musculoskeletal:        General:  No tenderness. Normal range of motion.     Cervical back: Normal range of motion and neck supple.  Skin:    General: Skin is warm.  Neurological:     Mental Status: He is alert and oriented to person, place, and time.  Psychiatric:        Mood and Affect: Affect normal.      LABORATORY DATA:  I have reviewed the data as listed    Component Value Date/Time   NA 138 06/08/2020 0925   NA 141 10/06/2014 0451   K 4.2 06/08/2020 0925   K 3.6 10/06/2014 0451   CL 100 06/08/2020 0925   CL 109 10/06/2014 0451   CO2 28 06/08/2020 0925   CO2 28 10/06/2014 0451   GLUCOSE 105 (H) 06/08/2020 0925   GLUCOSE 147 (H) 10/06/2014 0451   BUN 26 (H) 06/08/2020 0925   BUN 25 (H) 10/06/2014 0451   CREATININE 0.87 06/08/2020 0925   CREATININE 0.95 10/06/2014 0451   CALCIUM 9.2 06/08/2020 0925   CALCIUM 8.4 (L) 10/06/2014 0451   PROT 7.3 06/08/2020 0925   PROT 7.7 10/05/2014 1526   ALBUMIN 3.8 06/08/2020 0925   ALBUMIN 3.7 10/05/2014 1526   AST 20 06/08/2020 0925   AST 19 10/05/2014 1526   ALT 19 06/08/2020 0925   ALT 13 (L) 10/05/2014 1526   ALKPHOS 129 (H) 06/08/2020 0925   ALKPHOS 51 10/05/2014 1526   BILITOT 0.7 06/08/2020 0925   BILITOT 0.7 10/05/2014 1526   GFRNONAA >60 06/08/2020 0925   GFRNONAA >60 10/06/2014 0451   GFRAA >60 06/10/2019 1310   GFRAA >60 10/06/2014 0451    No results found for: SPEP, UPEP  Lab  Results  Component Value Date   WBC 7.7 06/08/2020   NEUTROABS 5.2 06/08/2020   HGB 15.1 06/08/2020   HCT 45.5 06/08/2020   MCV 95.4 06/08/2020   PLT 255 06/08/2020      Chemistry      Component Value Date/Time   NA 138 06/08/2020 0925   NA 141 10/06/2014 0451   K 4.2 06/08/2020 0925   K 3.6 10/06/2014 0451   CL 100 06/08/2020 0925   CL 109 10/06/2014 0451   CO2 28 06/08/2020 0925   CO2 28 10/06/2014 0451   BUN 26 (H) 06/08/2020 0925   BUN 25 (H) 10/06/2014 0451   CREATININE 0.87 06/08/2020 0925   CREATININE 0.95 10/06/2014 0451      Component Value Date/Time   CALCIUM 9.2 06/08/2020 0925   CALCIUM 8.4 (L) 10/06/2014 0451   ALKPHOS 129 (H) 06/08/2020 0925   ALKPHOS 51 10/05/2014 1526   AST 20 06/08/2020 0925   AST 19 10/05/2014 1526   ALT 19 06/08/2020 0925   ALT 13 (L) 10/05/2014 1526   BILITOT 0.7 06/08/2020 0925   BILITOT 0.7 10/05/2014 1526       RADIOGRAPHIC STUDIES: I have personally reviewed the radiological images as listed and agreed with the findings in the report. No results found.   ASSESSMENT & PLAN:  Cancer of overlapping sites of bladder ([place]) Bladder cancer stage- stage II; s/p chemo-RT; cystoscopy with Dr. Eliberto Ivory.  December 2021- CT scan shows no evidence of any recurrent disease.  Stable.  #Discussed given the fact that patient is 5 years out of his diagnosis of invasive bladder cancer-is most likely cured of his malignancy.  I would recommend stopping surveillance imaging.  However he will continue follow-up with Dr. Eliberto Ivory for cystoscopies.  # Elevated LFTs- 129- CT  A/P-NEG-no evidence of any malignancy.  Recommend continued follow-up with Dr. Hall Busing.  # DISPOSITION: # Follow up as needed- Dr.B  Cc; Dr.Tate/Dr. Eliberto Ivory.    No orders of the defined types were placed in this encounter.  All questions were answered. The patient knows to call the clinic with any problems, questions or concerns.      Cammie Sickle, MD 06/12/2020 2:35 PM

## 2020-06-11 NOTE — Assessment & Plan Note (Addendum)
Bladder cancer stage- stage II; s/p chemo-RT; cystoscopy with Dr. Eliberto Ivory.  December 2021- CT scan shows no evidence of any recurrent disease.  Stable.  #Discussed given the fact that patient is 5 years out of his diagnosis of invasive bladder cancer-is most likely cured of his malignancy.  I would recommend stopping surveillance imaging.  However he will continue follow-up with Dr. Eliberto Ivory for cystoscopies.  # Elevated LFTs- 129- CT A/P-NEG-no evidence of any malignancy.  Recommend continued follow-up with Dr. Hall Busing.  # DISPOSITION: # Follow up as needed- Dr.B  Cc; Dr.Tate/Dr. Eliberto Ivory.

## 2020-07-23 ENCOUNTER — Ambulatory Visit: Payer: Medicare Other | Admitting: Podiatry

## 2020-08-06 ENCOUNTER — Encounter: Payer: Self-pay | Admitting: Podiatry

## 2020-08-06 ENCOUNTER — Ambulatory Visit (INDEPENDENT_AMBULATORY_CARE_PROVIDER_SITE_OTHER): Payer: Medicare Other | Admitting: Podiatry

## 2020-08-06 ENCOUNTER — Other Ambulatory Visit: Payer: Self-pay

## 2020-08-06 DIAGNOSIS — Q828 Other specified congenital malformations of skin: Secondary | ICD-10-CM | POA: Diagnosis not present

## 2020-08-06 DIAGNOSIS — B351 Tinea unguium: Secondary | ICD-10-CM | POA: Diagnosis not present

## 2020-08-06 DIAGNOSIS — E1142 Type 2 diabetes mellitus with diabetic polyneuropathy: Secondary | ICD-10-CM | POA: Diagnosis not present

## 2020-08-06 DIAGNOSIS — M79676 Pain in unspecified toe(s): Secondary | ICD-10-CM

## 2020-08-06 NOTE — Progress Notes (Signed)
He presents today chief complaint of painfully elongated toenails.  Objective: Toenails are long thick yellow dystrophic with mycotic painful palpation no open lesions or wounds are noted.  Mild tinea pedis is noted.  Assessment: Pain in limb secondary to onychomycosis and tinea pedis  Plan: Told his daughter to obtain Lamisil AT cream over-the-counter to apply to his athlete's foot and I debrided his nails for him 1 through 5 bilateral today.

## 2020-11-14 ENCOUNTER — Ambulatory Visit: Payer: Medicare Other | Admitting: Podiatry

## 2020-11-21 ENCOUNTER — Encounter: Payer: Self-pay | Admitting: Podiatry

## 2020-11-21 ENCOUNTER — Ambulatory Visit (INDEPENDENT_AMBULATORY_CARE_PROVIDER_SITE_OTHER): Payer: Medicare Other | Admitting: Podiatry

## 2020-11-21 ENCOUNTER — Other Ambulatory Visit: Payer: Self-pay

## 2020-11-21 DIAGNOSIS — E1142 Type 2 diabetes mellitus with diabetic polyneuropathy: Secondary | ICD-10-CM | POA: Diagnosis not present

## 2020-11-21 DIAGNOSIS — B351 Tinea unguium: Secondary | ICD-10-CM

## 2020-11-21 DIAGNOSIS — D2372 Other benign neoplasm of skin of left lower limb, including hip: Secondary | ICD-10-CM

## 2020-11-21 DIAGNOSIS — M79676 Pain in unspecified toe(s): Secondary | ICD-10-CM

## 2020-11-21 DIAGNOSIS — D2371 Other benign neoplasm of skin of right lower limb, including hip: Secondary | ICD-10-CM | POA: Diagnosis not present

## 2020-11-21 NOTE — Progress Notes (Signed)
He presents today for diabetic checkup and for painful nails.  Objective: Vital signs stable he is alert oriented x3 pulses are palpable at about a 1/4 capillary fill time is immediate neurologic sensorium is slightly diminished per Semmes Weinstein monofilament deep tendon reflexes are intact muscle strength is normal and symmetrical bilaterally however.  Orthopedic evaluation demonstrates thin bony feet with hammertoe deformities bilateral.  Also cutaneous evaluation demonstrates supple well-hydrated cutis tinea pedis has resolved very nicely.  Nails are thick yellow dystrophic and clinically mycotic.  Assessment: Pain in limb secondary to diabetic peripheral neuropathy as well as angiopathy and cutaneous changes.  Pain in limb secondary to onychomycosis.  Plan: Debrided toenails 1 through 5 bilaterally.

## 2021-02-27 ENCOUNTER — Other Ambulatory Visit: Payer: Self-pay

## 2021-02-27 ENCOUNTER — Ambulatory Visit (INDEPENDENT_AMBULATORY_CARE_PROVIDER_SITE_OTHER): Payer: Medicare Other | Admitting: Podiatry

## 2021-02-27 ENCOUNTER — Encounter: Payer: Self-pay | Admitting: Podiatry

## 2021-02-27 DIAGNOSIS — E1142 Type 2 diabetes mellitus with diabetic polyneuropathy: Secondary | ICD-10-CM | POA: Diagnosis not present

## 2021-02-27 DIAGNOSIS — B351 Tinea unguium: Secondary | ICD-10-CM

## 2021-02-27 DIAGNOSIS — D2372 Other benign neoplasm of skin of left lower limb, including hip: Secondary | ICD-10-CM

## 2021-02-27 DIAGNOSIS — D2371 Other benign neoplasm of skin of right lower limb, including hip: Secondary | ICD-10-CM

## 2021-02-27 DIAGNOSIS — M79676 Pain in unspecified toe(s): Secondary | ICD-10-CM | POA: Diagnosis not present

## 2021-02-27 NOTE — Progress Notes (Signed)
He presents today chief complaint of painful elongated toenails and multiple calluses bilateral.  Objective: Vital signs are stable alert oriented x3 pulses remain palpable.  Much decrease in tinea pedis bilateral.  Toenails are long thick yellow dystrophic-like mycotic.  Multiple calluses plantar aspect of the bilateral foot.  Assessment: Pain limb secondary to onychomycosis and benign skin lesions.  Plan: Debridement of benign skin lesions.

## 2021-05-29 ENCOUNTER — Other Ambulatory Visit: Payer: Self-pay

## 2021-05-29 ENCOUNTER — Ambulatory Visit (INDEPENDENT_AMBULATORY_CARE_PROVIDER_SITE_OTHER): Payer: Medicare Other | Admitting: Podiatry

## 2021-05-29 ENCOUNTER — Encounter: Payer: Self-pay | Admitting: Podiatry

## 2021-05-29 DIAGNOSIS — E1142 Type 2 diabetes mellitus with diabetic polyneuropathy: Secondary | ICD-10-CM | POA: Diagnosis not present

## 2021-05-29 DIAGNOSIS — L84 Corns and callosities: Secondary | ICD-10-CM | POA: Diagnosis not present

## 2021-05-29 DIAGNOSIS — M79676 Pain in unspecified toe(s): Secondary | ICD-10-CM

## 2021-05-29 DIAGNOSIS — B351 Tinea unguium: Secondary | ICD-10-CM

## 2021-05-30 NOTE — Progress Notes (Signed)
  Subjective:  Patient ID: Herbert Phillips, male    DOB: 02-Apr-1926,  MRN: [id number]  Chief Complaint  Patient presents with   Debridement    Trim toenails/checked pre-ulcerative callus sub 1st MPJ right     87 y.o. male presents with the above complaint. History confirmed with patient.  Nails are thick elongated and painful again.  He is well-known to Dr. Milinda Pointer.  There is a new callus on the bottom of the right foot as well  Objective:  Physical Exam: warm, good capillary refill, no trophic changes or ulcerative lesions, normal DP and PT pulses, and reduced protective sensation bilateral, he has thickened elongated yellowed nail plates with subungual debris, submetatarsal 1 preulcerative callus with a blood blister, no ulceration plantar to this when I debrided it  Assessment:   1. Pain due to onychomycosis of toenail   2. Pre-ulcerative calluses   3. Diabetic polyneuropathy associated with type 2 diabetes mellitus ([place])      Plan:  Patient was evaluated and treated and all questions answered.  Patient educated on diabetes. Discussed proper diabetic foot care and discussed risks and complications of disease. Educated patient in depth on reasons to return to the office immediately should he/she discover anything concerning or new on the feet. All questions answered. Discussed proper shoes as well.   Discussed the etiology and treatment options for the condition in detail with the patient. Educated patient on the topical and oral treatment options for mycotic nails. Recommended debridement of the nails today. Sharp and mechanical debridement performed of all painful and mycotic nails today. Nails debrided in length and thickness using a nail nipper to level of comfort. Discussed treatment options including appropriate shoe gear. Follow up as needed for painful nails.   All symptomatic hyperkeratoses were safely debrided with a sterile #15 blade to patient's level of comfort  without incident. We discussed preventative and palliative care of these lesions including supportive and accommodative shoegear, padding, prefabricated and custom molded accommodative orthoses, use of a pumice stone and lotions/creams daily.    Return in about 3 months (around 08/27/2021) for at risk diabetic foot care.

## 2021-08-27 ENCOUNTER — Other Ambulatory Visit: Payer: Self-pay

## 2021-08-27 ENCOUNTER — Emergency Department
Admission: EM | Admit: 2021-08-27 | Discharge: 2021-08-27 | Disposition: A | Discharge status: Home / Self Care | Payer: Medicare Other | Attending: Emergency Medicine | Admitting: Emergency Medicine

## 2021-08-27 DIAGNOSIS — R5383 Other fatigue: Secondary | ICD-10-CM | POA: Diagnosis not present

## 2021-08-27 DIAGNOSIS — I10 Essential (primary) hypertension: Secondary | ICD-10-CM | POA: Diagnosis not present

## 2021-08-27 DIAGNOSIS — E119 Type 2 diabetes mellitus without complications: Secondary | ICD-10-CM | POA: Diagnosis not present

## 2021-08-27 LAB — CBC WITH DIFFERENTIAL/PLATELET
Abs Immature Granulocytes: 0.04 10*3/uL (ref 0.00–0.07)
Basophils Absolute: 0 10*3/uL (ref 0.0–0.1)
Basophils Relative: 1 %
Eosinophils Absolute: 0.1 10*3/uL (ref 0.0–0.5)
Eosinophils Relative: 2 %
HCT: 38.3 % — ABNORMAL LOW (ref 39.0–52.0)
Hemoglobin: 12.4 g/dL — ABNORMAL LOW (ref 13.0–17.0)
Immature Granulocytes: 1 %
Lymphocytes Relative: 13 %
Lymphs Abs: 0.9 10*3/uL (ref 0.7–4.0)
MCH: 31.7 pg (ref 26.0–34.0)
MCHC: 32.4 g/dL (ref 30.0–36.0)
MCV: 98 fL (ref 80.0–100.0)
Monocytes Absolute: 0.7 10*3/uL (ref 0.1–1.0)
Monocytes Relative: 10 %
Neutro Abs: 5.3 10*3/uL (ref 1.7–7.7)
Neutrophils Relative %: 73 %
Platelets: 212 10*3/uL (ref 150–400)
RBC: 3.91 MIL/uL — ABNORMAL LOW (ref 4.22–5.81)
RDW: 14.2 % (ref 11.5–15.5)
WBC: 7.1 10*3/uL (ref 4.0–10.5)
nRBC: 0 % (ref 0.0–0.2)

## 2021-08-27 LAB — MAGNESIUM: Magnesium: 2.3 mg/dL (ref 1.7–2.4)

## 2021-08-27 LAB — BASIC METABOLIC PANEL
Anion gap: 6 (ref 5–15)
BUN: 34 mg/dL — ABNORMAL HIGH (ref 8–23)
CO2: 24 mmol/L (ref 22–32)
Calcium: 8.4 mg/dL — ABNORMAL LOW (ref 8.9–10.3)
Chloride: 109 mmol/L (ref 98–111)
Creatinine, Ser: 1.14 mg/dL (ref 0.61–1.24)
GFR, Estimated: 60 mL/min — ABNORMAL LOW (ref >60)
Glucose, Bld: 136 mg/dL — ABNORMAL HIGH (ref 70–99)
Potassium: 4.9 mmol/L (ref 3.5–5.1)
Sodium: 139 mmol/L (ref 135–145)

## 2021-08-27 MED ORDER — SODIUM CHLORIDE 0.9 % IV BOLUS
500.0000 mL | Freq: Once | INTRAVENOUS | Status: AC
  Administered 2021-08-27: 500 mL via INTRAVENOUS

## 2021-08-27 NOTE — ED Provider Notes (Signed)
? ?[facility] ?Provider Note ? ? ? Event Date/Time  ? First MD Initiated Contact with Patient 08/27/21 1248   ?  (approximate) ? ? ?History  ? ?Fall (Pt from [place], stated his "legs gave out" from standing waiting on bus to take him back to facility. Pt denies fall, states he just sat down but did not feel like he could walk like he normally does. Pt reports feeling better now. Denies any medical history outside of diabetes. CBG 139. ) ? ? ?HPI ? ?Herbert Phillips is a 88 y.o. male with a history of hypertension hyperlipidemia diabetes previous DVT of the left leg is brought to the ED due to a potential fall. ? ?Patient states he was in his usual state of health, went shopping today.  While he was walking back to his car in the parking lot, he had to wait for a long time for a car to back up out of their space to leave.  During this time he felt his legs getting tired until he was too fatigued to remain standing.  2 other people assisted him gently to the ground, and he denies any fall or trauma.  Denies any pain.  After resting for a little while, he states that he feels back to normal. ? ?He reports that this happens frequently, and if he remained standing for long period of time he will get too tired to stand for a while.  Denies any new symptoms or changes.  Exercise tolerance is at baseline. ?  ? ? ?Physical Exam  ? ?Triage Vital Signs: ?ED Triage Vitals  ?Enc Vitals Group  ?   BP 08/27/21 1235 (!) 99/56  ?   Pulse Rate 08/27/21 1234 79  ?   Resp 08/27/21 1234 16  ?   Temp 08/27/21 1234 98.4 ?F (36.9 ?C)  ?   Temp Source 08/27/21 1234 Oral  ?   SpO2 08/27/21 1234 94 %  ?   Weight 08/27/21 1235 160 lb (72.6 kg)  ?   Height 08/27/21 1235 6' (1.829 m)  ?   Head Circumference --   ?   Peak Flow --   ?   Pain Score 08/27/21 1235 0  ?   Pain Loc --   ?   Pain Edu? --   ?   Excl. in [place]? --   ? ? ?Most recent vital signs: ?Vitals:  ? 08/27/21 1308 08/27/21 1311  ?BP: (!) 101/51   ?Pulse:  77 77  ?Resp: 11 14  ?Temp:    ?SpO2: 93% 93%  ? ? ? ?General: Awake, no distress.  ?CV:  Good peripheral perfusion.  Regular rate.  Frequent premature beats, PVCs on the monitor ?Resp:  Normal effort.  Clear to auscultation bilaterally ?Abd:  No distention.  Soft and nontender ?Other:  No signs of trauma, no wounds.  No lower extremity tenderness or swelling.  Symmetric calf circumference ? ? ?ED Results / Procedures / Treatments  ? ?Labs ?(all labs ordered are listed, but only abnormal results are displayed) ?Labs Reviewed  ?CBC WITH DIFFERENTIAL/PLATELET - Abnormal; Notable for the following components:  ?    Result Value  ? RBC 3.91 (*)   ? Hemoglobin 12.4 (*)   ? HCT 38.3 (*)   ? All other components within normal limits  ?BASIC METABOLIC PANEL - Abnormal; Notable for the following components:  ? Glucose, Bld 136 (*)   ? BUN 34 (*)   ? Calcium 8.4 (*)   ?  GFR, Estimated 60 (*)   ? All other components within normal limits  ?MAGNESIUM  ? ? ? ?EKG ? ?Interpreted by me ?Sinus rhythm rate of 78.  Normal axis, normal intervals.  Normal QRS ST segments and T waves.  3 PVCs on the strip. ? ? ?RADIOLOGY ? ? ? ? ?PROCEDURES: ? ?Critical Care performed: No ? ?.1-3 Lead EKG Interpretation ?Performed by: Carrie Mew, MD ?Authorized by: Carrie Mew, MD  ? ?  Interpretation: abnormal   ?  ECG rate:  90 ?  ECG rate assessment: normal   ?  Rhythm: sinus rhythm   ?  Ectopy: PVCs   ? ? ?MEDICATIONS ORDERED IN ED: ?Medications  ?sodium chloride 0.9 % bolus 500 mL (500 mLs Intravenous New Bag/Given 08/27/21 1307)  ? ? ? ?IMPRESSION / MDM / ASSESSMENT AND PLAN / ED COURSE  ?I reviewed the triage vital signs and the nursing notes. ?             ?               ? ?Differential diagnosis includes, but is not limited to, dehydration, electrolyte abnormality, anemia, deconditioning ? ?**The patient is on the cardiac monitor to evaluate for evidence of arrhythmia and/or significant heart rate changes.**} ? ?Patient presents  with leg fatigue causing him to sit down on the ground in the parking lot.  Denies any pain, no acute injuries or other medical complaints.  Reports that he is at baseline, and this episode is typical for him.  Due to PVCs on the monitor, labs were obtained which are unremarkable.  He does not require admission. ?  ? ? ?FINAL CLINICAL IMPRESSION(S) / ED DIAGNOSES  ? ?Final diagnoses:  ?Other fatigue  ? ? ? ?Rx / DC Orders  ? ?ED Discharge Orders   ? ? None  ? ?  ? ? ? ?Note:  This document was prepared using Dragon voice recognition software and may include unintentional dictation errors. ?  ?Carrie Mew, MD ?08/27/21 1358 ? ?

## 2021-08-28 ENCOUNTER — Ambulatory Visit: Payer: Medicare Other | Admitting: Podiatry

## 2021-09-02 ENCOUNTER — Ambulatory Visit (INDEPENDENT_AMBULATORY_CARE_PROVIDER_SITE_OTHER): Payer: Medicare Other | Admitting: Podiatry

## 2021-09-02 ENCOUNTER — Encounter: Payer: Self-pay | Admitting: Podiatry

## 2021-09-02 ENCOUNTER — Other Ambulatory Visit: Payer: Self-pay

## 2021-09-02 DIAGNOSIS — E1142 Type 2 diabetes mellitus with diabetic polyneuropathy: Secondary | ICD-10-CM

## 2021-09-02 DIAGNOSIS — D2372 Other benign neoplasm of skin of left lower limb, including hip: Secondary | ICD-10-CM

## 2021-09-02 DIAGNOSIS — B351 Tinea unguium: Secondary | ICD-10-CM | POA: Diagnosis not present

## 2021-09-02 DIAGNOSIS — D2371 Other benign neoplasm of skin of right lower limb, including hip: Secondary | ICD-10-CM | POA: Diagnosis not present

## 2021-09-02 DIAGNOSIS — M79676 Pain in unspecified toe(s): Secondary | ICD-10-CM

## 2021-09-02 NOTE — Progress Notes (Signed)
He presents today chief complaint of painfully elongated nails. ? ?Objective: Toenails are long thick yellow dystrophic clinically mycotic pulses are palpable.  Neurologic sensorium is diminished per Semmes Weinstein monofilament hammertoe deformities are noted.  No open lesions or wounds.  Tinea pedis is resolved. ? ?Assessment: Pain in limb secondary to onychomycosis. ? ?Diabetic peripheral neuropathy. ? ?Plan: Follow-up with me in 3 months I debrided nails 1 through 5 today.  Bilateral foot ?

## 2021-12-18 ENCOUNTER — Ambulatory Visit: Payer: Medicare Other | Admitting: Podiatry

## 2022-01-15 ENCOUNTER — Ambulatory Visit (INDEPENDENT_AMBULATORY_CARE_PROVIDER_SITE_OTHER): Payer: Medicare Other | Admitting: Podiatry

## 2022-01-15 ENCOUNTER — Encounter: Payer: Self-pay | Admitting: Podiatry

## 2022-01-15 DIAGNOSIS — D2372 Other benign neoplasm of skin of left lower limb, including hip: Secondary | ICD-10-CM

## 2022-01-15 DIAGNOSIS — E1142 Type 2 diabetes mellitus with diabetic polyneuropathy: Secondary | ICD-10-CM

## 2022-01-15 DIAGNOSIS — B351 Tinea unguium: Secondary | ICD-10-CM | POA: Diagnosis not present

## 2022-01-15 DIAGNOSIS — M79676 Pain in unspecified toe(s): Secondary | ICD-10-CM

## 2022-01-15 DIAGNOSIS — D2371 Other benign neoplasm of skin of right lower limb, including hip: Secondary | ICD-10-CM | POA: Diagnosis not present

## 2022-01-15 NOTE — Progress Notes (Signed)
He presents today chief complaint of painful elongated toenails 1 through 5 bilateral.  Objective: Vital signs are stable alert oriented x3 pulses remain palpable tenderness along thick yellow dystrophic clinical mycotic irregular.  Assessment: Pain in limb secondary onychomycosis.  Plan: Debridement of toenails 1 through 5 bilateral.

## 2022-03-10 ENCOUNTER — Ambulatory Visit: Payer: Medicare Other | Admitting: Dermatology

## 2022-04-23 ENCOUNTER — Encounter: Payer: Self-pay | Admitting: Podiatry

## 2022-04-23 ENCOUNTER — Ambulatory Visit (INDEPENDENT_AMBULATORY_CARE_PROVIDER_SITE_OTHER): Payer: Medicare Other | Admitting: Podiatry

## 2022-04-23 DIAGNOSIS — M79676 Pain in unspecified toe(s): Secondary | ICD-10-CM | POA: Diagnosis not present

## 2022-04-23 DIAGNOSIS — D2372 Other benign neoplasm of skin of left lower limb, including hip: Secondary | ICD-10-CM

## 2022-04-23 DIAGNOSIS — D2371 Other benign neoplasm of skin of right lower limb, including hip: Secondary | ICD-10-CM

## 2022-04-23 DIAGNOSIS — E1142 Type 2 diabetes mellitus with diabetic polyneuropathy: Secondary | ICD-10-CM

## 2022-04-23 DIAGNOSIS — B351 Tinea unguium: Secondary | ICD-10-CM | POA: Diagnosis not present

## 2022-04-23 NOTE — Progress Notes (Signed)
He presents today chief complaint of painfully elongated toenails and calluses.  Objective: Toenails are long thick yellow dystrophic clinical mycotic painful on palpation.  Multiple reactive hyperkeratotic lesions plantar aspect of the bilateral foot noncomplicated no ulcerative lesions.  Assessment: Pain in limb secondary to onychomycosis and benign skin lesions.  Plan: Debridement of benign skin lesions debridement of toenails 1 through 5 bilateral.

## 2022-07-23 ENCOUNTER — Ambulatory Visit: Payer: Medicare Other | Admitting: Podiatry

## 2022-08-04 ENCOUNTER — Ambulatory Visit: Payer: Medicare Other | Admitting: Podiatry

## 2022-10-01 ENCOUNTER — Ambulatory Visit: Payer: Medicare Other | Admitting: Podiatry

## 2022-10-17 ENCOUNTER — Encounter: Payer: Self-pay | Admitting: Internal Medicine

## 2022-10-17 ENCOUNTER — Emergency Department: Payer: Medicare Other

## 2022-10-17 ENCOUNTER — Inpatient Hospital Stay
Admission: EM | Admit: 2022-10-17 | Discharge: 2022-10-20 | DRG: 200 | Disposition: A | Discharge status: Skilled Nursing Facility | Payer: Medicare Other | Attending: Internal Medicine | Admitting: Internal Medicine

## 2022-10-17 ENCOUNTER — Other Ambulatory Visit: Payer: Self-pay

## 2022-10-17 DIAGNOSIS — Z66 Do not resuscitate: Secondary | ICD-10-CM | POA: Diagnosis not present

## 2022-10-17 DIAGNOSIS — Y92012 Bathroom of single-family (private) house as the place of occurrence of the external cause: Secondary | ICD-10-CM | POA: Diagnosis not present

## 2022-10-17 DIAGNOSIS — K5900 Constipation, unspecified: Secondary | ICD-10-CM | POA: Diagnosis present

## 2022-10-17 DIAGNOSIS — W1830XA Fall on same level, unspecified, initial encounter: Secondary | ICD-10-CM | POA: Diagnosis present

## 2022-10-17 DIAGNOSIS — Z7982 Long term (current) use of aspirin: Secondary | ICD-10-CM

## 2022-10-17 DIAGNOSIS — I10 Essential (primary) hypertension: Secondary | ICD-10-CM | POA: Diagnosis present

## 2022-10-17 DIAGNOSIS — W19XXXA Unspecified fall, initial encounter: Secondary | ICD-10-CM

## 2022-10-17 DIAGNOSIS — R296 Repeated falls: Secondary | ICD-10-CM | POA: Diagnosis present

## 2022-10-17 DIAGNOSIS — Z7984 Long term (current) use of oral hypoglycemic drugs: Secondary | ICD-10-CM

## 2022-10-17 DIAGNOSIS — I5032 Chronic diastolic (congestive) heart failure: Secondary | ICD-10-CM | POA: Diagnosis present

## 2022-10-17 DIAGNOSIS — Z79899 Other long term (current) drug therapy: Secondary | ICD-10-CM | POA: Diagnosis not present

## 2022-10-17 DIAGNOSIS — M109 Gout, unspecified: Secondary | ICD-10-CM | POA: Diagnosis present

## 2022-10-17 DIAGNOSIS — Z8739 Personal history of other diseases of the musculoskeletal system and connective tissue: Secondary | ICD-10-CM | POA: Diagnosis not present

## 2022-10-17 DIAGNOSIS — E538 Deficiency of other specified B group vitamins: Secondary | ICD-10-CM | POA: Diagnosis present

## 2022-10-17 DIAGNOSIS — M1990 Unspecified osteoarthritis, unspecified site: Secondary | ICD-10-CM | POA: Diagnosis present

## 2022-10-17 DIAGNOSIS — I110 Hypertensive heart disease with heart failure: Secondary | ICD-10-CM | POA: Diagnosis present

## 2022-10-17 DIAGNOSIS — S2242XA Multiple fractures of ribs, left side, initial encounter for closed fracture: Secondary | ICD-10-CM | POA: Diagnosis present

## 2022-10-17 DIAGNOSIS — J449 Chronic obstructive pulmonary disease, unspecified: Secondary | ICD-10-CM | POA: Diagnosis present

## 2022-10-17 DIAGNOSIS — E785 Hyperlipidemia, unspecified: Secondary | ICD-10-CM | POA: Diagnosis present

## 2022-10-17 DIAGNOSIS — I4891 Unspecified atrial fibrillation: Secondary | ICD-10-CM | POA: Diagnosis not present

## 2022-10-17 DIAGNOSIS — S22080S Wedge compression fracture of T11-T12 vertebra, sequela: Secondary | ICD-10-CM | POA: Diagnosis not present

## 2022-10-17 DIAGNOSIS — R0902 Hypoxemia: Secondary | ICD-10-CM | POA: Diagnosis present

## 2022-10-17 DIAGNOSIS — S270XXA Traumatic pneumothorax, initial encounter: Secondary | ICD-10-CM | POA: Diagnosis present

## 2022-10-17 DIAGNOSIS — I480 Paroxysmal atrial fibrillation: Secondary | ICD-10-CM | POA: Diagnosis present

## 2022-10-17 DIAGNOSIS — S22089A Unspecified fracture of T11-T12 vertebra, initial encounter for closed fracture: Secondary | ICD-10-CM | POA: Diagnosis present

## 2022-10-17 DIAGNOSIS — G4733 Obstructive sleep apnea (adult) (pediatric): Secondary | ICD-10-CM | POA: Diagnosis present

## 2022-10-17 DIAGNOSIS — E119 Type 2 diabetes mellitus without complications: Secondary | ICD-10-CM

## 2022-10-17 DIAGNOSIS — R0602 Shortness of breath: Secondary | ICD-10-CM | POA: Diagnosis present

## 2022-10-17 DIAGNOSIS — I82402 Acute embolism and thrombosis of unspecified deep veins of left lower extremity: Secondary | ICD-10-CM | POA: Diagnosis present

## 2022-10-17 DIAGNOSIS — Z8551 Personal history of malignant neoplasm of bladder: Secondary | ICD-10-CM | POA: Diagnosis not present

## 2022-10-17 DIAGNOSIS — M40204 Unspecified kyphosis, thoracic region: Secondary | ICD-10-CM | POA: Diagnosis present

## 2022-10-17 DIAGNOSIS — S22080A Wedge compression fracture of T11-T12 vertebra, initial encounter for closed fracture: Secondary | ICD-10-CM

## 2022-10-17 DIAGNOSIS — E1169 Type 2 diabetes mellitus with other specified complication: Secondary | ICD-10-CM | POA: Diagnosis present

## 2022-10-17 DIAGNOSIS — Z87891 Personal history of nicotine dependence: Secondary | ICD-10-CM

## 2022-10-17 DIAGNOSIS — R410 Disorientation, unspecified: Secondary | ICD-10-CM

## 2022-10-17 LAB — COMPREHENSIVE METABOLIC PANEL
ALT: 17 U/L (ref 0–44)
AST: 20 U/L (ref 15–41)
Albumin: 3 g/dL — ABNORMAL LOW (ref 3.5–5.0)
Alkaline Phosphatase: 64 U/L (ref 38–126)
Anion gap: 11 (ref 5–15)
BUN: 41 mg/dL — ABNORMAL HIGH (ref 8–23)
CO2: 22 mmol/L (ref 22–32)
Calcium: 8.5 mg/dL — ABNORMAL LOW (ref 8.9–10.3)
Chloride: 104 mmol/L (ref 98–111)
Creatinine, Ser: 1.07 mg/dL (ref 0.61–1.24)
GFR, Estimated: >60 mL/min (ref >60)
Glucose, Bld: 109 mg/dL — ABNORMAL HIGH (ref 70–99)
Potassium: 3.9 mmol/L (ref 3.5–5.1)
Sodium: 137 mmol/L (ref 135–145)
Total Bilirubin: 0.7 mg/dL (ref 0.3–1.2)
Total Protein: 6.6 g/dL (ref 6.5–8.1)

## 2022-10-17 LAB — URINALYSIS, ROUTINE W REFLEX MICROSCOPIC
Bilirubin Urine: NEGATIVE
Glucose, UA: NEGATIVE mg/dL
Hgb urine dipstick: NEGATIVE
Ketones, ur: 5 mg/dL — AB
Leukocytes,Ua: NEGATIVE
Nitrite: NEGATIVE
Protein, ur: NEGATIVE mg/dL
Specific Gravity, Urine: 1.023 (ref 1.005–1.030)
pH: 5 (ref 5.0–8.0)

## 2022-10-17 LAB — CBC
HCT: 41 % (ref 39.0–52.0)
Hemoglobin: 13.8 g/dL (ref 13.0–17.0)
MCH: 31.6 pg (ref 26.0–34.0)
MCHC: 33.7 g/dL (ref 30.0–36.0)
MCV: 93.8 fL (ref 80.0–100.0)
Platelets: 185 10*3/uL (ref 150–400)
RBC: 4.37 MIL/uL (ref 4.22–5.81)
RDW: 14.4 % (ref 11.5–15.5)
WBC: 12.7 10*3/uL — ABNORMAL HIGH (ref 4.0–10.5)
nRBC: 0 % (ref 0.0–0.2)

## 2022-10-17 LAB — TROPONIN I (HIGH SENSITIVITY)
Troponin I (High Sensitivity): 13 ng/L (ref <18)
Troponin I (High Sensitivity): 11 ng/L (ref <18)

## 2022-10-17 LAB — PROTIME-INR
INR: 1.3 — ABNORMAL HIGH (ref 0.8–1.2)
Prothrombin Time: 15.8 seconds — ABNORMAL HIGH (ref 11.4–15.2)

## 2022-10-17 LAB — APTT: aPTT: 35 seconds (ref 24–36)

## 2022-10-17 LAB — BRAIN NATRIURETIC PEPTIDE: B Natriuretic Peptide: 498.5 pg/mL — ABNORMAL HIGH (ref 0.0–100.0)

## 2022-10-17 MED ORDER — ASPIRIN 81 MG PO TBEC: 81.0000 mg | DELAYED_RELEASE_TABLET | Freq: Every day | ORAL | Status: DC

## 2022-10-17 MED ORDER — METOPROLOL TARTRATE 5 MG/5ML IV SOLN: 2.5000 mg | INTRAVENOUS | Status: DC | PRN

## 2022-10-17 MED ORDER — ACETAMINOPHEN 325 MG PO TABS: 650.0000 mg | ORAL_TABLET | Freq: Four times a day (QID) | ORAL | Status: DC | PRN

## 2022-10-17 MED ORDER — INSULIN ASPART 100 UNIT/ML IJ SOLN
0.0000 [IU] | Freq: Three times a day (TID) | INTRAMUSCULAR | Status: DC
  Administered 2022-10-18 – 2022-10-19 (×2): 2 [IU] via SUBCUTANEOUS
  Administered 2022-10-19: 1 [IU] via SUBCUTANEOUS
  Administered 2022-10-19 – 2022-10-20 (×2): 2 [IU] via SUBCUTANEOUS
  Filled 2022-10-17 (×4): qty 1

## 2022-10-17 MED ORDER — DILTIAZEM HCL-DEXTROSE 125-5 MG/125ML-% IV SOLN (PREMIX)
2.0000 mg/h | INTRAVENOUS | Status: DC
  Administered 2022-10-18: 5 mg/h via INTRAVENOUS
  Filled 2022-10-17: qty 125

## 2022-10-17 MED ORDER — MORPHINE SULFATE (PF) 2 MG/ML IV SOLN: 2.0000 mg | INTRAVENOUS | Status: DC | PRN

## 2022-10-17 MED ORDER — METOPROLOL TARTRATE 5 MG/5ML IV SOLN
5.0000 mg | Freq: Once | INTRAVENOUS | Status: AC
  Administered 2022-10-17: 5 mg via INTRAVENOUS
  Filled 2022-10-17: qty 5

## 2022-10-17 MED ORDER — TAMSULOSIN HCL 0.4 MG PO CAPS
0.8000 mg | ORAL_CAPSULE | Freq: Every day | ORAL | Status: DC
  Administered 2022-10-18 – 2022-10-20 (×3): 0.8 mg via ORAL
  Filled 2022-10-17 (×3): qty 2

## 2022-10-17 MED ORDER — FUROSEMIDE 10 MG/ML IJ SOLN
40.0000 mg | Freq: Once | INTRAMUSCULAR | Status: AC
  Administered 2022-10-17: 40 mg via INTRAVENOUS
  Filled 2022-10-17: qty 4

## 2022-10-17 MED ORDER — ONDANSETRON HCL 4 MG PO TABS: 4.0000 mg | ORAL_TABLET | Freq: Four times a day (QID) | ORAL | Status: DC | PRN

## 2022-10-17 MED ORDER — ONDANSETRON HCL 4 MG/2ML IJ SOLN: 4.0000 mg | Freq: Four times a day (QID) | INTRAMUSCULAR | Status: DC | PRN

## 2022-10-17 MED ORDER — ACETAMINOPHEN 650 MG RE SUPP: 650.0000 mg | Freq: Four times a day (QID) | RECTAL | Status: DC | PRN

## 2022-10-17 MED ORDER — IOHEXOL 300 MG/ML  SOLN
100.0000 mL | Freq: Once | INTRAMUSCULAR | Status: AC | PRN
  Administered 2022-10-17: 100 mL via INTRAVENOUS

## 2022-10-17 NOTE — ED Notes (Signed)
Patient transported to CT 

## 2022-10-17 NOTE — Assessment & Plan Note (Deleted)
Fall precautions. Physical therapy assessment. Once stable orthostatic BP.

## 2022-10-17 NOTE — ED Provider Notes (Signed)
[facility] Provider Note    Event Date/Time   First MD Initiated Contact with Patient 10/17/22 1420     (approximate)   History   Shortness of Breath   HPI  Herbert Phillips is a 89 y.o. male with past medical history of hypertension, bladder cancer in remission, sleep apnea, type 2 diabetes who presents with shortness of breath and weakness.  Since Monday, over the last several day patient has been progressively short of breath.  He says when he is not moving around he is okay but when he does minimal he gets very short of breath.  Denies chest pain.  He did have a fall on Monday when he was in the bathroom he fell backwards hitting his head and upper back.  Denies any new neck or back pain or headache.  Denies vomiting denies abdominal pain.  Is constipated has not had a bowel movement in about 5 days which is atypical for him.  Denies numbness tingling weakness in his arms or legs.     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   Cancer of overlapping sites of bladder (HCC) 05/30/2016   Acute deep vein thrombosis (DVT) of left lower extremity (HCC) 02/23/2016   Closed displaced subtrochanteric fracture of left femur (HCC) 02/04/2016   Diabetes (HCC) 09/14/2013   Dyslipidemia 09/14/2013   HTN (hypertension) 09/14/2013   Sleep apnea 09/14/2013   Diabetes mellitus (HCC) 09/14/2013     Physical Exam  Triage Vital Signs: ED Triage Vitals  Enc Vitals Group     BP 10/17/22 1407 101/65     Pulse Rate 10/17/22 1407 (!) 105     Resp 10/17/22 1407 (!) 22     Temp 10/17/22 1407 97.6  F (36.4 C)     Temp Source 10/17/22 1407 Oral     SpO2 10/17/22 1407 95 %     Weight --      Height --      Head Circumference --      Peak Flow --      Pain Score 10/17/22 1337 0     Pain Loc --      Pain Edu? --      Excl. in GC? --     Most recent vital signs: Vitals:   10/17/22 2000 10/17/22 2030  BP: 125/75 114/78  Pulse: (!) 121 81  Resp: (!) 21 18  Temp:    SpO2: 97% 92%     General: Awake, chronically ill-appearing mildly dyspneic CV:  Good peripheral perfusion.  1+ pitting edema bilateral lower extremities Resp:  Patient is tachypneic but able to speak in full sentences there is some accessory muscle use, decreased air entry throughout with some bibasilar Rales Abd:  Abdomen is distended but nontender and soft Neuro:             Awake, Alert, Oriented x 3  Other:  No midline C, T or L-spine tenderness Ecchymosis over the right flank and right scapular area   ED Results /  Procedures / Treatments  Labs (all labs ordered are listed, but only abnormal results are displayed) Labs Reviewed  CBC - Abnormal; Notable for the following components:      Result Value   WBC 12.7 (*)    All other components within normal limits  COMPREHENSIVE METABOLIC PANEL - Abnormal; Notable for the following components:   Glucose, Bld 109 (*)    BUN 41 (*)    Calcium 8.5 (*)    Albumin 3.0 (*)    All other components within normal limits  BRAIN NATRIURETIC PEPTIDE - Abnormal; Notable for the following components:   B Natriuretic Peptide 498.5 (*)    All other components within normal limits  PROTIME-INR - Abnormal; Notable for the following components:   Prothrombin Time 15.8 (*)    INR 1.3 (*)    All other components within normal limits  APTT  URINALYSIS, ROUTINE W REFLEX MICROSCOPIC  TROPONIN I (HIGH SENSITIVITY)  TROPONIN I (HIGH SENSITIVITY)     EKG  EKG reviewed interpreted myself shows A-fib with PVCs no acute ischemic changes normal axis   RADIOLOGY I  reviewed interpreted the chest x-ray which shows bibasilar effusions   PROCEDURES:  Critical Care performed: Yes, see critical care procedure note(s)  Procedures  The patient is on the cardiac monitor to evaluate for evidence of arrhythmia and/or significant heart rate changes.   MEDICATIONS ORDERED IN ED: Medications  iohexol (OMNIPAQUE) 300 MG/ML solution 100 mL (100 mLs Intravenous Contrast Given 10/17/22 1936)  metoprolol tartrate (LOPRESSOR) injection 5 mg (5 mg Intravenous Given 10/17/22 2047)     IMPRESSION / MDM / ASSESSMENT AND PLAN / ED COURSE  I reviewed the triage vital signs and the nursing notes.                              Patient's presentation is most consistent with acute presentation with potential threat to life or bodily function.  Differential diagnosis includes, but is not limited to, CHF exacerbation, pneumonia, PE, pneumothorax, pleural effusion  The patient is a 89 year old male who presents with shortness of breath and weakness.  He did have a fall about 5 days ago which she fell backward hitting his head and back.  Denies any significant pain.  Has been dyspneic over the last 7 days no cough or chest pain feels short of breath with minimal movement at home.  Patient's vital signs are notable for tachycardia and A-fib rates ranging from the 50s to 1 teens.  He is hypoxic requiring 3 L nasal cannula here.  He looks somewhat chronically ill but not toxic.  Does have some pitting edema in the lower extremities and some rales on lung exam.  He also has some ecchymosis in the right flank and upper back.  Chest x-ray looks like possible CHF with pleural effusions and/or atelectasis.  Given the fall with head strike will obtain CT head and C-spine.  Will obtain labs including BNP and troponin.  Anticipate admission given his hypoxic respiratory failure.  Patient CT head and C-spine are concerning for multiple left-sided rib fractures as well as small pneumothorax  and possible hemothorax.  No acute injury to the head or neck.  Will obtain a CT chest abdomen pelvis.  Informed patient and daughter of results.  Patient was having some difficulty urinating.  Bladder scan shows 200 cc in the bladder.  Will hold off on straight cath.  I did confirm he has  good strength in bilateral upper and lower extremities do not feel this represents any cord compression.  CT of the chest abdomen pelvis shows a superior endplate deformity of T12 that is acute.  Patient does have pleural effusions but does not look like hemothorax.  Does have posterior left third and fourth rib fractures as well as a trace pneumothorax and small amount of anterior pneumomediastinum.  Patient is tachycardic in A-fib with rates now in the 120s will give IV metoprolol.  Plan to discuss with trauma given patient's age and comorbidities fact that he is hypoxic he is at high risk for deterioration . I did also discuss with our neurosurgeon who does not recommend any acute intervention for his spine.  Can have TLSO brace as needed but given his kyphosis and lung issues it is unlikely to be of benefit.  I spoke with Dr. Sophronia Simas at [facility] trauma who did not feel that the patient necessarily needed to be transferred to a trauma center.  She says that if patient were to come to [facility] she would recommend admission to the hospitalist service.  Plan to speak with our hospitalist to see if they are willing to admit him here.       FINAL CLINICAL IMPRESSION(S) / ED DIAGNOSES   Final diagnoses:  Closed fracture of multiple ribs of left side, initial encounter  Compression fracture of T12 vertebra, initial encounter (HCC)  Atrial fibrillation with RVR (HCC)     Rx / DC Orders   ED Discharge Orders     None        Note:  This document was prepared using Dragon voice recognition software and may include unintentional dictation errors.   Georga Hacking, MD 10/17/22 2122

## 2022-10-17 NOTE — ED Triage Notes (Addendum)
Pt comes via EMs from [facility] of [place] with c/o sob. Pt fell week ago and has been declining since. Pt has bruise to top of back. HR-149 90% RA pt placed on 3L and now at 95%  Sob started few days ago and some generalized weakness. Pt having issues with his afib per family.

## 2022-10-17 NOTE — ED Notes (Signed)
[facility] called spoke with [facility] for trauma transfere per Dr.Mchugh

## 2022-10-17 NOTE — Progress Notes (Signed)
Neurosurgery brief note Was contacted by the emergency room guarding this patient. 89 y.o. male with past medical history of hypertension, bladder cancer in remission, sleep apnea, type 2 diabetes who presents with shortness of breath and weakness  No back pain.  He had undergone CT scan of the chest which demonstrated multiple levels of thoracic compression fracture of old age for which neurosurgery was consulted. By report he is neurologically intact though with thoracic kyphotic deformity longstanding.  He is having difficulty with breathing dyspnea and shortness of breath for which he is going to be admitted to the medicine service.  CT THORACIC SPINE FINDINGS   Alignment: Exaggerated thoracic kyphosis.   Vertebral bodies: Acute superior endplate deformity of T12 (series 5, image 62). Unchanged severe anterior wedge deformity of T8 (series 5, image 60). Age indeterminate anterior wedge deformity of T2 (series 5, image 60).   Disc spaces: Intact.   Paraspinous soft tissues: Paraspinous soft tissue stranding about T12 without evidence of discrete fluid collection or epidural hematoma (series 1, image 36).   IMPRESSION: 1. Tiny, less than 5% left pneumothorax. 2. Small volume anterior pneumomediastinum. 3. Moderate left, small right pleural effusions and associated atelectasis or consolidation. 4. Acute superior endplate deformity of T12. Unchanged severe anterior wedge deformity of T8. Age indeterminate anterior wedge deformity of T2. 5. Paraspinous soft tissue stranding about T12 without evidence of discrete fluid collection or epidural hematoma. 6. Very subtle, nondisplaced fractures of the posterior left third and fourth ribs 7. Subacute fractures of the anterior left seventh and eighth ribs, without acute displaced rib fracture 8. No CT evidence of acute traumatic injury to the organs of the abdomen or pelvis. 9. Emphysema. 10. Coronary artery disease.   AP: The patient  has previous severe wedge deformities of T8 indeterminant wedge deformity of T2 and what appears to be an endplate fracture of T12.  He has no back pain and is neurologically intact per the emergency room.  He is presenting with dyspnea and shortness of breath is a primary complaint.  While he could have a TLSO brace when out of bed for comfort as needed for mobilization I think this could be held as needed to prevent any constriction to his ability to breathe or maintain normal lung volumes given his kyphosis and his presenting dyspnea.  Otherwise the patient can be followed clinically and we will arrange neurosurgery outpatient follow-up  Peter Garter. Aminta Sakurai MD Neurosurgery

## 2022-10-17 NOTE — H&P (Signed)
History and Physical     Patient: Herbert Phillips ZOX:[id number] DOB: 1925-10-23 DOA: 10/17/2022 DOS: the patient was seen and examined on 10/18/2022 PCP: Lauro Regulus, MD   Patient coming from: [facility] Chief Complaint: Fall  HISTORY OF PRESENT ILLNESS: Herbert Phillips is an 89 y.o. male seen in the emergency room brought by EMS from [facility] [place] complaints of shortness of breath.  Patient had a fall on Monday and during fall patient hit his head but did not have any loss of consciousness.  Since his fall he is becoming progressively weak short of breath.  Patient notices dyspnea on exertion.  No incontinence headaches vision or speech changes no paresthesias.  No seizures.  On initial triage patient's heart rate was 149 and he was hypoxic at 90% on room air and started on 3 L of oxygen with improvement to 95%.daughter in law ms Herbert Phillips states the fall and trauma was on Monday, today is Friday putting [place] about 5 days out. Chart did shoe pt had hematuria from bladder cancer.  No h/o bleed .  Past Medical History:  Diagnosis Date   Arthritis    B12 deficiency    Bladder cancer (HCC)    2 yrs ago   Diabetes (HCC) 09/14/2013   Dysrhythmia    Bradycardia   Hip fracture, left (HCC) 02/04/2016   Hypertension    Non-insulin dependent type 2 diabetes mellitus (HCC)    Sleep apnea    Review of Systems  Neurological:  Positive for weakness.   No Known Allergies Past Surgical History:  Procedure Laterality Date   cyst on spine  1961   EAR CYST EXCISION     INTRAMEDULLARY (IM) NAIL INTERTROCHANTERIC Left 02/04/2016   Procedure: INTRAMEDULLARY (IM) NAIL INTERTROCHANTRIC;  Surgeon: Christena Flake, MD;  Location: [facility] ORS;  Service: Orthopedics;  Laterality: Left;   TRANSURETHRAL RESECTION OF BLADDER TUMOR N/A 10/24/2014   Procedure: TRANSURETHRAL RESECTION OF BLADDER TUMOR (TURBT);  Surgeon: Orson Ape, MD;  Location: [facility] ORS;  Service: Urology;   Laterality: N/A;   MEDICATIONS: Prior to Admission medications   Medication Sig Start Date End Date Taking? Authorizing Provider  allopurinol (ZYLOPRIM) 100 MG tablet Take 100 mg by mouth daily. 01/13/18   [provider]  aspirin EC 81 MG tablet Take 81 mg by mouth daily.     [provider]  bacitracin ointment Apply topically 2 (two) times daily. 02/07/16   Hower, Cletis Athens, MD  bacitracin ophthalmic ointment USE A SMALL AMOUNT IN LEFT EYE AFTER INJECTION AS NEEDED 01/18/19   [provider]  Cholecalciferol (VITAMIN D PO) Take by mouth.    [provider]  docusate sodium (COLACE) 100 MG capsule Take 1 capsule (100 mg total) by mouth 2 (two) times daily. 02/07/16   Hower, Cletis Athens, MD  FREESTYLE LITE test strip USE 1 STRIP TO CHECK GLUCOSE TWICE DAILY 01/04/18   [provider]  furosemide (LASIX) 20 MG tablet Take 20 mg by mouth as needed.  04/02/16   [provider]  Glucosamine Sulfate (GLUCOSAMINE RELIEF) 1000 MG TABS Take 1,000 mg by mouth daily.     [provider]  Lancets (FREESTYLE) lancets USE TO TEST BLOOD SUGAR ONCE DAILY 02/13/21   [provider]  lovastatin (MEVACOR) 20 MG tablet Take 20 mg by mouth daily at 6 PM.  12/28/14   [provider]  meclizine (ANTIVERT) 12.5 MG tablet TAKE 1 TABLET BY MOUTH EVERY 8  HOURS AS NEEDED FOR DIZZINESS 11/28/18   [provider]  metFORMIN (GLUCOPHAGE) 500 MG tablet Take 500 mg by mouth 2 (two) times daily with a meal.  12/13/14   [provider]  ramipril (ALTACE) 2.5 MG capsule Take 2.5 mg by mouth daily.    [provider]  traZODone (DESYREL) 50 MG tablet Take by mouth. 03/18/16   [provider]  vitamin B-12 (CYANOCOBALAMIN) 1000 MCG tablet Take 1,000 mcg by mouth daily.    [provider]   ED Course: Pt in Ed is oriented.  Vitals:   10/17/22 2300 10/18/22 0030 10/18/22 0100 10/18/22 0130  BP: 109/62 110/88 108/71 107/66   Pulse: (!) 110 86 (!) 53 100  Resp: (!) 25 18 (!) 27 (!) 28  Temp:      TempSrc:      SpO2: 93% 95% 93% 93%  In the emergency room patient found to be in A-fib RVR with heart rate between 120s and 140s and was given IV metoprolol. No intake/output data recorded. SpO2: 93 % Blood work in ed shows: CMP shows normal electrolytes normal kidney function normal LFTs glucose of 109. BNP of 498.5, no previous to compare. Troponin of 13 and repeat troponin of 11. CBC shows leukocytosis of 12.7 normal hemoglobin and normal platelet counts. INR of 1.3. CT imaging of chest abdomen and pelvis with CT thoracic spine with contrast shows a very small less than 5% left pneumothorax with small volume anterior pneumomediastinum.  See complete report: 1. Tiny, less than 5% left pneumothorax. 2. Small volume anterior pneumomediastinum. 3. Moderate left, small right pleural effusions and associated atelectasis or consolidation. 4. Acute superior endplate deformity of T12. Unchanged severe anterior wedge deformity of T8. Age indeterminate anterior wedge deformity of T2. 5. Paraspinous soft tissue stranding about T12 without evidence of discrete fluid collection or epidural hematoma. 6. Very subtle, nondisplaced fractures of the posterior left third and fourth ribs 7. Subacute fractures of the anterior left seventh and eighth ribs, without acute displaced rib fracture 8. No CT evidence of acute traumatic injury to the organs of the abdomen or pelvis. 9. Emphysema. 10. Coronary artery disease. Admission requested for A-fib with RVR, physical therapy, pain control.  Results for orders placed or performed during the hospital encounter of 10/17/22 (from the past 72 hour(s))  CBC     Status: Abnormal   Collection Time: 10/17/22  2:39 PM  Result Value Ref Range   WBC 12.7 (H) 4.0 - 10.5 K/uL   RBC 4.37 4.22 - 5.81 MIL/uL   Hemoglobin 13.8 13.0 - 17.0 g/dL   HCT 16.1 09.6 - 04.5 %   MCV 93.8 80.0 -  100.0 fL   MCH 31.6 26.0 - 34.0 pg   MCHC 33.7 30.0 - 36.0 g/dL   RDW 40.9 81.1 - 91.4 %   Platelets 185 150 - 400 K/uL   nRBC 0.0 0.0 - 0.2 %    Comment: Performed at [facility], [address], [place], [place] [address]  Comprehensive metabolic panel     Status: Abnormal   Collection Time: 10/17/22  4:54 PM  Result Value Ref Range   Sodium 137 135 - 145 mmol/L   Potassium 3.9 3.5 - 5.1 mmol/L   Chloride 104 98 - 111 mmol/L   CO2 22 22 - 32 mmol/L   Glucose, Bld 109 (H) 70 - 99 mg/dL    Comment: Glucose reference range applies only to samples taken after fasting for at least  8 hours.   BUN 41 (H) 8 - 23 mg/dL   Creatinine, Ser 4.09 0.61 - 1.24 mg/dL   Calcium 8.5 (L) 8.9 - 10.3 mg/dL   Total Protein 6.6 6.5 - 8.1 g/dL   Albumin 3.0 (L) 3.5 - 5.0 g/dL   AST 20 15 - 41 U/L   ALT 17 0 - 44 U/L   Alkaline Phosphatase 64 38 - 126 U/L   Total Bilirubin 0.7 0.3 - 1.2 mg/dL   GFR, Estimated >81 >19 mL/min    Comment: (NOTE) Calculated using the CKD-EPI Creatinine Equation (2021)    Anion gap 11 5 - 15    Comment: Performed at [facility], [address] [place], [place] [address]  Troponin I (High Sensitivity)     Status: None   Collection Time: 10/17/22  4:54 PM  Result Value Ref Range   Troponin I (High Sensitivity) 13 <18 ng/L    Comment: (NOTE) Elevated high sensitivity troponin I (hsTnI) values and significant  changes across serial measurements may suggest ACS but many other  chronic and acute conditions are known to elevate hsTnI results.  Refer to the "Links" section for chest pain algorithms and additional  guidance. Performed at [facility], [address] [place], [place] [address]   Brain natriuretic peptide     Status: Abnormal   Collection Time: 10/17/22  4:54 PM  Result Value Ref Range   B Natriuretic Peptide 498.5 (H) 0.0 - 100.0 pg/mL    Comment: Performed at [facility], [address] [place], [place]  [address]  Urinalysis, Routine w reflex microscopic -Urine, Clean Catch     Status: Abnormal   Collection Time: 10/17/22  7:53 PM  Result Value Ref Range   Color, Urine YELLOW (A) YELLOW   APPearance CLEAR (A) CLEAR   Specific Gravity, Urine 1.023 1.005 - 1.030   pH 5.0 5.0 - 8.0   Glucose, UA NEGATIVE NEGATIVE mg/dL   Hgb urine dipstick NEGATIVE NEGATIVE   Bilirubin Urine NEGATIVE NEGATIVE   Ketones, ur 5 (A) NEGATIVE mg/dL   Protein, ur NEGATIVE NEGATIVE mg/dL   Nitrite NEGATIVE NEGATIVE   Leukocytes,Ua NEGATIVE NEGATIVE    Comment: Performed at [facility], [address], [place], [place] [address]  Protime-INR     Status: Abnormal   Collection Time: 10/17/22  7:56 PM  Result Value Ref Range   Prothrombin Time 15.8 (H) 11.4 - 15.2 seconds   INR 1.3 (H) 0.8 - 1.2    Comment: (NOTE) INR goal varies based on device and disease states. Performed at [facility], [address] [place], [place] [address]   APTT     Status: None   Collection Time: 10/17/22  7:56 PM  Result Value Ref Range   aPTT 35 24 - 36 seconds    Comment: Performed at [facility], [address] [place], [place] [address]  Troponin I (High Sensitivity)     Status: None   Collection Time: 10/17/22  7:56 PM  Result Value Ref Range   Troponin I (High Sensitivity) 11 <18 ng/L    Comment: (NOTE) Elevated high sensitivity troponin I (hsTnI) values and significant  changes across serial measurements may suggest ACS but many other  chronic and acute conditions are known to elevate hsTnI results.  Refer to the "Links" section for chest pain algorithms and additional  guidance. Performed at [facility], [address], [place], [place] [address]  Lab Results  Component Value Date   CREATININE 1.07 10/17/2022   CREATININE 1.14 08/27/2021   CREATININE 0.87 06/08/2020      Latest Ref Rng & Units 10/17/2022    4:54 PM 08/27/2021    1:04 PM 06/08/2020    9:25 AM  CMP   Glucose 70 - 99 mg/dL 295  621  308   BUN 8 - 23 mg/dL 41  34  26   Creatinine 0.61 - 1.24 mg/dL 6.57  8.46  9.62   Sodium 135 - 145 mmol/L 137  139  138   Potassium 3.5 - 5.1 mmol/L 3.9  4.9  4.2   Chloride 98 - 111 mmol/L 104  109  100   CO2 22 - 32 mmol/L 22  24  28    Calcium 8.9 - 10.3 mg/dL 8.5  8.4  9.2   Total Protein 6.5 - 8.1 g/dL 6.6   7.3   Total Bilirubin 0.3 - 1.2 mg/dL 0.7   0.7   Alkaline Phos 38 - 126 U/L 64   129   AST 15 - 41 U/L 20   20   ALT 0 - 44 U/L 17   19    Unresulted Labs (From admission, onward)     Start     Ordered   10/18/22 0500  Comprehensive metabolic panel  Tomorrow morning,   R        10/17/22 2340   10/18/22 0500  CBC with Differential/Platelet  Tomorrow morning,   R        10/17/22 2340   10/18/22 0500  Phosphorus  Tomorrow morning,   R        10/17/22 2340   10/18/22 0500  Hemoglobin A1c  (Glycemic Control (SSI)  Q 4 Hours / Glycemic Control (SSI)  AC +/- HS)  Once,   URGENT       Comments: To assess prior glycemic control    10/18/22 0137   10/18/22 0500  Magnesium  Add-on,   AD        10/18/22 0137   10/18/22 0500  T4, free  (Atrial Fibrillation / Flutter)  Once,   URGENT        10/18/22 0137   10/18/22 0500  TSH  (Atrial Fibrillation / Flutter)  Once,   URGENT        10/18/22 0137   10/18/22 0144  APTT  ONCE - STAT,   R        10/18/22 0143   10/18/22 0108  Blood gas, venous  ONCE - STAT,   R        10/18/22 0107           Pt has received : Orders Placed This Encounter  Procedures   DG Chest 2 View    If patient pregnant, contact provider.    Standing Status:   Standing    Number of Occurrences:   1    Order Specific Question:   Reason for Exam (SYMPTOM  OR DIAGNOSIS REQUIRED)    Answer:   sob    Order Specific Question:   Radiology Contrast Protocol - do NOT remove file path    Answer:   [email or web address]   CT HEAD WO CONTRAST ( )    Standing Status:   Standing     Number of Occurrences:   1   CT Cervical Spine Wo Contrast    Standing Status:   Standing    Number of Occurrences:   1  CT CHEST ABDOMEN PELVIS W CONTRAST    Standing Status:   Standing    Number of Occurrences:   1    Order Specific Question:   Does the patient have a contrast media/X-ray dye allergy?    Answer:   No    Order Specific Question:   If indicated for the ordered procedure, I authorize the administration of oral contrast media per Radiology protocol    Answer:   Yes   CT T-SPINE NO CHARGE    Standing Status:   Standing    Number of Occurrences:   1   CBC    Standing Status:   Standing    Number of Occurrences:   1   Comprehensive metabolic panel    Standing Status:   Standing    Number of Occurrences:   1   Brain natriuretic peptide    Standing Status:   Standing    Number of Occurrences:   1   Urinalysis, Routine w reflex microscopic -Urine, Clean Catch    Standing Status:   Standing    Number of Occurrences:   1    Order Specific Question:   Specimen Source    Answer:   Urine, Clean Catch [76]   Protime-INR    Standing Status:   Standing    Number of Occurrences:   1   APTT    Standing Status:   Standing    Number of Occurrences:   1   Comprehensive metabolic panel    Standing Status:   Standing    Number of Occurrences:   1   CBC with Differential/Platelet    Standing Status:   Standing    Number of Occurrences:   1   Phosphorus    Standing Status:   Standing    Number of Occurrences:   1   Blood gas, venous    Standing Status:   Standing    Number of Occurrences:   1   Hemoglobin A1c    To assess prior glycemic control    Standing Status:   Standing    Number of Occurrences:   1   Magnesium    Standing Status:   Standing    Number of Occurrences:   1   T4, free    Standing Status:   Standing    Number of Occurrences:   1   TSH    Standing Status:   Standing    Number of Occurrences:   1   APTT    Standing Status:   Standing    Number  of Occurrences:   1   Diet heart healthy/carb modified Room service appropriate? Yes; Fluid consistency: Thin    Standing Status:   Standing    Number of Occurrences:   1    Order Specific Question:   Diet-HS Snack?    Answer:   Nothing    Order Specific Question:   Room service appropriate?    Answer:   Yes    Order Specific Question:   Fluid consistency:    Answer:   Thin   Document Height and Actual Weight    Use scales to weigh patient, not stated or estimated weight.    Standing Status:   Standing    Number of Occurrences:   1   Notify physician (specify)    Standing Status:   Standing    Number of Occurrences:   1    Order Specific Question:   Notify  Physician    Answer:   HR<60 or >130    Order Specific Question:   Notify Physician    Answer:   SBP <90    Order Specific Question:   Notify Physician    Answer:   Pauses >2 sec    Order Specific Question:   Notify Physician    Answer:   new arrhythmia    Order Specific Question:   Notify Physician    Answer:   return to SR    Order Specific Question:   Notify Physician    Answer:   signs & symptoms of MI   Strict intake and output    Standing Status:   Standing    Number of Occurrences:   1   Target HR: 65-105 bpm with return to baseline rhythm and hemodynamic stability    with return to baseline rhythm and hemodynamic stability    Standing Status:   Standing    Number of Occurrences:   1    Order Specific Question:   Target HR    Answer:   65-105 bpm   Apply Atrial Arrhythmia Care Plan    Standing Status:   Standing    Number of Occurrences:   1   STAT CBG when hypoglycemia is suspected. If treated, recheck every 15 minutes after each treatment until CBG >/= 70 mg/dl    Standing Status:   Standing    Number of Occurrences:   1   Refer to Hypoglycemia Protocol Sidebar Report for treatment of CBG < 70 mg/dl    Standing Status:   Standing    Number of Occurrences:   1   Cardiac Monitoring - Continuous Indefinite     Standing Status:   Standing    Number of Occurrences:   1    Order Specific Question:   Indications for use:    Answer:   ICU/Stepdown patient   Vital signs    Standing Status:   Standing    Number of Occurrences:   1   Notify physician (specify)    Standing Status:   Standing    Number of Occurrences:   20    Order Specific Question:   Notify Physician    Answer:   for pulse less than 55 or greater than 120    Order Specific Question:   Notify Physician    Answer:   for respiratory rate less than 12 or greater than 25    Order Specific Question:   Notify Physician    Answer:   for temperature greater than 100.5 F    Order Specific Question:   Notify Physician    Answer:   for urinary output less than 30 mL/hr for four hours    Order Specific Question:   Notify Physician    Answer:   for systolic BP less than 90 or greater than 160, diastolic BP less than 60 or greater than 100   Progressive Mobility Protocol: No Restrictions    Standing Status:   Standing    Number of Occurrences:   1   Do not place and if present remove PureWick    Standing Status:   Standing    Number of Occurrences:   1   Initiate Oral Care Protocol    Standing Status:   Standing    Number of Occurrences:   1   Initiate Carrier Fluid Protocol    Standing Status:   Standing    Number of Occurrences:  1   RN may order General Admission PRN Orders utilizing "General Admission PRN medications" (through manage orders) for the following patient needs: allergy symptoms (Claritin), cold sores (Carmex), cough (Robitussin DM), eye irritation (Liquifilm Tears), hemorrhoids (Tucks), indigestion (Maalox), minor skin irritation (Hydrocortisone Cream), muscle pain Romeo Apple Gay), nose irritation (saline nasal spray) and sore throat (Chloraseptic spray).    Standing Status:   Standing    Number of Occurrences:   99999   Daily weights    Standing Status:   Standing    Number of Occurrences:   1   SCDs    Standing Status:    Standing    Number of Occurrences:   1    Order Specific Question:   Laterality    Answer:   Bilateral   Bladder scan    To check for urine retention, call if > .    Standing Status:   Standing    Number of Occurrences:   1   Intake and Output    Standing Status:   Standing    Number of Occurrences:   1   Neuro checks    Standing Status:   Standing    Number of Occurrences:   1   Swallow screen    Standing Status:   Standing    Number of Occurrences:   1   No HS correction Insulin    Standing Status:   Standing    Number of Occurrences:   1   Target HR: 80-100 (usual 65-105) with return to baseline rhythm and rate with hemodynamic stability.    (usual 65-105) with return to baseline rhythm and rate with hemodynamic stability.    Standing Status:   Standing    Number of Occurrences:   1    Order Specific Question:   Target HR    Answer:   80-100   Notify physician (specify)    Standing Status:   Standing    Number of Occurrences:   20   Cardiac monitoring while on diltiazem infusion    Standing Status:   Standing    Number of Occurrences:   1   Measure blood pressure q 15 min x 4, then q 30 min until at a stable dose or rhythm control achieved, then q 4 hr or more frequently to meet unit VS standard    Standing Status:   Standing    Number of Occurrences:   1   Document cardiac rate & rhythm     Standing Status:   Standing    Number of Occurrences:   1   document strip     And with any changes in ECG rhythm    Standing Status:   Standing    Number of Occurrences:   1   Discontinue diltiazem infusion immediately for HR < 60 BPM, SBP < , pauses > 2 seconds, or NEW AV block, or signs and symptoms of acute myocardial infarction    .    Standing Status:   Standing    Number of Occurrences:   1   No bolus    Standing Status:   Standing    Number of Occurrences:   1   Full code    Standing Status:   Standing    Number of Occurrences:   1    Order Specific  Question:   By:    Answer:   Other   Consult to hospitalist    Standing Status:   Standing  Number of Occurrences:   1    Order Specific Question:   Place call to:    Answer:   [phone]    Order Specific Question:   Reason for Consult    Answer:   Admit    Order Specific Question:   Diagnosis/Clinical Info for Consult:    Answer:   hypxoa, rib fractures   Inpatient consult to Cardiology [facility] only - Select consulting group: [facility]; Consult Timeframe: ROUTINE - requires response within 24 hours; Reason for Consult? A.FIB RVR.    Standing Status:   Standing    Number of Occurrences:   1    Order Specific Question:   [facility] only - Select consulting group    Answer:   Gavin Potters    Order Specific Question:   Consult Timeframe    Answer:   ROUTINE - requires response within 24 hours    Order Specific Question:   Reason for Consult?    Answer:   A.FIB RVR.   Consult to respiratory care treatment    Standing Status:   Standing    Number of Occurrences:   1    Order Specific Question:   Reason for Consult?    Answer:   respiratory failure may need BIPAP.   heparin per pharmacy consult    Standing Status:   Standing    Number of Occurrences:   1    Order Specific Question:   Indication:    Answer:   Atrial Fibrillation    Order Specific Question:   Comment:    Answer:   pt came in with .afib rvr and no anticoagulation fro h/o dvt d/w family about heparin drip nad agrees with risk of bleeding. unless any  absolute contraindication please stat heaprin drip with  no bolus . thanks   Pulse oximetry check with vital signs    Standing Status:   Standing    Number of Occurrences:   1   Oxygen therapy Mode or (Route): Nasal cannula; Liters Per Minute: 2; Keep 02 saturation: greater than 92 %    Standing Status:   Standing    Number of Occurrences:   1    Order Specific Question:   Mode or (Route)    Answer:   Nasal cannula    Order Specific Question:   Liters Per Minute    Answer:   2     Order Specific Question:   Keep 02 saturation    Answer:   greater than 92 %   ED EKG    Standing Status:   Standing    Number of Occurrences:   1    Order Specific Question:   Reason for Exam    Answer:   Shortness of breath   EKG 12-lead    Standing Status:   Standing    Number of Occurrences:   20    Order Specific Question:   Notes    Answer:   atrial fibrillation / atrial flutter   Admit to Inpatient (patient's expected length of stay will be greater than 2 midnights or inpatient only procedure)    Standing Status:   Standing    Number of Occurrences:   1    Order Specific Question:   Hospital Area    Answer:   [facility] [[id number]]    Order Specific Question:   Level of Care    Answer:   Telemetry Medical [104]    Order Specific Question:   Covid Evaluation  Answer:   Asymptomatic - no recent exposure (last 10 days) testing not required    Order Specific Question:   Diagnosis    Answer:   Fall [290176]    Order Specific Question:   Admitting Physician    Answer:   Darrold Junker    Order Specific Question:   Attending Physician    Answer:   Darrold Junker    Order Specific Question:   Certification:    Answer:   I certify this patient will need inpatient services for at least 2 midnights    Order Specific Question:   Estimated Length of Stay    Answer:   2   Admit to Inpatient (patient's expected length of stay will be greater than 2 midnights or inpatient only procedure)    Standing Status:   Standing    Number of Occurrences:   1    Order Specific Question:   Hospital Area    Answer:   [facility] [[id number]]    Order Specific Question:   Level of Care    Answer:   Stepdown [14]    Order Specific Question:   Covid Evaluation    Answer:   Asymptomatic - no recent exposure (last 10 days) testing not required    Order Specific Question:   Diagnosis    Answer:   Fall [290176]    Order Specific Question:   Admitting  Physician    Answer:   Darrold Junker    Order Specific Question:   Attending Physician    Answer:   Darrold Junker    Order Specific Question:   Certification:    Answer:   I certify this patient will need inpatient services for at least 2 midnights    Order Specific Question:   Estimated Length of Stay    Answer:   2    Meds ordered this encounter  Medications   iohexol (OMNIPAQUE) 300 MG/ML solution 100 mL   metoprolol tartrate (LOPRESSOR) injection 5 mg   furosemide (LASIX) injection 40 mg   tamsulosin (FLOMAX) capsule 0.8 mg   DISCONTD: aspirin EC tablet 81 mg   OR Linked Order Group    acetaminophen (TYLENOL) tablet 650 mg    acetaminophen (TYLENOL) suppository 650 mg   OR Linked Order Group    ondansetron (ZOFRAN) tablet 4 mg    ondansetron (ZOFRAN) injection 4 mg   DISCONTD: metoprolol tartrate (LOPRESSOR) injection 2.5 mg   insulin aspart (novoLOG) injection 0-9 Units    Order Specific Question:   Correction coverage:    Answer:   Sensitive (thin, NPO, renal)    Order Specific Question:   CBG < 70:    Answer:   Implement Hypoglycemia Standing Orders and refer to Hypoglycemia Standing Orders sidebar report    Order Specific Question:   CBG 70 - 120:    Answer:   0 units    Order Specific Question:   CBG 121 - 150:    Answer:   1 unit    Order Specific Question:   CBG 151 - 200:    Answer:   2 units    Order Specific Question:   CBG 201 - 250:    Answer:   3 units    Order Specific Question:   CBG 251 - 300:    Answer:   5 units    Order Specific Question:   CBG 301 - 350:  Answer:   7 units    Order Specific Question:   CBG 351 - 400    Answer:   9 units    Order Specific Question:   CBG > 400    Answer:   call MD and obtain STAT lab verification   morphine (PF) 2 MG/ML injection 2 mg   diltiazem (CARDIZEM) 125 mg in dextrose 5% 125 mL (1 mg/mL) infusion    Admission Imaging : CT CHEST ABDOMEN PELVIS W CONTRAST  Result Date:  10/17/2022 CLINICAL DATA:  Fall 1 week ago, shortness of breath, bruising to back EXAM: CT CHEST, ABDOMEN, AND PELVIS WITH CONTRAST CT THORACIC SPINE WITH CONTRAST TECHNIQUE: Multidetector CT imaging of the chest, abdomen and pelvis was performed following the standard protocol during bolus administration of intravenous contrast. Multidetector CT imaging of the thoracic spine was performed following the standard protocol during bolus administration of intravenous contrast. RADIATION DOSE REDUCTION: This exam was performed according to the departmental dose-optimization program which includes automated exposure control, adjustment of the mA and/or kV according to patient size and/or use of iterative reconstruction technique. CONTRAST:  OMNIPAQUE IOHEXOL 300 MG/ML  SOLN COMPARISON:  CT abdomen pelvis, 06/08/2020 chest radiographs, 10/05/2014 FINDINGS: CT CHEST FINDINGS Cardiovascular: Aortic atherosclerosis. Normal cardiomegaly. Three-vessel coronary artery calcifications. No pericardial effusion. Mediastinum/Nodes: No enlarged mediastinal, hilar, or axillary lymph nodes. Small volume anterior pneumomediastinum (series 2, image 42). Thyroid gland, trachea, and esophagus demonstrate no significant findings. Lungs/Pleura: Moderate left, small right pleural effusions and associated atelectasis or consolidation. Mild centrilobular emphysema. Tiny, less than 5% left pneumothorax (series 4, image 34). Musculoskeletal: No chest wall mass or suspicious osseous lesions identified. Very subtle, nondisplaced fractures of the posterior left third and fourth ribs, much better appreciated by comparison examination of the cervical spine (series 4, image 19). Subacute fractures of the anterior left seventh and eighth ribs, without acute displaced rib fracture (series 2, image 53, 59). CT ABDOMEN PELVIS FINDINGS Hepatobiliary: No solid liver abnormality is seen. No gallstones, gallbladder wall thickening, or biliary dilatation.  Pancreas: Unremarkable. No pancreatic ductal dilatation or surrounding inflammatory changes. Spleen: Normal in size without significant abnormality. Adrenals/Urinary Tract: Adrenal glands are unremarkable. Kidneys are normal, without renal calculi, solid lesion, or hydronephrosis. Bladder is unremarkable. Stomach/Bowel: Stomach is within normal limits. Appendix is normal. No evidence of bowel wall thickening, distention, or inflammatory changes. Vascular/Lymphatic: Aortic atherosclerosis. No enlarged abdominal or pelvic lymph nodes. Reproductive: No mass or other abnormality. Other: Large right inguinal hernia containing the cecal base, incompletely imaged (series 2, image 120) no ascites. Musculoskeletal: No acute osseous findings. CT THORACIC SPINE FINDINGS Alignment: Exaggerated thoracic kyphosis. Vertebral bodies: Acute superior endplate deformity of T12 (series 5, image 62). Unchanged severe anterior wedge deformity of T8 (series 5, image 60). Age indeterminate anterior wedge deformity of T2 (series 5, image 60). Disc spaces: Intact. Paraspinous soft tissues: Paraspinous soft tissue stranding about T12 without evidence of discrete fluid collection or epidural hematoma (series 1, image 36). IMPRESSION: 1. Tiny, less than 5% left pneumothorax. 2. Small volume anterior pneumomediastinum. 3. Moderate left, small right pleural effusions and associated atelectasis or consolidation. 4. Acute superior endplate deformity of T12. Unchanged severe anterior wedge deformity of T8. Age indeterminate anterior wedge deformity of T2. 5. Paraspinous soft tissue stranding about T12 without evidence of discrete fluid collection or epidural hematoma. 6. Very subtle, nondisplaced fractures of the posterior left third and fourth ribs 7. Subacute fractures of the anterior left seventh and eighth ribs, without acute displaced  rib fracture 8. No CT evidence of acute traumatic injury to the organs of the abdomen or pelvis. 9. Emphysema.  10. Coronary artery disease. These results were called by telephone at the time of interpretation on 10/17/2022 at 8:11 pm to Dr. Tresa Endo [facility] , who verbally acknowledged these results. Aortic Atherosclerosis (ICD10-I70.0) and Emphysema (ICD10-J43.9). Electronically Signed   By: Jearld Lesch M.D.   On: 10/17/2022 20:20   CT T-SPINE NO CHARGE  Result Date: 10/17/2022 CLINICAL DATA:  Fall 1 week ago, shortness of breath, bruising to back EXAM: CT CHEST, ABDOMEN, AND PELVIS WITH CONTRAST CT THORACIC SPINE WITH CONTRAST TECHNIQUE: Multidetector CT imaging of the chest, abdomen and pelvis was performed following the standard protocol during bolus administration of intravenous contrast. Multidetector CT imaging of the thoracic spine was performed following the standard protocol during bolus administration of intravenous contrast. RADIATION DOSE REDUCTION: This exam was performed according to the departmental dose-optimization program which includes automated exposure control, adjustment of the mA and/or kV according to patient size and/or use of iterative reconstruction technique. CONTRAST:  OMNIPAQUE IOHEXOL 300 MG/ML  SOLN COMPARISON:  CT abdomen pelvis, 06/08/2020 chest radiographs, 10/05/2014 FINDINGS: CT CHEST FINDINGS Cardiovascular: Aortic atherosclerosis. Normal cardiomegaly. Three-vessel coronary artery calcifications. No pericardial effusion. Mediastinum/Nodes: No enlarged mediastinal, hilar, or axillary lymph nodes. Small volume anterior pneumomediastinum (series 2, image 42). Thyroid gland, trachea, and esophagus demonstrate no significant findings. Lungs/Pleura: Moderate left, small right pleural effusions and associated atelectasis or consolidation. Mild centrilobular emphysema. Tiny, less than 5% left pneumothorax (series 4, image 34). Musculoskeletal: No chest wall mass or suspicious osseous lesions identified. Very subtle, nondisplaced fractures of the posterior left third and fourth ribs, much  better appreciated by comparison examination of the cervical spine (series 4, image 19). Subacute fractures of the anterior left seventh and eighth ribs, without acute displaced rib fracture (series 2, image 53, 59). CT ABDOMEN PELVIS FINDINGS Hepatobiliary: No solid liver abnormality is seen. No gallstones, gallbladder wall thickening, or biliary dilatation. Pancreas: Unremarkable. No pancreatic ductal dilatation or surrounding inflammatory changes. Spleen: Normal in size without significant abnormality. Adrenals/Urinary Tract: Adrenal glands are unremarkable. Kidneys are normal, without renal calculi, solid lesion, or hydronephrosis. Bladder is unremarkable. Stomach/Bowel: Stomach is within normal limits. Appendix is normal. No evidence of bowel wall thickening, distention, or inflammatory changes. Vascular/Lymphatic: Aortic atherosclerosis. No enlarged abdominal or pelvic lymph nodes. Reproductive: No mass or other abnormality. Other: Large right inguinal hernia containing the cecal base, incompletely imaged (series 2, image 120) no ascites. Musculoskeletal: No acute osseous findings. CT THORACIC SPINE FINDINGS Alignment: Exaggerated thoracic kyphosis. Vertebral bodies: Acute superior endplate deformity of T12 (series 5, image 62). Unchanged severe anterior wedge deformity of T8 (series 5, image 60). Age indeterminate anterior wedge deformity of T2 (series 5, image 60). Disc spaces: Intact. Paraspinous soft tissues: Paraspinous soft tissue stranding about T12 without evidence of discrete fluid collection or epidural hematoma (series 1, image 36). IMPRESSION: 1. Tiny, less than 5% left pneumothorax. 2. Small volume anterior pneumomediastinum. 3. Moderate left, small right pleural effusions and associated atelectasis or consolidation. 4. Acute superior endplate deformity of T12. Unchanged severe anterior wedge deformity of T8. Age indeterminate anterior wedge deformity of T2. 5. Paraspinous soft tissue stranding  about T12 without evidence of discrete fluid collection or epidural hematoma. 6. Very subtle, nondisplaced fractures of the posterior left third and fourth ribs 7. Subacute fractures of the anterior left seventh and eighth ribs, without acute displaced rib fracture 8. No CT evidence of  acute traumatic injury to the organs of the abdomen or pelvis. 9. Emphysema. 10. Coronary artery disease. These results were called by telephone at the time of interpretation on 10/17/2022 at 8:11 pm to Dr. Tresa Endo [facility] , who verbally acknowledged these results. Aortic Atherosclerosis (ICD10-I70.0) and Emphysema (ICD10-J43.9). Electronically Signed   By: Jearld Lesch M.D.   On: 10/17/2022 20:20   CT HEAD WO CONTRAST ( )  Result Date: 10/17/2022 CLINICAL DATA:  Provided history: Head trauma, minor. Neck trauma. EXAM: CT HEAD WITHOUT CONTRAST CT CERVICAL SPINE WITHOUT CONTRAST TECHNIQUE: Multidetector CT imaging of the head and cervical spine was performed following the standard protocol without intravenous contrast. Multiplanar CT image reconstructions of the cervical spine were also generated. RADIATION DOSE REDUCTION: This exam was performed according to the departmental dose-optimization program which includes automated exposure control, adjustment of the mA and/or kV according to patient size and/or use of iterative reconstruction technique. COMPARISON:  Prior CT head and CT cervical spine 05/28/2020. FINDINGS: CT HEAD FINDINGS Brain: Moderate to advanced generalized cerebral atrophy. Patchy and ill-defined hypoattenuation within the cerebral white matter, nonspecific but compatible with moderate chronic small vessel ischemic disease. 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 There is no acute intracranial hemorrhage. No demarcated cortical infarct. No extra-axial fluid collection. No evidence of an intracranial mass. No midline shift. Vascular: No hyperdense vessel. Atherosclerotic  calcifications. Skull: No fracture or aggressive osseous lesion. Sinuses/Orbits: No mass or acute finding within the imaged orbits. Moderate mucosal thickening within the right sphenoid sinus. Mild mucosal thickening, and possible small fluid level, within the left sphenoid sinus. Mild mucosal thickening scattered within bilateral ethmoid air cells. CT CERVICAL SPINE FINDINGS Alignment: 2 mm C7-T1 grade 1 anterolisthesis. Skull base and vertebrae: The basion-dental and atlanto-dental intervals are maintained.No evidence of acute fracture to the cervical spine. Age-indeterminate T2 anterior wedge vertebral compression fracture (40% height loss). Soft tissues and spinal canal: No prevertebral fluid or swelling. No visible canal hematoma. Disc levels: Cervical spondylosis with multilevel disc space narrowing, disc bulges/central disc protrusions, uncovertebral hypertrophy and facet arthrosis. Disc space narrowing is advanced at C6-C7 and there is a posterior disc osteophyte complex at this level. No appreciable high-grade spinal canal stenosis. Multilevel bony neural foraminal narrowing. Upper chest: Small left pneumothorax. Partially imaged left pleural fluid collection measuring up to 92 Hounsfield units in density, suspicious for hemothorax. Partially imaged right pleural effusion. Opacity within the posterior left upper lobe which may reflect atelectasis or pneumonia. Biapical pleuroparenchymal scarring. Other: Acute, nondisplaced fractures of the posterior left third, fourth and fifth ribs. CT cervical spine impressions #2, #3, #4 and #5 called by telephone at the time of interpretation on 10/17/2022 at 5:03 pm to provider [facility] , who verbally acknowledged these results. IMPRESSION: CT head: 1.  No evidence of an acute intracranial abnormality. 2. Moderate chronic small vessel ischemic changes within the cerebral white matter. 3. 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 4.  Moderate to advanced generalized cerebral atrophy. 5. Paranasal sinus disease, as described. CT cervical spine: 1. No evidence of acute fracture to the cervical spine. 2. Age-indeterminate T2 anterior wedge vertebral compression fracture. No bony retropulsion. 3. Small left pneumothorax. 4. Partly imaged left pleural fluid collection measuring up to 92 Hounsfield units in density, suspicious for hemothorax. A CT of the chest is recommended for further evaluation. 5. Acute, nondisplaced fractures of the posterior left third, fourth and fifth ribs. 6. Partially imaged right pleural effusion. 7. Opacity within the posterior left  upper lobe, which may reflect atelectasis or pneumonia. Electronically Signed   By: Jackey Loge D.O.   On: 10/17/2022 17:07   CT Cervical Spine Wo Contrast  Result Date: 10/17/2022 CLINICAL DATA:  Provided history: Head trauma, minor. Neck trauma. EXAM: CT HEAD WITHOUT CONTRAST CT CERVICAL SPINE WITHOUT CONTRAST TECHNIQUE: Multidetector CT imaging of the head and cervical spine was performed following the standard protocol without intravenous contrast. Multiplanar CT image reconstructions of the cervical spine were also generated. RADIATION DOSE REDUCTION: This exam was performed according to the departmental dose-optimization program which includes automated exposure control, adjustment of the mA and/or kV according to patient size and/or use of iterative reconstruction technique. COMPARISON:  Prior CT head and CT cervical spine 05/28/2020. FINDINGS: CT HEAD FINDINGS Brain: Moderate to advanced generalized cerebral atrophy. Patchy and ill-defined hypoattenuation within the cerebral white matter, nonspecific but compatible with moderate chronic small vessel ischemic disease. 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 There is no acute intracranial hemorrhage. No demarcated cortical infarct. No extra-axial fluid collection. No evidence of an intracranial  mass. No midline shift. Vascular: No hyperdense vessel. Atherosclerotic calcifications. Skull: No fracture or aggressive osseous lesion. Sinuses/Orbits: No mass or acute finding within the imaged orbits. Moderate mucosal thickening within the right sphenoid sinus. Mild mucosal thickening, and possible small fluid level, within the left sphenoid sinus. Mild mucosal thickening scattered within bilateral ethmoid air cells. CT CERVICAL SPINE FINDINGS Alignment: 2 mm C7-T1 grade 1 anterolisthesis. Skull base and vertebrae: The basion-dental and atlanto-dental intervals are maintained.No evidence of acute fracture to the cervical spine. Age-indeterminate T2 anterior wedge vertebral compression fracture (40% height loss). Soft tissues and spinal canal: No prevertebral fluid or swelling. No visible canal hematoma. Disc levels: Cervical spondylosis with multilevel disc space narrowing, disc bulges/central disc protrusions, uncovertebral hypertrophy and facet arthrosis. Disc space narrowing is advanced at C6-C7 and there is a posterior disc osteophyte complex at this level. No appreciable high-grade spinal canal stenosis. Multilevel bony neural foraminal narrowing. Upper chest: Small left pneumothorax. Partially imaged left pleural fluid collection measuring up to 92 Hounsfield units in density, suspicious for hemothorax. Partially imaged right pleural effusion. Opacity within the posterior left upper lobe which may reflect atelectasis or pneumonia. Biapical pleuroparenchymal scarring. Other: Acute, nondisplaced fractures of the posterior left third, fourth and fifth ribs. CT cervical spine impressions #2, #3, #4 and #5 called by telephone at the time of interpretation on 10/17/2022 at 5:03 pm to provider [facility] , who verbally acknowledged these results. IMPRESSION: CT head: 1.  No evidence of an acute intracranial abnormality. 2. Moderate chronic small vessel ischemic changes within the cerebral white matter. 3.  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 4. Moderate to advanced generalized cerebral atrophy. 5. Paranasal sinus disease, as described. CT cervical spine: 1. No evidence of acute fracture to the cervical spine. 2. Age-indeterminate T2 anterior wedge vertebral compression fracture. No bony retropulsion. 3. Small left pneumothorax. 4. Partly imaged left pleural fluid collection measuring up to 92 Hounsfield units in density, suspicious for hemothorax. A CT of the chest is recommended for further evaluation. 5. Acute, nondisplaced fractures of the posterior left third, fourth and fifth ribs. 6. Partially imaged right pleural effusion. 7. Opacity within the posterior left upper lobe, which may reflect atelectasis or pneumonia. Electronically Signed   By: Jackey Loge D.O.   On: 10/17/2022 17:07   DG Chest 2 View  Result Date: 10/17/2022 CLINICAL DATA:  Shortness of breath, fell a week  ago EXAM: CHEST - 2 VIEW COMPARISON:  02/04/2016, 08/01/2013 FINDINGS: Enlargement of cardiac silhouette. Mediastinal contours and pulmonary vascularity normal. Atherosclerotic calcification aorta. Bibasilar effusions and atelectasis. Upper lungs clear. No pneumothorax. Bones demineralized with progression of superior endplate compression deformity of a midthoracic vertebra and new vertebral plana of a lower thoracic vertebra since 2015. IMPRESSION: Bibasilar effusions and atelectasis. Enlargement of cardiac silhouette. Progressive compression fractures of mid and lower thoracic vertebra since 2015. Electronically Signed   By: Ulyses Southward M.D.   On: 10/17/2022 14:39   Physical Examination: Vitals:   10/17/22 2300 10/18/22 0030 10/18/22 0100 10/18/22 0130  BP: 109/62 110/88 108/71 107/66  Pulse: (!) 110 86 (!) 53 100  Temp:      Resp: (!) 25 18 (!) 27 (!) 28  SpO2: 93% 95% 93% 93%  TempSrc:       Physical Exam Vitals and nursing note reviewed.  Constitutional:      General: He is not in  acute distress.    Appearance: Normal appearance. He is not ill-appearing, toxic-appearing or diaphoretic.  HENT:     Head: Normocephalic and atraumatic.     Right Ear: Hearing and external ear normal.     Left Ear: Hearing and external ear normal.     Nose: Nose normal. No nasal deformity.     Mouth/Throat:     Lips: Pink.     Mouth: Mucous membranes are moist.     Tongue: No lesions.     Pharynx: Oropharynx is clear.  Eyes:     Extraocular Movements: Extraocular movements intact.  Neck:     Vascular: No carotid bruit.  Cardiovascular:     Rate and Rhythm: Tachycardia present. Rhythm irregular.     Pulses: Normal pulses.     Heart sounds: Normal heart sounds.  Pulmonary:     Effort: Pulmonary effort is normal.     Breath sounds: Normal breath sounds.  Abdominal:     General: Bowel sounds are normal. There is no distension.     Palpations: Abdomen is soft. There is no mass.     Tenderness: There is no abdominal tenderness. There is no guarding.     Hernia: No hernia is present.  Musculoskeletal:     Right lower leg: No edema.     Left lower leg: No edema.  Skin:    General: Skin is warm.  Neurological:     General: No focal deficit present.     Mental Status: He is alert and oriented to person, place, and time.     Cranial Nerves: Cranial nerves 2-12 are intact.     Motor: Motor function is intact.  Psychiatric:        Attention and Perception: Attention normal.        Mood and Affect: Mood normal.        Speech: Speech normal.        Behavior: Behavior normal. Behavior is cooperative.        Cognition and Memory: Cognition normal.    Assessment and Plan: * Paroxysmal atrial fibrillation with RVR (HCC) Patient noted to be in A-fib RVR on arrival. SpO2: 94 % No anticoagulation. Mag level pending.  Patient is currently on aspirin 81. Will correct electrolytes and manage will obtain thyroid function studies. Previous Echo: Echocardiogram 2D complete:  (02/13/2020) INTERPRETATION NORMAL LEFT VENTRICULAR SYSTOLIC FUNCTION NORMAL RIGHT VENTRICULAR SYSTOLIC FUNCTION MODERATE VALVULAR REGURGITATION (See above) NO VALVULAR STENOSIS MODERATE AR MILD TR, MR, PR EF 50-55% Closest  EF: >55% (Estimated) Mitral: MILD MR Tricuspid: MILD TR Aortic: MODERATE AR Sclerotic non stenotic aortic valve  Cardiology consult requested daughter wants complete treatment. After admission pt HR was in rvr so pt started on diltiazem drip . D/W daughter about risk and benefit of anticoagulation . It is not clear about why pt was not on [facility] in past for his DVT or Previous episode of a.fib has [id number] above 4 . She understands the risk of bleeding and stroke. We will start pt on heparin gtt per pharmacy protocol.      Closed T12 fracture (HCC) TLSO brace only for comfort per neurosurgery. Conservative management pain control physical therapy.  Falls, initial encounter Fall precautions. Physical therapy assessment. Once stable orthostatic BP.  Closed traumatic fracture of ribs of left side with pneumothorax Rib fracture on the left side with a small pneumothorax less than 5% with anterior pneumomediastinum. Conservative management Case was discussed with: Trauma. We will follow with reviewed chest x-ray and supplemental oxygen and pain control as needed.  Acute deep vein thrombosis (DVT) of left lower extremity (HCC) No anticoagulation. Suspect that this may be related to patient's history of fall and risk of bleeding.  Diabetes mellitus (HCC) Currently we will hold patient's metformin. Will continue management with Accu-Cheks and glycemic protocol.  HTN (hypertension) Vitals:   10/17/22 1407 10/17/22 1600 10/17/22 1630 10/17/22 1700  BP: 101/65 101/62 100/63 107/79   10/17/22 1730 10/17/22 1800 10/17/22 1830 10/17/22 2000  BP: 120/85 105/60 124/67 125/75   10/17/22 2030 10/17/22 2100 10/17/22 2130  BP: 114/78 105/76 112/68  Tailoring blood  pressure medication regimen to avoid abrupt lowering blood pressures with diuretic therapy.  Guide patient to a regimen with may be delaying scheduled blood pressure medication on days that he needs diuretic therapy for at least 12 hours.    DVT prophylaxis:  Heparin gtt. Code Status:  Full code.     10/17/2022    1:41 PM  Advanced Directives  Does Patient Have a Medical Advance Directive? No   Family Communication:  Herbert Phillips: daughter in law at bedside and discussed plan of care.  Emergency Contact: Contact Information     Name Relation Home Work [place] Daughter [phone]  [phone]   Richarda Osmond   [phone]       Disposition Plan:  TBD.  Consults: Cardiology consult: [place] . Admission status: Inpatient.    Unit / Expected LOS:  Med tele/ 2-3 days.   Gertha Calkin MD [facility]  6 PM- 2 AM. [phone]( Pager )  For questions regarding this patient please use [email or web address] to contact the current [facility] MD.   Bonita Quin may also call [phone] to contact current Assigned [facility] Attending/Consulting MD for this patient.

## 2022-10-17 NOTE — ED Notes (Signed)
Dr. Sidney Ace notified of pt's afib

## 2022-10-17 NOTE — ED Notes (Signed)
Dr. Allena Katz came and found this RN stating that the pt's BP was systolically 85.  Entered the room, pt is unchanged mentation.  Dr. Allena Katz wanted pt in trendelenburg, pt was placed in that position.  Pt's BP cuff was very loose on pt and cuff was tightened and pt came back to 128/77. Dr Allena Katz in the room for that as well.

## 2022-10-17 NOTE — ED Notes (Signed)
Pt bladder scanned. 196 mL

## 2022-10-17 NOTE — Assessment & Plan Note (Signed)
TLSO brace only for comfort per neurosurgery. Conservative management pain control physical therapy.

## 2022-10-17 NOTE — Assessment & Plan Note (Signed)
Vitals:   10/17/22 1407 10/17/22 1600 10/17/22 1630 10/17/22 1700  BP: 101/65 101/62 100/63 107/79   10/17/22 1730 10/17/22 1800 10/17/22 1830 10/17/22 2000  BP: 120/85 105/60 124/67 125/75   10/17/22 2030 10/17/22 2100 10/17/22 2130  BP: 114/78 105/76 112/68  Tailoring blood pressure medication regimen to avoid abrupt lowering blood pressures with diuretic therapy.  Guide patient to a regimen with may be delaying scheduled blood pressure medication on days that he needs diuretic therapy for at least 12 hours.

## 2022-10-17 NOTE — Assessment & Plan Note (Signed)
Holding metformin.  On sliding scale.  Continue Mevacor.

## 2022-10-17 NOTE — Assessment & Plan Note (Deleted)
No anticoagulation. Suspect that this may be related to patient's history of fall and risk of bleeding.

## 2022-10-17 NOTE — Hospital Course (Signed)
Admission request for: SOB since Monday  and found to be 2 rib #/ trace Pneumothorax. Found to be in a.fib RVR Fall on Monday, hit his head head ct is negative  Trauma at [facility] did not need to be seen there.  NS no rec for T12 #.

## 2022-10-17 NOTE — Assessment & Plan Note (Signed)
Rib fracture on the left side with a small pneumothorax less than 5% with anterior pneumomediastinum. Conservative management.  Patient not having any pain. Repeat chest x-ray still pending.

## 2022-10-17 NOTE — Assessment & Plan Note (Signed)
Try to taper off Cardizem drip.  Started oral metoprolol this morning.  Cardiology titrated up metoprolol to 3 times a day and added p.o. Cardizem.  Heparin drip switched over to Eliquis.  Benefits and risks of blood thinner explained to family.  Will talk with them again tomorrow on whether or not to do blood thinners at home.

## 2022-10-18 ENCOUNTER — Encounter: Payer: Self-pay | Admitting: Internal Medicine

## 2022-10-18 DIAGNOSIS — E785 Hyperlipidemia, unspecified: Secondary | ICD-10-CM

## 2022-10-18 DIAGNOSIS — Z8739 Personal history of other diseases of the musculoskeletal system and connective tissue: Secondary | ICD-10-CM

## 2022-10-18 DIAGNOSIS — I10 Essential (primary) hypertension: Secondary | ICD-10-CM

## 2022-10-18 DIAGNOSIS — S22080S Wedge compression fracture of T11-T12 vertebra, sequela: Secondary | ICD-10-CM

## 2022-10-18 DIAGNOSIS — S2242XA Multiple fractures of ribs, left side, initial encounter for closed fracture: Secondary | ICD-10-CM

## 2022-10-18 DIAGNOSIS — S270XXA Traumatic pneumothorax, initial encounter: Secondary | ICD-10-CM

## 2022-10-18 DIAGNOSIS — I480 Paroxysmal atrial fibrillation: Secondary | ICD-10-CM | POA: Diagnosis not present

## 2022-10-18 DIAGNOSIS — E1169 Type 2 diabetes mellitus with other specified complication: Secondary | ICD-10-CM | POA: Diagnosis not present

## 2022-10-18 LAB — PHOSPHORUS: Phosphorus: 4.1 mg/dL (ref 2.5–4.6)

## 2022-10-18 LAB — COMPREHENSIVE METABOLIC PANEL
ALT: 16 U/L (ref 0–44)
AST: 15 U/L (ref 15–41)
Albumin: 2.9 g/dL — ABNORMAL LOW (ref 3.5–5.0)
Alkaline Phosphatase: 71 U/L (ref 38–126)
Anion gap: 10 (ref 5–15)
BUN: 37 mg/dL — ABNORMAL HIGH (ref 8–23)
CO2: 24 mmol/L (ref 22–32)
Calcium: 8.3 mg/dL — ABNORMAL LOW (ref 8.9–10.3)
Chloride: 104 mmol/L (ref 98–111)
Creatinine, Ser: 1.09 mg/dL (ref 0.61–1.24)
GFR, Estimated: >60 mL/min (ref >60)
Glucose, Bld: 133 mg/dL — ABNORMAL HIGH (ref 70–99)
Potassium: 3.6 mmol/L (ref 3.5–5.1)
Sodium: 138 mmol/L (ref 135–145)
Total Bilirubin: 1 mg/dL (ref 0.3–1.2)
Total Protein: 6.6 g/dL (ref 6.5–8.1)

## 2022-10-18 LAB — CBC WITH DIFFERENTIAL/PLATELET
Abs Immature Granulocytes: 0.09 10*3/uL — ABNORMAL HIGH (ref 0.00–0.07)
Basophils Absolute: 0.1 10*3/uL (ref 0.0–0.1)
Basophils Relative: 1 %
Eosinophils Absolute: 0.1 10*3/uL (ref 0.0–0.5)
Eosinophils Relative: 1 %
HCT: 41.5 % (ref 39.0–52.0)
Hemoglobin: 13.7 g/dL (ref 13.0–17.0)
Immature Granulocytes: 1 %
Lymphocytes Relative: 14 %
Lymphs Abs: 1.4 10*3/uL (ref 0.7–4.0)
MCH: 31.4 pg (ref 26.0–34.0)
MCHC: 33 g/dL (ref 30.0–36.0)
MCV: 95 fL (ref 80.0–100.0)
Monocytes Absolute: 1.3 10*3/uL — ABNORMAL HIGH (ref 0.1–1.0)
Monocytes Relative: 13 %
Neutro Abs: 7.7 10*3/uL (ref 1.7–7.7)
Neutrophils Relative %: 70 %
Platelets: 204 10*3/uL (ref 150–400)
RBC: 4.37 MIL/uL (ref 4.22–5.81)
RDW: 14.6 % (ref 11.5–15.5)
WBC: 10.6 10*3/uL — ABNORMAL HIGH (ref 4.0–10.5)
nRBC: 0 % (ref 0.0–0.2)

## 2022-10-18 LAB — GLUCOSE, CAPILLARY
Glucose-Capillary: 102 mg/dL — ABNORMAL HIGH (ref 70–99)
Glucose-Capillary: 159 mg/dL — ABNORMAL HIGH (ref 70–99)
Glucose-Capillary: 157 mg/dL — ABNORMAL HIGH (ref 70–99)

## 2022-10-18 LAB — BLOOD GAS, VENOUS
Acid-base deficit: 11 mmol/L — ABNORMAL HIGH (ref 0.0–2.0)
Bicarbonate: 12.9 mmol/L — ABNORMAL LOW (ref 20.0–28.0)
O2 Saturation: 99.7 %
Patient temperature: 37
pCO2, Ven: 24 mmHg — ABNORMAL LOW (ref 44–60)
pH, Ven: 7.34 (ref 7.25–7.43)
pO2, Ven: 224 mmHg — ABNORMAL HIGH (ref 32–45)

## 2022-10-18 LAB — HEMOGLOBIN A1C
Hgb A1c MFr Bld: 6.1 % — ABNORMAL HIGH (ref 4.8–5.6)
Mean Plasma Glucose: 128.37 mg/dL

## 2022-10-18 LAB — MRSA NEXT GEN BY PCR, NASAL: MRSA by PCR Next Gen: NOT DETECTED

## 2022-10-18 LAB — HEPARIN LEVEL (UNFRACTIONATED): Heparin Unfractionated: <0.1 IU/mL — ABNORMAL LOW (ref 0.30–0.70)

## 2022-10-18 LAB — MAGNESIUM: Magnesium: 2.2 mg/dL (ref 1.7–2.4)

## 2022-10-18 LAB — T4, FREE: Free T4: 1.28 ng/dL — ABNORMAL HIGH (ref 0.61–1.12)

## 2022-10-18 LAB — TSH: TSH: 1.123 u[IU]/mL (ref 0.350–4.500)

## 2022-10-18 MED ORDER — ALLOPURINOL 100 MG PO TABS
100.0000 mg | ORAL_TABLET | Freq: Every day | ORAL | Status: DC
  Administered 2022-10-18 – 2022-10-20 (×3): 100 mg via ORAL
  Filled 2022-10-18 (×3): qty 1

## 2022-10-18 MED ORDER — METOPROLOL TARTRATE 25 MG PO TABS
25.0000 mg | ORAL_TABLET | Freq: Two times a day (BID) | ORAL | Status: DC
  Administered 2022-10-18: 25 mg via ORAL
  Filled 2022-10-18: qty 1

## 2022-10-18 MED ORDER — PRAVASTATIN SODIUM 20 MG PO TABS
20.0000 mg | ORAL_TABLET | Freq: Every day | ORAL | Status: DC
  Administered 2022-10-18 – 2022-10-19 (×2): 20 mg via ORAL
  Filled 2022-10-18 (×2): qty 1

## 2022-10-18 MED ORDER — APIXABAN 5 MG PO TABS
5.0000 mg | ORAL_TABLET | Freq: Two times a day (BID) | ORAL | Status: DC
  Administered 2022-10-18 – 2022-10-19 (×3): 5 mg via ORAL
  Filled 2022-10-18 (×3): qty 1

## 2022-10-18 MED ORDER — CHLORHEXIDINE GLUCONATE CLOTH 2 % EX PADS
6.0000 | MEDICATED_PAD | Freq: Every day | CUTANEOUS | Status: DC
  Administered 2022-10-18 – 2022-10-19 (×2): 6 via TOPICAL

## 2022-10-18 MED ORDER — DILTIAZEM HCL 30 MG PO TABS
30.0000 mg | ORAL_TABLET | Freq: Four times a day (QID) | ORAL | Status: DC
  Administered 2022-10-18 – 2022-10-20 (×8): 30 mg via ORAL
  Filled 2022-10-18 (×8): qty 1

## 2022-10-18 MED ORDER — METOPROLOL TARTRATE 25 MG PO TABS
25.0000 mg | ORAL_TABLET | Freq: Three times a day (TID) | ORAL | Status: DC
  Administered 2022-10-18 – 2022-10-19 (×5): 25 mg via ORAL
  Filled 2022-10-18 (×5): qty 1

## 2022-10-18 MED ORDER — HEPARIN (PORCINE) 25000 UT/250ML-% IV SOLN
950.0000 [IU]/h | INTRAVENOUS | Status: DC
  Administered 2022-10-18: 950 [IU]/h via INTRAVENOUS
  Filled 2022-10-18: qty 250

## 2022-10-18 NOTE — Evaluation (Signed)
Physical Therapy Evaluation Patient Details Name: Herbert Phillips MRN: [id number] DOB: 08-21-1925 Today's Date: 10/18/2022  History of Present Illness  Herbert Phillips is an 89 y.o. male seen in the emergency room brought by EMS from [facility] complaints of shortness of breath.  Patient had a fall on Monday and during fall patient hit his head but did not have any loss of consciousness.  Since his fall he is becoming progressively weak short of breath. Admitted for further evaluation after being found to have rib fractures and small pneumothorax.  Patient denies palpitation tachycardia Baclospas or syncope; Patient was placed on Cardizem drip and heparin drip. 4/27 was switched from heparin to Eliquis; Continuing to monitor BP as managing HR; Ordered PT to assess for balance/recent fall.  Clinical Impression  89 yo Male admitted to hospital with tachycardia and recent fall. Patient lives at [facility] [place]. He reports living in an apartment (unsure if independent or assisted living). He reports being independent in all self care ADLs such as bathing and dressing and then will go to another room for meals (3x a day); staff will also prepare medications. When resting in bed, HR elevated in 110's, Spo2 mid 90's on room air, pt denies any chest pain or shortness of breath. He states, "I feel ok. I know I am here to see the doctors but I'm ready to go home. This is the worst possible room." He admits having a recent fall within last few weeks. Unable to recall what led to fall. Patient is able to sit edge of bed modified independent, requiring bed rails and elevated head of bed. He does exhibit weakness in BUE/BLE. He reports doing exercise at [facility] [place]. Patient required RW, min A and elevated surface to transfer sit to stand. He was able to stand for short periods of time with RW with stand by assist. Patient ambulated out of room x20 feet with elevated HR to 145, Spo2  dropped to 80%; He denies any symptoms, no shortness of breath or chest pain. He states, "I feel fine. I walk farther than this all the time at home." He also reports, "This is not a fair test, I wish you would do this at home. I need to do this in my environment. Its not a fair test.". Patient was instructed to return back to bed. Upon sitting down, Spo2 increased back to 90% quickly (<30 sec) and HR decreased to 120's. RN aware of vital response and came in to give patient medication. Patient continues to deny any symptoms. Patient would benefit from skilled PT intervention to improve strength and mobility; Recommend frequent supervision for safety and mobility upon discharge.        Recommendations for follow up therapy are one component of a multi-disciplinary discharge planning process, led by the attending physician.  Recommendations may be updated based on patient status, additional functional criteria and insurance authorization.  Follow Up Recommendations       Assistance Recommended at Discharge Frequent or constant Supervision/Assistance  Patient can return home with the following  A little help with walking and/or transfers;A little help with bathing/dressing/bathroom;Assistance with cooking/housework;Direct supervision/assist for medications management;Direct supervision/assist for financial management;Assist for transportation;Help with stairs or ramp for entrance    Equipment Recommendations None recommended by PT  Recommendations for Other Services       Functional Status Assessment Patient has had a recent decline in their functional status and demonstrates the ability to make significant improvements in function  in a reasonable and predictable amount of time.     Precautions / Restrictions Precautions Precautions: Fall Restrictions Weight Bearing Restrictions: No      Mobility  Bed Mobility Overal bed mobility: Modified Independent             General bed  mobility comments: elevated head of bed, uses bed rails; requires extra time; Patient Response: Impulsive  Transfers Overall transfer level: Needs assistance Equipment used: Rolling walker (2 wheels) Transfers: Sit to/from Stand Sit to Stand: Min assist           General transfer comment: with elevated surface;    Ambulation/Gait Ambulation/Gait assistance: Min assist Gait Distance (Feet): 50 Feet Assistive device: Rolling walker (2 wheels) Gait Pattern/deviations: Decreased step length - right, Decreased step length - left, Trunk flexed, Shuffle Gait velocity: decreased     General Gait Details: pt ambulates with heavy flexed posture, shuffled steps with decreased step length bilaterally;  Stairs            Wheelchair Mobility    Modified Rankin (Stroke Patients Only)       Balance Overall balance assessment: Needs assistance Sitting-balance support: Feet supported, No upper extremity supported Sitting balance-Leahy Scale: Good     Standing balance support: Bilateral upper extremity supported Standing balance-Leahy Scale: Fair Standing balance comment: requires RW and stand by assist when standing;                             Pertinent Vitals/Pain Pain Assessment Pain Assessment: No/denies pain    Home Living Family/patient expects to be discharged to:: Private residence                 Home Equipment: Agricultural consultant (2 wheels);Rollator (4 wheels) Additional Comments: Pt resides at [facility], unsure if independent or assisted living; daughter in law helps with transport and coordinates his care    Prior Function               Mobility Comments: uses RW inside apartment; has a rollator for community mobility; ADLs Comments: Pt does bathing/dressing; facility provides meals; facility prepares medication;     Hand Dominance   Dominant Hand: Right    Extremity/Trunk Assessment   Upper Extremity Assessment Upper  Extremity Assessment: Overall WFL for tasks assessed    Lower Extremity Assessment Lower Extremity Assessment: Generalized weakness    Cervical / Trunk Assessment Cervical / Trunk Assessment: Kyphotic  Communication   Communication: No difficulties  Cognition Arousal/Alertness: Awake/alert Behavior During Therapy: WFL for tasks assessed/performed Overall Cognitive Status: Within Functional Limits for tasks assessed                                 General Comments: oriented to place, person, date; somewhat confused on situation, aware of fall but sequence of events confused        General Comments General comments (skin integrity, edema, etc.): pt has significant flexed kyphotic curve in thoracic spine; when standing is heavily flexed with forward head;    Exercises     Assessment/Plan    PT Assessment Patient needs continued PT services  PT Problem List Decreased strength;Decreased balance;Decreased mobility;Cardiopulmonary status limiting activity;Decreased activity tolerance;Decreased safety awareness       PT Treatment Interventions Functional mobility training;Balance training;Patient/family education;Gait training;Therapeutic activities;Therapeutic exercise    PT Goals (Current goals can be found in the  Care Plan section)  Acute Rehab PT Goals Patient Stated Goal: to go home PT Goal Formulation: With patient Time For Goal Achievement: 11/01/22 Potential to Achieve Goals: Good    Frequency Min 2X/week     Co-evaluation               AM-PAC PT "6 Clicks" Mobility  Outcome Measure Help needed turning from your back to your side while in a flat bed without using bedrails?: A Little Help needed moving from lying on your back to sitting on the side of a flat bed without using bedrails?: A Little Help needed moving to and from a bed to a chair (including a wheelchair)?: A Little Help needed standing up from a chair using your arms (e.g.,  wheelchair or bedside chair)?: A Lot Help needed to walk in hospital room?: A Little Help needed climbing 3-5 steps with a railing? : A Lot 6 Click Score: 16    End of Session Equipment Utilized During Treatment: Gait belt Activity Tolerance: Treatment limited secondary to medical complications (Comment);Patient tolerated treatment well (elevated HR limited gait tolerance) Patient left: in bed;with bed alarm set;with call bell/phone within reach Nurse Communication: Mobility status PT Visit Diagnosis: Unsteadiness on feet (R26.81);Other abnormalities of gait and mobility (R26.89);History of falling (Z91.81);Muscle weakness (generalized) (M62.81)    Time: 1550-1620 PT Time Calculation (min) (ACUTE ONLY): 30 min   Charges:   PT Evaluation $PT Eval Low Complexity: 1 Low          Raaga Maeder PT, DPT 10/18/2022, 4:39 PM

## 2022-10-18 NOTE — Consult Note (Signed)
CARDIOLOGY CONSULT NOTE               Patient ID: Eligha Kmetz MRN: [id number] DOB/AGE: 1925-10-27 89 y.o.  Admit date: 10/17/2022 Referring Physician Dr. Irena Cords hospitalist Primary Physician Dr. Einar Crow primary Primary Cardiologist Dr. Lorenso Quarry Reason for Consultation atrial fibrillation  HPI: Patient is a 89 year old male brought in by EMS after fall he is at the [facility] he has some shortness of breath he fell last Monday did not seek medical attention and he hit his head but there is no obvious injuries no loss of consciousness, head chest area but denied any significant pain no seizures no outward bleeding because of this dyspnea and hypoxic sats in the 90s she was brought to the emergency room and found to have heart rate about 140 he was placed on 3 L which improved his oxygenation patient was found to be in rapid atrial fibrillation not on anticoagulation with known previous A-fib he has had trouble with hematuria which made anticoagulation problematic patient is now here for further evaluation after being found to have rib fractures and small pneumothorax.  Patient denies palpitation tachycardia Baclospas or syncope  Review of systems complete and found to be negative unless listed above     Past Medical History:  Diagnosis Date   Arthritis    B12 deficiency    Bladder cancer (HCC)    2 yrs ago   Diabetes (HCC) 09/14/2013   Dysrhythmia    Bradycardia   Hip fracture, left (HCC) 02/04/2016   Hypertension    Non-insulin dependent type 2 diabetes mellitus (HCC)    Sleep apnea     Past Surgical History:  Procedure Laterality Date   cyst on spine  1961   EAR CYST EXCISION     INTRAMEDULLARY (IM) NAIL INTERTROCHANTERIC Left 02/04/2016   Procedure: INTRAMEDULLARY (IM) NAIL INTERTROCHANTRIC;  Surgeon: Christena Flake, MD;  Location: [facility] ORS;  Service: Orthopedics;  Laterality: Left;   TRANSURETHRAL RESECTION OF BLADDER TUMOR N/A  10/24/2014   Procedure: TRANSURETHRAL RESECTION OF BLADDER TUMOR (TURBT);  Surgeon: Orson Ape, MD;  Location: [facility] ORS;  Service: Urology;  Laterality: N/A;    Medications Prior to Admission  Medication Sig Dispense Refill Last Dose   acetaminophen (TYLENOL) 325 MG tablet Take 650 mg by mouth every 4 (four) hours as needed for mild pain, moderate pain or fever.   10/17/2022 at 0651   allopurinol (ZYLOPRIM) 100 MG tablet Take 100 mg by mouth daily.  3 10/17/2022 at 1140   aspirin EC 81 MG tablet Take 81 mg by mouth daily.    10/17/2022 at 1140   Cholecalciferol (VITAMIN D PO) Take 1,000 Units by mouth daily.   10/17/2022   Cod Liver Oil 1000 MG CAPS Take 1 capsule by mouth daily.   10/17/2022 at 1140   Dextran 70-Hypromellose, PF, (ARTIFICIAL TEARS PF) 0.1-0.3 % SOLN Place 1 drop into both eyes 2 (two) times daily.   10/17/2022 at 1140   diphenhydrAMINE (BENADRYL) 25 MG tablet Take 50 mg by mouth at bedtime as needed for sleep.   10/16/2022 at 1831   docusate sodium (COLACE) 100 MG capsule Take 1 capsule (100 mg total) by mouth 2 (two) times daily. 10 capsule 0 10/17/2022 at 1140   furosemide (LASIX) 20 MG tablet Take 20 mg by mouth as needed.    prn at prn   Glucosamine Sulfate (GLUCOSAMINE RELIEF) 1000 MG TABS Take 1,000 mg by mouth daily.  10/17/2022 at 1140   ipratropium (ATROVENT) 0.03 % nasal spray Place 2 sprays into both nostrils 3 (three) times daily. Hold for overly dry nose.   10/17/2022 at 0651   lovastatin (MEVACOR) 20 MG tablet Take 20 mg by mouth daily at 6 PM.    10/16/2022 at 1830   metFORMIN (GLUCOPHAGE) 500 MG tablet Take 500 mg by mouth daily. Take with a meal.   10/17/2022 at 1140   Multiple Vitamins-Minerals (PRESERVISION AREDS 2) CAPS Take by mouth. Take one capsule by mouth twice daily for eye health.   10/17/2022 at 1140   Omega 3 1000 MG CAPS Take 3,000 mg by mouth 2 (two) times daily.   10/17/2022 at 1140   Polyvinyl Alcohol-Povidone PF (REFRESH) 1.4-0.6 % SOLN Place 1 drop into  both eyes 2 (two) times daily as needed (dry eyes).   prn at prn   Probiotic Product (PROBIOTIC-10 ULTIMATE) CAPS Take 1 capsule by mouth daily.   10/17/2022 at 1140   Psyllium 400 MG CAPS Take 1 capsule by mouth daily.   10/17/2022 at 1140   ramipril (ALTACE) 2.5 MG capsule Take 2.5 mg by mouth daily.   10/17/2022 at 1140   tamsulosin (FLOMAX) 0.4 MG CAPS capsule Take 0.8 mg by mouth daily.   10/17/2022 at 1140   torsemide (DEMADEX) 10 MG tablet Take 10 mg by mouth daily as needed.   prn at prn   vitamin B-12 (CYANOCOBALAMIN) 1000 MCG tablet Take 1,000 mcg by mouth daily.   10/17/2022 at 1140   White Petrolatum-Mineral Oil (SYSTANE NIGHTTIME) OINT Apply to both eyes at bedtime  as needed for eye irritation.   prn at prn   FREESTYLE LITE test strip USE 1 STRIP TO CHECK GLUCOSE TWICE DAILY  5    Lancets (FREESTYLE) lancets USE TO TEST BLOOD SUGAR ONCE DAILY      Social History   Socioeconomic History   Marital status: Widowed    Spouse name: Not on file   Number of children: Not on file   Years of education: Not on file   Highest education level: Not on file  Occupational History   Not on file  Tobacco Use   Smoking status: Former    Types: Cigarettes    Quit date: 06/13/1976    Years since quitting: 46.3   Smokeless tobacco: Never  Substance and Sexual Activity   Alcohol use: Yes    Comment: monthly 3 beers since xmas   Drug use: No   Sexual activity: Not on file  Other Topics Concern   Not on file  Social History Narrative   Not on file   Social Determinants of Health   Financial Resource Strain: Not on file  Food Insecurity: Not on file  Transportation Needs: Not on file  Physical Activity: Not on file  Stress: Not on file  Social Connections: Not on file  Intimate Partner Violence: Not on file    Family History  Problem Relation Age of Onset   Diabetes Neg Hx       Review of systems complete and found to be negative unless listed above      PHYSICAL  EXAM  General: Well developed, well nourished, in no acute distress HEENT:  Normocephalic and atramatic Neck:  No JVD.  Lungs: Clear bilaterally to auscultation and percussion. Heart: Irregularly irregular. Normal S1 and S2 without gallops or murmurs.  Abdomen: Bowel sounds are positive, abdomen soft and non-tender  Msk:  Back normal, normal gait. Normal  strength and tone for age. Extremities: No clubbing, cyanosis or edema.  Multiple ecchymotic bruising of hands and skin  Neuro: Alert and oriented X 3. Psych:  Good affect, responds appropriately  Labs:   Lab Results  Component Value Date   WBC 10.6 (H) 10/18/2022   HGB 13.7 10/18/2022   HCT 41.5 10/18/2022   MCV 95.0 10/18/2022   PLT 204 10/18/2022    Recent Labs  Lab 10/18/22 0620  NA 138  K 3.6  CL 104  CO2 24  BUN 37*  CREATININE 1.09  CALCIUM 8.3*  PROT 6.6  BILITOT 1.0  ALKPHOS 71  ALT 16  AST 15  GLUCOSE 133*   Lab Results  Component Value Date   TROPONINI <0.03 10/05/2014   No results found for: "CHOL" No results found for: "HDL" No results found for: "LDLCALC" No results found for: "TRIG" No results found for: "CHOLHDL" No results found for: "LDLDIRECT"    Radiology: CT CHEST ABDOMEN PELVIS W CONTRAST  Result Date: 10/17/2022 CLINICAL DATA:  Fall 1 week ago, shortness of breath, bruising to back EXAM: CT CHEST, ABDOMEN, AND PELVIS WITH CONTRAST CT THORACIC SPINE WITH CONTRAST TECHNIQUE: Multidetector CT imaging of the chest, abdomen and pelvis was performed following the standard protocol during bolus administration of intravenous contrast. Multidetector CT imaging of the thoracic spine was performed following the standard protocol during bolus administration of intravenous contrast. RADIATION DOSE REDUCTION: This exam was performed according to the departmental dose-optimization program which includes automated exposure control, adjustment of the mA and/or kV according to patient size and/or use of  iterative reconstruction technique. CONTRAST:  OMNIPAQUE IOHEXOL 300 MG/ML  SOLN COMPARISON:  CT abdomen pelvis, 06/08/2020 chest radiographs, 10/05/2014 FINDINGS: CT CHEST FINDINGS Cardiovascular: Aortic atherosclerosis. Normal cardiomegaly. Three-vessel coronary artery calcifications. No pericardial effusion. Mediastinum/Nodes: No enlarged mediastinal, hilar, or axillary lymph nodes. Small volume anterior pneumomediastinum (series 2, image 42). Thyroid gland, trachea, and esophagus demonstrate no significant findings. Lungs/Pleura: Moderate left, small right pleural effusions and associated atelectasis or consolidation. Mild centrilobular emphysema. Tiny, less than 5% left pneumothorax (series 4, image 34). Musculoskeletal: No chest wall mass or suspicious osseous lesions identified. Very subtle, nondisplaced fractures of the posterior left third and fourth ribs, much better appreciated by comparison examination of the cervical spine (series 4, image 19). Subacute fractures of the anterior left seventh and eighth ribs, without acute displaced rib fracture (series 2, image 53, 59). CT ABDOMEN PELVIS FINDINGS Hepatobiliary: No solid liver abnormality is seen. No gallstones, gallbladder wall thickening, or biliary dilatation. Pancreas: Unremarkable. No pancreatic ductal dilatation or surrounding inflammatory changes. Spleen: Normal in size without significant abnormality. Adrenals/Urinary Tract: Adrenal glands are unremarkable. Kidneys are normal, without renal calculi, solid lesion, or hydronephrosis. Bladder is unremarkable. Stomach/Bowel: Stomach is within normal limits. Appendix is normal. No evidence of bowel wall thickening, distention, or inflammatory changes. Vascular/Lymphatic: Aortic atherosclerosis. No enlarged abdominal or pelvic lymph nodes. Reproductive: No mass or other abnormality. Other: Large right inguinal hernia containing the cecal base, incompletely imaged (series 2, image 120) no ascites.  Musculoskeletal: No acute osseous findings. CT THORACIC SPINE FINDINGS Alignment: Exaggerated thoracic kyphosis. Vertebral bodies: Acute superior endplate deformity of T12 (series 5, image 62). Unchanged severe anterior wedge deformity of T8 (series 5, image 60). Age indeterminate anterior wedge deformity of T2 (series 5, image 60). Disc spaces: Intact. Paraspinous soft tissues: Paraspinous soft tissue stranding about T12 without evidence of discrete fluid collection or epidural hematoma (series 1, image 36). IMPRESSION: 1.  Tiny, less than 5% left pneumothorax. 2. Small volume anterior pneumomediastinum. 3. Moderate left, small right pleural effusions and associated atelectasis or consolidation. 4. Acute superior endplate deformity of T12. Unchanged severe anterior wedge deformity of T8. Age indeterminate anterior wedge deformity of T2. 5. Paraspinous soft tissue stranding about T12 without evidence of discrete fluid collection or epidural hematoma. 6. Very subtle, nondisplaced fractures of the posterior left third and fourth ribs 7. Subacute fractures of the anterior left seventh and eighth ribs, without acute displaced rib fracture 8. No CT evidence of acute traumatic injury to the organs of the abdomen or pelvis. 9. Emphysema. 10. Coronary artery disease. These results were called by telephone at the time of interpretation on 10/17/2022 at 8:11 pm to Dr. Tresa Endo [facility] , who verbally acknowledged these results. Aortic Atherosclerosis (ICD10-I70.0) and Emphysema (ICD10-J43.9). Electronically Signed   By: Jearld Lesch M.D.   On: 10/17/2022 20:20   CT T-SPINE NO CHARGE  Result Date: 10/17/2022 CLINICAL DATA:  Fall 1 week ago, shortness of breath, bruising to back EXAM: CT CHEST, ABDOMEN, AND PELVIS WITH CONTRAST CT THORACIC SPINE WITH CONTRAST TECHNIQUE: Multidetector CT imaging of the chest, abdomen and pelvis was performed following the standard protocol during bolus administration of intravenous contrast.  Multidetector CT imaging of the thoracic spine was performed following the standard protocol during bolus administration of intravenous contrast. RADIATION DOSE REDUCTION: This exam was performed according to the departmental dose-optimization program which includes automated exposure control, adjustment of the mA and/or kV according to patient size and/or use of iterative reconstruction technique. CONTRAST:  OMNIPAQUE IOHEXOL 300 MG/ML  SOLN COMPARISON:  CT abdomen pelvis, 06/08/2020 chest radiographs, 10/05/2014 FINDINGS: CT CHEST FINDINGS Cardiovascular: Aortic atherosclerosis. Normal cardiomegaly. Three-vessel coronary artery calcifications. No pericardial effusion. Mediastinum/Nodes: No enlarged mediastinal, hilar, or axillary lymph nodes. Small volume anterior pneumomediastinum (series 2, image 42). Thyroid gland, trachea, and esophagus demonstrate no significant findings. Lungs/Pleura: Moderate left, small right pleural effusions and associated atelectasis or consolidation. Mild centrilobular emphysema. Tiny, less than 5% left pneumothorax (series 4, image 34). Musculoskeletal: No chest wall mass or suspicious osseous lesions identified. Very subtle, nondisplaced fractures of the posterior left third and fourth ribs, much better appreciated by comparison examination of the cervical spine (series 4, image 19). Subacute fractures of the anterior left seventh and eighth ribs, without acute displaced rib fracture (series 2, image 53, 59). CT ABDOMEN PELVIS FINDINGS Hepatobiliary: No solid liver abnormality is seen. No gallstones, gallbladder wall thickening, or biliary dilatation. Pancreas: Unremarkable. No pancreatic ductal dilatation or surrounding inflammatory changes. Spleen: Normal in size without significant abnormality. Adrenals/Urinary Tract: Adrenal glands are unremarkable. Kidneys are normal, without renal calculi, solid lesion, or hydronephrosis. Bladder is unremarkable. Stomach/Bowel: Stomach is  within normal limits. Appendix is normal. No evidence of bowel wall thickening, distention, or inflammatory changes. Vascular/Lymphatic: Aortic atherosclerosis. No enlarged abdominal or pelvic lymph nodes. Reproductive: No mass or other abnormality. Other: Large right inguinal hernia containing the cecal base, incompletely imaged (series 2, image 120) no ascites. Musculoskeletal: No acute osseous findings. CT THORACIC SPINE FINDINGS Alignment: Exaggerated thoracic kyphosis. Vertebral bodies: Acute superior endplate deformity of T12 (series 5, image 62). Unchanged severe anterior wedge deformity of T8 (series 5, image 60). Age indeterminate anterior wedge deformity of T2 (series 5, image 60). Disc spaces: Intact. Paraspinous soft tissues: Paraspinous soft tissue stranding about T12 without evidence of discrete fluid collection or epidural hematoma (series 1, image 36). IMPRESSION: 1. Tiny, less than 5% left pneumothorax. 2. Small  volume anterior pneumomediastinum. 3. Moderate left, small right pleural effusions and associated atelectasis or consolidation. 4. Acute superior endplate deformity of T12. Unchanged severe anterior wedge deformity of T8. Age indeterminate anterior wedge deformity of T2. 5. Paraspinous soft tissue stranding about T12 without evidence of discrete fluid collection or epidural hematoma. 6. Very subtle, nondisplaced fractures of the posterior left third and fourth ribs 7. Subacute fractures of the anterior left seventh and eighth ribs, without acute displaced rib fracture 8. No CT evidence of acute traumatic injury to the organs of the abdomen or pelvis. 9. Emphysema. 10. Coronary artery disease. These results were called by telephone at the time of interpretation on 10/17/2022 at 8:11 pm to Dr. Tresa Endo [facility] , who verbally acknowledged these results. Aortic Atherosclerosis (ICD10-I70.0) and Emphysema (ICD10-J43.9). Electronically Signed   By: Jearld Lesch M.D.   On: 10/17/2022 20:20   CT HEAD  WO CONTRAST ( )  Result Date: 10/17/2022 CLINICAL DATA:  Provided history: Head trauma, minor. Neck trauma. EXAM: CT HEAD WITHOUT CONTRAST CT CERVICAL SPINE WITHOUT CONTRAST TECHNIQUE: Multidetector CT imaging of the head and cervical spine was performed following the standard protocol without intravenous contrast. Multiplanar CT image reconstructions of the cervical spine were also generated. RADIATION DOSE REDUCTION: This exam was performed according to the departmental dose-optimization program which includes automated exposure control, adjustment of the mA and/or kV according to patient size and/or use of iterative reconstruction technique. COMPARISON:  Prior CT head and CT cervical spine 05/28/2020. FINDINGS: CT HEAD FINDINGS Brain: Moderate to advanced generalized cerebral atrophy. Patchy and ill-defined hypoattenuation within the cerebral white matter, nonspecific but compatible with moderate chronic small vessel ischemic disease. 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 There is no acute intracranial hemorrhage. No demarcated cortical infarct. No extra-axial fluid collection. No evidence of an intracranial mass. No midline shift. Vascular: No hyperdense vessel. Atherosclerotic calcifications. Skull: No fracture or aggressive osseous lesion. Sinuses/Orbits: No mass or acute finding within the imaged orbits. Moderate mucosal thickening within the right sphenoid sinus. Mild mucosal thickening, and possible small fluid level, within the left sphenoid sinus. Mild mucosal thickening scattered within bilateral ethmoid air cells. CT CERVICAL SPINE FINDINGS Alignment: 2 mm C7-T1 grade 1 anterolisthesis. Skull base and vertebrae: The basion-dental and atlanto-dental intervals are maintained.No evidence of acute fracture to the cervical spine. Age-indeterminate T2 anterior wedge vertebral compression fracture (40% height loss). Soft tissues and spinal canal: No prevertebral fluid  or swelling. No visible canal hematoma. Disc levels: Cervical spondylosis with multilevel disc space narrowing, disc bulges/central disc protrusions, uncovertebral hypertrophy and facet arthrosis. Disc space narrowing is advanced at C6-C7 and there is a posterior disc osteophyte complex at this level. No appreciable high-grade spinal canal stenosis. Multilevel bony neural foraminal narrowing. Upper chest: Small left pneumothorax. Partially imaged left pleural fluid collection measuring up to 92 Hounsfield units in density, suspicious for hemothorax. Partially imaged right pleural effusion. Opacity within the posterior left upper lobe which may reflect atelectasis or pneumonia. Biapical pleuroparenchymal scarring. Other: Acute, nondisplaced fractures of the posterior left third, fourth and fifth ribs. CT cervical spine impressions #2, #3, #4 and #5 called by telephone at the time of interpretation on 10/17/2022 at 5:03 pm to provider [facility] , who verbally acknowledged these results. IMPRESSION: CT head: 1.  No evidence of an acute intracranial abnormality. 2. Moderate chronic small vessel ischemic changes within the cerebral white matter. 3. 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 4. Moderate to advanced generalized cerebral atrophy. 5.  Paranasal sinus disease, as described. CT cervical spine: 1. No evidence of acute fracture to the cervical spine. 2. Age-indeterminate T2 anterior wedge vertebral compression fracture. No bony retropulsion. 3. Small left pneumothorax. 4. Partly imaged left pleural fluid collection measuring up to 92 Hounsfield units in density, suspicious for hemothorax. A CT of the chest is recommended for further evaluation. 5. Acute, nondisplaced fractures of the posterior left third, fourth and fifth ribs. 6. Partially imaged right pleural effusion. 7. Opacity within the posterior left upper lobe, which may reflect atelectasis or pneumonia. Electronically  Signed   By: Jackey Loge D.O.   On: 10/17/2022 17:07   CT Cervical Spine Wo Contrast  Result Date: 10/17/2022 CLINICAL DATA:  Provided history: Head trauma, minor. Neck trauma. EXAM: CT HEAD WITHOUT CONTRAST CT CERVICAL SPINE WITHOUT CONTRAST TECHNIQUE: Multidetector CT imaging of the head and cervical spine was performed following the standard protocol without intravenous contrast. Multiplanar CT image reconstructions of the cervical spine were also generated. RADIATION DOSE REDUCTION: This exam was performed according to the departmental dose-optimization program which includes automated exposure control, adjustment of the mA and/or kV according to patient size and/or use of iterative reconstruction technique. COMPARISON:  Prior CT head and CT cervical spine 05/28/2020. FINDINGS: CT HEAD FINDINGS Brain: Moderate to advanced generalized cerebral atrophy. Patchy and ill-defined hypoattenuation within the cerebral white matter, nonspecific but compatible with moderate chronic small vessel ischemic disease. 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 There is no acute intracranial hemorrhage. No demarcated cortical infarct. No extra-axial fluid collection. No evidence of an intracranial mass. No midline shift. Vascular: No hyperdense vessel. Atherosclerotic calcifications. Skull: No fracture or aggressive osseous lesion. Sinuses/Orbits: No mass or acute finding within the imaged orbits. Moderate mucosal thickening within the right sphenoid sinus. Mild mucosal thickening, and possible small fluid level, within the left sphenoid sinus. Mild mucosal thickening scattered within bilateral ethmoid air cells. CT CERVICAL SPINE FINDINGS Alignment: 2 mm C7-T1 grade 1 anterolisthesis. Skull base and vertebrae: The basion-dental and atlanto-dental intervals are maintained.No evidence of acute fracture to the cervical spine. Age-indeterminate T2 anterior wedge vertebral compression fracture  (40% height loss). Soft tissues and spinal canal: No prevertebral fluid or swelling. No visible canal hematoma. Disc levels: Cervical spondylosis with multilevel disc space narrowing, disc bulges/central disc protrusions, uncovertebral hypertrophy and facet arthrosis. Disc space narrowing is advanced at C6-C7 and there is a posterior disc osteophyte complex at this level. No appreciable high-grade spinal canal stenosis. Multilevel bony neural foraminal narrowing. Upper chest: Small left pneumothorax. Partially imaged left pleural fluid collection measuring up to 92 Hounsfield units in density, suspicious for hemothorax. Partially imaged right pleural effusion. Opacity within the posterior left upper lobe which may reflect atelectasis or pneumonia. Biapical pleuroparenchymal scarring. Other: Acute, nondisplaced fractures of the posterior left third, fourth and fifth ribs. CT cervical spine impressions #2, #3, #4 and #5 called by telephone at the time of interpretation on 10/17/2022 at 5:03 pm to provider [facility] , who verbally acknowledged these results. IMPRESSION: CT head: 1.  No evidence of an acute intracranial abnormality. 2. Moderate chronic small vessel ischemic changes within the cerebral white matter. 3. 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 4. Moderate to advanced generalized cerebral atrophy. 5. Paranasal sinus disease, as described. CT cervical spine: 1. No evidence of acute fracture to the cervical spine. 2. Age-indeterminate T2 anterior wedge vertebral compression fracture. No bony retropulsion. 3. Small left pneumothorax. 4. Partly imaged left pleural fluid collection measuring  up to 92 Hounsfield units in density, suspicious for hemothorax. A CT of the chest is recommended for further evaluation. 5. Acute, nondisplaced fractures of the posterior left third, fourth and fifth ribs. 6. Partially imaged right pleural effusion. 7. Opacity within the posterior left  upper lobe, which may reflect atelectasis or pneumonia. Electronically Signed   By: Jackey Loge D.O.   On: 10/17/2022 17:07   DG Chest 2 View  Result Date: 10/17/2022 CLINICAL DATA:  Shortness of breath, fell a week ago EXAM: CHEST - 2 VIEW COMPARISON:  02/04/2016, 08/01/2013 FINDINGS: Enlargement of cardiac silhouette. Mediastinal contours and pulmonary vascularity normal. Atherosclerotic calcification aorta. Bibasilar effusions and atelectasis. Upper lungs clear. No pneumothorax. Bones demineralized with progression of superior endplate compression deformity of a midthoracic vertebra and new vertebral plana of a lower thoracic vertebra since 2015. IMPRESSION: Bibasilar effusions and atelectasis. Enlargement of cardiac silhouette. Progressive compression fractures of mid and lower thoracic vertebra since 2015. Electronically Signed   By: Ulyses Southward M.D.   On: 10/17/2022 14:39    EKG: Atrial fibrillation rate now at around 100 nonspecific ST-T wave changes  ASSESSMENT AND PLAN:  Atrial fibrillation Rib fracture Pneumothorax COPD Compression fracture T8 History of bradycardia Hypertension Hyperlipidemia Diabetes Sleep apnea Hyperlipidemia Vertigo Frequent falls . Plan Agree admit to telemetry Follow-up EKGs troponins Currently on Cardizem IV we will try to wean to p.o. 30 mg every 6 hours as needed Recommend increasing metoprolol to 25 3 times a day as long as blood pressure tolerates I presently do not recommend long-term anticoagulation because his risk of falls and injuries recommend aspirin 81 mg only for now Discontinue heparin after 24 to 48 hours DVT prophylaxis while in hospital Inhalers as necessary for COPD Continue lipid management as necessary Physical therapy for balance training strength training to help prevent further falls Recommend conservative cardiac input at this stage Recommend rate control strategy for A-fib only as long as blood pressure  tolerates  Signed: Alwyn Pea MD, 10/18/2022, 12:49 PM

## 2022-10-18 NOTE — Progress Notes (Signed)
ANTICOAGULATION CONSULT NOTE - Initial Consult  Pharmacy Consult for Heparin  Indication: atrial fibrillation  No Known Allergies  Patient Measurements: Height: 5\' 10"  (177.8 cm) Weight: 67 kg (147 lb 11.3 oz) IBW/kg (Calculated) : 73 Heparin Dosing Weight: 67 kg   Vital Signs: BP: 93/64 (04/27 0200) Pulse Rate: 98 (04/27 0200)  Labs: Recent Labs    10/17/22 1439 10/17/22 1654 10/17/22 1956  HGB 13.8  --   --   HCT 41.0  --   --   PLT 185  --   --   APTT  --   --  35  LABPROT  --   --  15.8*  INR  --   --  1.3*  CREATININE  --  1.07  --   TROPONINIHS  --  13 11    Estimated Creatinine Clearance: 40 mL/min (by C-G formula based on SCr of 1.07 mg/dL).   Medical History: Past Medical History:  Diagnosis Date   Arthritis    B12 deficiency    Bladder cancer (HCC)    2 yrs ago   Diabetes (HCC) 09/14/2013   Dysrhythmia    Bradycardia   Hip fracture, left (HCC) 02/04/2016   Hypertension    Non-insulin dependent type 2 diabetes mellitus (HCC)    Sleep apnea     Medications:  (Not in a hospital admission)   Assessment: Pharmacy consulted to dose heparin in this 89 year old male admitted with Afib, not on anticoag previously.  CrCl = 40 ml/min   Goal of Therapy:  Heparin level 0.3-0.7 units/ml Monitor platelets by anticoagulation protocol: Yes   Plan:  No bolus per MD request.  Start heparin infusion at 950 units/hr Check anti-Xa level in 8 hours and daily while on heparin Continue to monitor H&H and platelets  Farin Buhman D 10/18/2022,2:19 AM

## 2022-10-18 NOTE — ED Notes (Signed)
Dr. Allena Katz messaged this RN and stated that the Diltiazem should started at 2.5 instead of the ordered 5.

## 2022-10-18 NOTE — Progress Notes (Signed)
Progress Note   Patient: Herbert Phillips ZOX:[id number] DOB: September 15, 1925 DOA: 10/17/2022     1 DOS: the patient was seen and examined on 10/18/2022   Brief hospital course: 89 year old man had a fall on Monday.  Complains of some shortness of breath.  Also having some weakness.  In the ER he is found to be in rapid atrial fibrillation.  CT scan of the chest showed a small pneumothorax and fractured ribs.  Patient was placed on Cardizem drip and heparin drip.  4/27.  Benefits and risks of blood thinner explained.  Switched heparin drip over to Eliquis.  Will speak more about whether or not to do blood thinner at home tomorrow.  Trying to taper off Cardizem drip by starting oral metoprolol and oral Cardizem.   Assessment and Plan: * Paroxysmal atrial fibrillation with RVR (HCC) Try to taper off Cardizem drip.  Started oral metoprolol this morning.  Cardiology titrated up metoprolol to 3 times a day and added p.o. Cardizem.  Heparin drip switched over to Eliquis.  Benefits and risks of blood thinner explained to family.  Will talk with them again tomorrow on whether or not to do blood thinners at home.    Closed traumatic fracture of ribs of left side with pneumothorax Rib fracture on the left side with a small pneumothorax less than 5% with anterior pneumomediastinum. Conservative management.  Patient not having any pain.  Type 2 diabetes mellitus with hyperlipidemia (HCC) Holding metformin.  On sliding scale.  Continue Mevacor.  HTN (hypertension) Blood pressure on the lower side.  Continue metoprolol and Cardizem as long as blood pressure allows.  Closed T12 fracture (HCC) No pain.  Conservative management.  Physical therapy evaluation.  History of gout On allopurinol        Subjective: Patient feels okay.  Hoping to go home soon because he has to meet with his lawyer on Monday.  No complaints of pain in the ribs or shortness of breath.  Admitted with rapid atrial  fibrillation.  Physical Exam: Vitals:   10/18/22 1000 10/18/22 1100 10/18/22 1200 10/18/22 1300  BP: (!) 96/57 104/63 (!) 96/54 (!) 131/105  Pulse: (!) 105 70 97 (!) 110  Resp: (!) 24 18 20 18   Temp:   (!) 96.8 F (36 C)   TempSrc:   Axillary   SpO2: 90% 91% 95% 91%  Weight:      Height:       Physical Exam HENT:     Head: Normocephalic.     Mouth/Throat:     Pharynx: No oropharyngeal exudate.  Eyes:     General: Lids are normal.     Conjunctiva/sclera: Conjunctivae normal.  Cardiovascular:     Rate and Rhythm: Normal rate. Rhythm irregularly irregular.     Heart sounds: Normal heart sounds, S1 normal and S2 normal.  Pulmonary:     Breath sounds: Examination of the right-lower field reveals decreased breath sounds. Examination of the left-lower field reveals decreased breath sounds. Decreased breath sounds present. No wheezing, rhonchi or rales.  Abdominal:     Palpations: Abdomen is soft.     Tenderness: There is no abdominal tenderness.  Musculoskeletal:     Right lower leg: No swelling.     Left lower leg: No swelling.  Skin:    General: Skin is warm.     Findings: No rash.  Neurological:     Mental Status: He is alert and oriented to person, place, and time.     Data  Reviewed: Hypoxic on 10.6, creatinine 1.09 with a GFR greater than 60 TSH 1.123  Family Communication: Spoke with daughter-in-law at the bedside  Disposition: Status is: Inpatient Remains inpatient appropriate because: Trying to taper off Cardizem drip.  Planned Discharge Destination: Home with Home Health    Time spent: 28 minutes  Author: Alford Highland, MD 10/18/2022 1:44 PM  For on call review [email or web address].

## 2022-10-18 NOTE — Assessment & Plan Note (Signed)
On allopurinol.  

## 2022-10-19 ENCOUNTER — Inpatient Hospital Stay: Payer: Medicare Other

## 2022-10-19 DIAGNOSIS — E1169 Type 2 diabetes mellitus with other specified complication: Secondary | ICD-10-CM | POA: Diagnosis not present

## 2022-10-19 DIAGNOSIS — R410 Disorientation, unspecified: Secondary | ICD-10-CM

## 2022-10-19 DIAGNOSIS — S2242XA Multiple fractures of ribs, left side, initial encounter for closed fracture: Secondary | ICD-10-CM | POA: Diagnosis not present

## 2022-10-19 DIAGNOSIS — I10 Essential (primary) hypertension: Secondary | ICD-10-CM | POA: Diagnosis not present

## 2022-10-19 LAB — CBC
HCT: 41.3 % (ref 39.0–52.0)
Hemoglobin: 13.9 g/dL (ref 13.0–17.0)
MCH: 31.3 pg (ref 26.0–34.0)
MCHC: 33.7 g/dL (ref 30.0–36.0)
MCV: 93 fL (ref 80.0–100.0)
Platelets: 205 10*3/uL (ref 150–400)
RBC: 4.44 MIL/uL (ref 4.22–5.81)
RDW: 14.1 % (ref 11.5–15.5)
WBC: 10.5 10*3/uL (ref 4.0–10.5)
nRBC: 0 % (ref 0.0–0.2)

## 2022-10-19 LAB — GLUCOSE, CAPILLARY
Glucose-Capillary: 121 mg/dL — ABNORMAL HIGH (ref 70–99)
Glucose-Capillary: 164 mg/dL — ABNORMAL HIGH (ref 70–99)
Glucose-Capillary: 167 mg/dL — ABNORMAL HIGH (ref 70–99)
Glucose-Capillary: 152 mg/dL — ABNORMAL HIGH (ref 70–99)

## 2022-10-19 MED ORDER — QUETIAPINE FUMARATE 25 MG PO TABS
12.5000 mg | ORAL_TABLET | Freq: Every evening | ORAL | Status: DC | PRN
  Administered 2022-10-19: 12.5 mg via ORAL
  Filled 2022-10-19: qty 1

## 2022-10-19 MED ORDER — ENOXAPARIN SODIUM 40 MG/0.4ML IJ SOSY
40.0000 mg | PREFILLED_SYRINGE | INTRAMUSCULAR | Status: DC
  Administered 2022-10-19: 40 mg via SUBCUTANEOUS
  Filled 2022-10-19: qty 0.4

## 2022-10-19 NOTE — Assessment & Plan Note (Signed)
Seroquel at night if agitated or not sleeping.

## 2022-10-19 NOTE — Progress Notes (Signed)
Pt removed both PIVs and is refusing to let this RN insert another one. Will try again in the morning.

## 2022-10-19 NOTE — Progress Notes (Signed)
Progress Note   Patient: Herbert Phillips [id number]:[id number] DOB: 1925/08/21 DOA: 10/17/2022     2 DOS: the patient was seen and examined on 10/19/2022   Brief hospital course: 89 year old man had a fall on Monday.  Complains of some shortness of breath.  Also having some weakness.  In the ER he is found to be in rapid atrial fibrillation.  CT scan of the chest showed a small pneumothorax and fractured ribs.  Patient was placed on Cardizem drip and heparin drip.  4/27.  Benefits and risks of blood thinner explained.  Switched heparin drip over to Eliquis.  Will speak more about whether or not to do blood thinner at home tomorrow.  Trying to taper off Cardizem drip by starting oral metoprolol and oral Cardizem. 428.  Patient with acute delirium and did not sleep last night.  Discussion with family and we will stop Eliquis.  Risk of fall and major bleed is very high in this patient.  They understand the risk of stroke is higher with those patients with atrial fibrillation.   Assessment and Plan: * Acute delirium Seroquel at night if agitated or not sleeping.  Paroxysmal atrial fibrillation with RVR (HCC) On oral Cardizem and oral metoprolol.  Discussed with family about stopping blood thinner.  The risk of him falling and bleeding is high.  Family wishes to stop blood thinner at this time.  They understand the risk of stroke is higher without blood thinner.    Closed traumatic fracture of ribs of left side with pneumothorax Rib fracture on the left side with a small pneumothorax less than 5% with anterior pneumomediastinum. Conservative management.  Patient not having any pain. Repeat chest x-ray still pending.  Type 2 diabetes mellitus with hyperlipidemia (HCC) Holding metformin.  On sliding scale.  Continue Mevacor.  HTN (hypertension) Blood pressure on the lower side.  Continue metoprolol and Cardizem as long as blood pressure allows.  Closed T12 fracture (HCC) No pain.   Conservative management.  Physical therapy evaluation.  History of gout On allopurinol        Subjective: Patient is very upset about not sleeping last night and very worn out and tired.  He is able to hold a conversation but some of what he is saying does not make sense.  Patient with acute delirium from being in the hospital.  Physical Exam: Vitals:   10/19/22 0330 10/19/22 0626 10/19/22 0800 10/19/22 1000  BP: (!) 119/99 119/69  101/71  Pulse: (!) 59  (!) 51 78  Resp: 19  (!) 23 19  Temp: 97.8 F (36.6 C)     TempSrc:      SpO2: (!) 89%  92% 93%  Weight:      Height:       Physical Exam HENT:     Head: Normocephalic.     Mouth/Throat:     Pharynx: No oropharyngeal exudate.  Eyes:     General: Lids are normal.     Conjunctiva/sclera: Conjunctivae normal.  Cardiovascular:     Rate and Rhythm: Normal rate. Rhythm irregularly irregular.     Heart sounds: Normal heart sounds, S1 normal and S2 normal.  Pulmonary:     Breath sounds: Examination of the right-lower field reveals decreased breath sounds. Examination of the left-lower field reveals decreased breath sounds. Decreased breath sounds present. No wheezing, rhonchi or rales.  Abdominal:     Palpations: Abdomen is soft.     Tenderness: There is no abdominal tenderness.  Musculoskeletal:  Right lower leg: No swelling.     Left lower leg: No swelling.  Skin:    General: Skin is warm.     Findings: No rash.  Neurological:     Mental Status: He is disoriented.     Data Reviewed: Repeat chest x-ray still pending read, CBC normal range  Family Communication:   Disposition: Status is: Inpatient Remains inpatient appropriate because: Patient with acute delirium  Planned Discharge Destination: Home with Home Health    Time spent: 28 minutes  Author: Alford Highland, MD 10/19/2022 11:57 AM  For on call review [email or web address].

## 2022-10-19 NOTE — Consult Note (Addendum)
Neurosurgery-New Consultation Evaluation 10/19/2022 Herbert Phillips [id number]  Identifying Statement: Herbert Phillips is a 89 y.o. male from [place] [id number] with thoracic compression fractures  Physician Requesting Consultation: Dr. Renae Gloss  History of Present Illness:89 y.o. male with past medical history of hypertension, bladder cancer in remission, sleep apnea, type 2 diabetes who presents with shortness of breath and weakness  No back pain.  He had undergone CT scan of the chest which demonstrated multiple levels of thoracic compression fracture of old age for which neurosurgery was consulted. Patient subsequently underwent CT scan imaging of the thoracic and lumbar spine as well as MRI imaging of the thoracic and lumbar spine as well.  He is currently in the ICU being evaluated for his hypoxia and altered mental status.   Past Medical History:  Past Medical History:  Diagnosis Date   Arthritis    B12 deficiency    Bladder cancer (HCC)    2 yrs ago   Diabetes (HCC) 09/14/2013   Dysrhythmia    Bradycardia   Hip fracture, left (HCC) 02/04/2016   Hypertension    Non-insulin dependent type 2 diabetes mellitus (HCC)    Sleep apnea     Social History: Social History   Socioeconomic History   Marital status: Widowed    Spouse name: Not on file   Number of children: Not on file   Years of education: Not on file   Highest education level: Not on file  Occupational History   Not on file  Tobacco Use   Smoking status: Former    Types: Cigarettes    Quit date: 06/13/1976    Years since quitting: 46.3   Smokeless tobacco: Never  Substance and Sexual Activity   Alcohol use: Yes    Comment: monthly 3 beers since xmas   Drug use: No   Sexual activity: Not on file  Other Topics Concern   Not on file  Social History Narrative   Not on file   Social Determinants of Health   Financial Resource Strain: Not on file  Food Insecurity: No Food Insecurity  (10/18/2022)   Hunger Vital Sign    Worried About Running Out of Food in the Last Year: Never true    Ran Out of Food in the Last Year: Never true  Transportation Needs: No Transportation Needs (10/18/2022)   PRAPARE - Administrator, Civil Service (Medical): No    Lack of Transportation (Non-Medical): No  Physical Activity: Not on file  Stress: Not on file  Social Connections: Not on file  Intimate Partner Violence: Not At Risk (10/18/2022)   Humiliation, Afraid, Rape, and Kick questionnaire    Fear of Current or Ex-Partner: No    Emotionally Abused: No    Physically Abused: No    Sexually Abused: No   Living arrangements (living alone, with partner): Lives in a facility  Family History: Family History  Problem Relation Age of Onset   Diabetes Neg Hx     Review of Systems:  Review of Systems - General ROS: Negative Psychological ROS: Negative Ophthalmic ROS: Negative ENT ROS: Negative Hematological and Lymphatic ROS: Negative  Endocrine ROS: Negative Respiratory ROS: Negative Cardiovascular ROS: Negative Gastrointestinal ROS: Negative Genito-Urinary ROS: Negative Musculoskeletal ROS: Negative Neurological ROS: Negative Dermatological ROS: Negative  Physical Exam: BP 119/69   Pulse (!) 59   Temp 97.8 F (36.6 C)   Resp 19   Ht 6' (1.829 m)   Wt 68.6 kg   SpO2 Marland Kitchen)  89%   BMI 20.51 kg/m  Body mass index is 20.51 kg/m. Body surface area is 1.87 meters squared. General appearance: Somewhat confused but otherwise redirectable Head: Normocephalic, atraumatic Eyes: Normal, EOM intact Oropharynx: Moist without lesions Neck: Supple, no tenderness Heart: Normal, regular rate and rhythm, without murmur Lungs: Regular breathing abdomen: Soft, nondistended Ext: No edema in LE bilaterally, good distal pulses   Neurologic exam:  Mental status: Alert to name Speech: fluent and redirectable cranial nerves:  II: Visual fields are full by confrontation, no  ptosis III/IV/VI: extra-ocular motions intact bilaterally V/VII:no evidence of facial droop or weakness and facial sensation intact VIII: hearing normal XI: trapezius strength symmetric,  sternocleidomastoid strength symmetric XII: tongue strength symmetric  Motor:strength symmetric 5/5, normal muscle mass and tone in all extremities and no pronator drift Sensory: intact to light touch in all extremities Reflexes: 2+ and symmetric bilaterally for arms and legs Coordination: Did not examine gait: Did not examine  Laboratory: Results for orders placed or performed during the hospital encounter of 10/17/22  MRSA Next Gen by PCR, Nasal   Specimen: Nasal Mucosa; Nasal Swab  Result Value Ref Range   MRSA by PCR Next Gen NOT DETECTED NOT DETECTED  CBC  Result Value Ref Range   WBC 12.7 (H) 4.0 - 10.5 K/uL   RBC 4.37 4.22 - 5.81 MIL/uL   Hemoglobin 13.8 13.0 - 17.0 g/dL   HCT 16.1 09.6 - 04.5 %   MCV 93.8 80.0 - 100.0 fL   MCH 31.6 26.0 - 34.0 pg   MCHC 33.7 30.0 - 36.0 g/dL   RDW 40.9 81.1 - 91.4 %   Platelets 185 150 - 400 K/uL   nRBC 0.0 0.0 - 0.2 %  Comprehensive metabolic panel  Result Value Ref Range   Sodium 137 135 - 145 mmol/L   Potassium 3.9 3.5 - 5.1 mmol/L   Chloride 104 98 - 111 mmol/L   CO2 22 22 - 32 mmol/L   Glucose, Bld 109 (H) 70 - 99 mg/dL   BUN 41 (H) 8 - 23 mg/dL   Creatinine, Ser 7.82 0.61 - 1.24 mg/dL   Calcium 8.5 (L) 8.9 - 10.3 mg/dL   Total Protein 6.6 6.5 - 8.1 g/dL   Albumin 3.0 (L) 3.5 - 5.0 g/dL   AST 20 15 - 41 U/L   ALT 17 0 - 44 U/L   Alkaline Phosphatase 64 38 - 126 U/L   Total Bilirubin 0.7 0.3 - 1.2 mg/dL   GFR, Estimated >95 >62 mL/min   Anion gap 11 5 - 15  Brain natriuretic peptide  Result Value Ref Range   B Natriuretic Peptide 498.5 (H) 0.0 - 100.0 pg/mL  Urinalysis, Routine w reflex microscopic -Urine, Clean Catch  Result Value Ref Range   Color, Urine YELLOW (A) YELLOW   APPearance CLEAR (A) CLEAR   Specific Gravity, Urine 1.023  1.005 - 1.030   pH 5.0 5.0 - 8.0   Glucose, UA NEGATIVE NEGATIVE mg/dL   Hgb urine dipstick NEGATIVE NEGATIVE   Bilirubin Urine NEGATIVE NEGATIVE   Ketones, ur 5 (A) NEGATIVE mg/dL   Protein, ur NEGATIVE NEGATIVE mg/dL   Nitrite NEGATIVE NEGATIVE   Leukocytes,Ua NEGATIVE NEGATIVE  Protime-INR  Result Value Ref Range   Prothrombin Time 15.8 (H) 11.4 - 15.2 seconds   INR 1.3 (H) 0.8 - 1.2  APTT  Result Value Ref Range   aPTT 35 24 - 36 seconds  Comprehensive metabolic panel  Result Value Ref Range  Sodium 138 135 - 145 mmol/L   Potassium 3.6 3.5 - 5.1 mmol/L   Chloride 104 98 - 111 mmol/L   CO2 24 22 - 32 mmol/L   Glucose, Bld 133 (H) 70 - 99 mg/dL   BUN 37 (H) 8 - 23 mg/dL   Creatinine, Ser 4.54 0.61 - 1.24 mg/dL   Calcium 8.3 (L) 8.9 - 10.3 mg/dL   Total Protein 6.6 6.5 - 8.1 g/dL   Albumin 2.9 (L) 3.5 - 5.0 g/dL   AST 15 15 - 41 U/L   ALT 16 0 - 44 U/L   Alkaline Phosphatase 71 38 - 126 U/L   Total Bilirubin 1.0 0.3 - 1.2 mg/dL   GFR, Estimated >09 >81 mL/min   Anion gap 10 5 - 15  CBC with Differential/Platelet  Result Value Ref Range   WBC 10.6 (H) 4.0 - 10.5 K/uL   RBC 4.37 4.22 - 5.81 MIL/uL   Hemoglobin 13.7 13.0 - 17.0 g/dL   HCT 19.1 47.8 - 29.5 %   MCV 95.0 80.0 - 100.0 fL   MCH 31.4 26.0 - 34.0 pg   MCHC 33.0 30.0 - 36.0 g/dL   RDW 62.1 30.8 - 65.7 %   Platelets 204 150 - 400 K/uL   nRBC 0.0 0.0 - 0.2 %   Neutrophils Relative % 70 %   Neutro Abs 7.7 1.7 - 7.7 K/uL   Lymphocytes Relative 14 %   Lymphs Abs 1.4 0.7 - 4.0 K/uL   Monocytes Relative 13 %   Monocytes Absolute 1.3 (H) 0.1 - 1.0 K/uL   Eosinophils Relative 1 %   Eosinophils Absolute 0.1 0.0 - 0.5 K/uL   Basophils Relative 1 %   Basophils Absolute 0.1 0.0 - 0.1 K/uL   Immature Granulocytes 1 %   Abs Immature Granulocytes 0.09 (H) 0.00 - 0.07 K/uL  Phosphorus  Result Value Ref Range   Phosphorus 4.1 2.5 - 4.6 mg/dL  Blood gas, venous  Result Value Ref Range   pH, Ven 7.34 7.25 - 7.43    pCO2, Ven 24 (L) 44 - 60 mmHg   pO2, Ven 224 (H) 32 - 45 mmHg   Bicarbonate 12.9 (L) 20.0 - 28.0 mmol/L   Acid-base deficit 11.0 (H) 0.0 - 2.0 mmol/L   O2 Saturation 99.7 %   Patient temperature 37.0   Hemoglobin A1c  Result Value Ref Range   Hgb A1c MFr Bld 6.1 (H) 4.8 - 5.6 %   Mean Plasma Glucose 128.37 mg/dL  Magnesium  Result Value Ref Range   Magnesium 2.2 1.7 - 2.4 mg/dL  T4, free  Result Value Ref Range   Free T4 1.28 (H) 0.61 - 1.12 ng/dL  TSH  Result Value Ref Range   TSH 1.123 0.350 - 4.500 uIU/mL  Heparin level (unfractionated)  Result Value Ref Range   Heparin Unfractionated <0.10 (L) 0.30 - 0.70 IU/mL  Glucose, capillary  Result Value Ref Range   Glucose-Capillary 102 (H) 70 - 99 mg/dL  Glucose, capillary  Result Value Ref Range   Glucose-Capillary 159 (H) 70 - 99 mg/dL  CBC  Result Value Ref Range   WBC 10.5 4.0 - 10.5 K/uL   RBC 4.44 4.22 - 5.81 MIL/uL   Hemoglobin 13.9 13.0 - 17.0 g/dL   HCT 84.6 96.2 - 95.2 %   MCV 93.0 80.0 - 100.0 fL   MCH 31.3 26.0 - 34.0 pg   MCHC 33.7 30.0 - 36.0 g/dL   RDW 84.1 32.4 - 40.1 %  Platelets 205 150 - 400 K/uL   nRBC 0.0 0.0 - 0.2 %  Glucose, capillary  Result Value Ref Range   Glucose-Capillary 157 (H) 70 - 99 mg/dL  Glucose, capillary  Result Value Ref Range   Glucose-Capillary 121 (H) 70 - 99 mg/dL  Troponin I (High Sensitivity)  Result Value Ref Range   Troponin I (High Sensitivity) 13 <18 ng/L  Troponin I (High Sensitivity)  Result Value Ref Range   Troponin I (High Sensitivity) 11 <18 ng/L   I personally reviewed labs  Imaging:  IMPRESSION: 1. Tiny, less than 5% left pneumothorax. 2. Small volume anterior pneumomediastinum. 3. Moderate left, small right pleural effusions and associated atelectasis or consolidation. 4. Acute superior endplate deformity of T12. Unchanged severe anterior wedge deformity of T8. Age indeterminate anterior wedge deformity of T2. 5. Paraspinous soft tissue stranding  about T12 without evidence of discrete fluid collection or epidural hematoma. 6. Very subtle, nondisplaced fractures of the posterior left third and fourth ribs 7. Subacute fractures of the anterior left seventh and eighth ribs, without acute displaced rib fracture 8. No CT evidence of acute traumatic injury to the organs of the abdomen or pelvis. 9. Emphysema. 10. Coronary artery disease.   These results were called by telephone at the time of interpretation on 10/17/2022 at 8:11 pm to Dr. Tresa Endo [facility] , who verbally acknowledged these results.   Aortic Atherosclerosis (ICD10-I70.0) and Emphysema (ICD10-J43.9).       Electronically Signed   By: Jearld Lesch M.D.   On: 10/17/2022 20:20          I personally reviewed radiology studies to include:   Impression/Plan:   Patient is a 89 year old male past medical history significant for hypoxia for which he presents is admitted to the hospital in the ICU currently for PAD and evaluation of altered mental status that was identified on CT scan imaging of the chest in the setting of hypoxia to have an end date plate deformity of T12 consistent with wedge fracture as well as a previous unchanged severe anterior wedge deformity of T8 as well as an age-indeterminate anterior wedge deformity at T2.  He has baseline kyphosis.    1.  Diagnosis thoracic compression fracture  2.  Plan As previously noted, TLSO when out of bed for comfort as needed.  Would be fine to mobilize without the brace as needed to prevent any restrictions from his lung volumes or breathing. No neurosurgical intervention We will arrange outpatient follow-up for him in clinic Please call with questions  Peter Garter. Madaline Brilliant, MD Neurosurgery

## 2022-10-19 NOTE — Progress Notes (Signed)
[facility] Cardiology    SUBJECTIVE: Resting quietly did not sleep well last night had some delirium.  Patient seems to be doing reasonably well Case discussed with his daughter-in-law   Vitals:   10/19/22 0800 10/19/22 1000 10/19/22 1100 10/19/22 1200  BP:  101/71 117/78 (!) 97/58  Pulse: (!) 51 78 (!) 50 (!) 41  Resp: (!) 23 19 (!) 24 18  Temp:    97.8 F (36.6 C)  TempSrc:    Axillary  SpO2: 92% 93% 95% 94%  Weight:      Height:         Intake/Output Summary (Last 24 hours) at 10/19/2022 1255 Last data filed at 10/19/2022 0429 Gross per 24 hour  Intake 81 ml  Output 800 ml  Net -719 ml      PHYSICAL EXAM  General: Well developed, well nourished, in no acute distress HEENT:  Normocephalic and atramatic Neck:  No JVD.  Lungs: Clear bilaterally to auscultation and percussion. Heart: HRRR . Normal S1 and S2 without gallops or murmurs.  Abdomen: Bowel sounds are positive, abdomen soft and non-tender  Msk:  Back normal, normal gait. Normal strength and tone for age. Extremities: No clubbing, cyanosis or edema.   Neuro: Alert and oriented X 3. Psych:  Good affect, responds appropriately   LABS: Basic Metabolic Panel: Recent Labs    10/17/22 1654 10/18/22 0620  NA 137 138  K 3.9 3.6  CL 104 104  CO2 22 24  GLUCOSE 109* 133*  BUN 41* 37*  CREATININE 1.07 1.09  CALCIUM 8.5* 8.3*  MG  --  2.2  PHOS  --  4.1   Liver Function Tests: Recent Labs    10/17/22 1654 10/18/22 0620  AST 20 15  ALT 17 16  ALKPHOS 64 71  BILITOT 0.7 1.0  PROT 6.6 6.6  ALBUMIN 3.0* 2.9*   No results for input(s): "LIPASE", "AMYLASE" in the last 72 hours. CBC: Recent Labs    10/18/22 0620 10/19/22 0339  WBC 10.6* 10.5  NEUTROABS 7.7  --   HGB 13.7 13.9  HCT 41.5 41.3  MCV 95.0 93.0  PLT 204 205   Cardiac Enzymes: No results for input(s): "CKTOTAL", "CKMB", "CKMBINDEX", "TROPONINI" in the last 72 hours. BNP: Invalid input(s): "POCBNP" D-Dimer: No results for input(s):  "DDIMER" in the last 72 hours. Hemoglobin A1C: Recent Labs    10/18/22 0620  HGBA1C 6.1*   Fasting Lipid Panel: No results for input(s): "CHOL", "HDL", "LDLCALC", "TRIG", "CHOLHDL", "LDLDIRECT" in the last 72 hours. Thyroid Function Tests: Recent Labs    10/18/22 0620  TSH 1.123   Anemia Panel: No results for input(s): "VITAMINB12", "FOLATE", "FERRITIN", "TIBC", "IRON", "RETICCTPCT" in the last 72 hours.  DG Chest 2 View  Result Date: 10/19/2022 CLINICAL DATA:  Pneumothorax. Atrial fibrillation. EXAM: CHEST - 2 VIEW COMPARISON:  Radiograph and CT 10/17/2022 FINDINGS: The small left pneumothorax on prior CT is not seen by radiograph. The pneumomediastinum on prior CT is also not seen. Upper normal heart size. Stable mediastinal contours with aortic atherosclerosis. There are small to moderate bilateral pleural effusions, left greater than right. Slight increase in size of pleural effusions from prior exam. Grossly stable compression deformities from recent imaging. IMPRESSION: 1. Small to moderate bilateral pleural effusions, left greater than right. Slight increase in size from prior exam. 2. The small left pneumothorax on prior CT is not seen by radiograph. The pneumomediastinum on prior CT is also not seen radiographically. Electronically Signed   By: Shawna Orleans  Sanford M.D.   On: 10/19/2022 12:14   CT CHEST ABDOMEN PELVIS W CONTRAST  Result Date: 10/17/2022 CLINICAL DATA:  Fall 1 week ago, shortness of breath, bruising to back EXAM: CT CHEST, ABDOMEN, AND PELVIS WITH CONTRAST CT THORACIC SPINE WITH CONTRAST TECHNIQUE: Multidetector CT imaging of the chest, abdomen and pelvis was performed following the standard protocol during bolus administration of intravenous contrast. Multidetector CT imaging of the thoracic spine was performed following the standard protocol during bolus administration of intravenous contrast. RADIATION DOSE REDUCTION: This exam was performed according to the  departmental dose-optimization program which includes automated exposure control, adjustment of the mA and/or kV according to patient size and/or use of iterative reconstruction technique. CONTRAST:  OMNIPAQUE IOHEXOL 300 MG/ML  SOLN COMPARISON:  CT abdomen pelvis, 06/08/2020 chest radiographs, 10/05/2014 FINDINGS: CT CHEST FINDINGS Cardiovascular: Aortic atherosclerosis. Normal cardiomegaly. Three-vessel coronary artery calcifications. No pericardial effusion. Mediastinum/Nodes: No enlarged mediastinal, hilar, or axillary lymph nodes. Small volume anterior pneumomediastinum (series 2, image 42). Thyroid gland, trachea, and esophagus demonstrate no significant findings. Lungs/Pleura: Moderate left, small right pleural effusions and associated atelectasis or consolidation. Mild centrilobular emphysema. Tiny, less than 5% left pneumothorax (series 4, image 34). Musculoskeletal: No chest wall mass or suspicious osseous lesions identified. Very subtle, nondisplaced fractures of the posterior left third and fourth ribs, much better appreciated by comparison examination of the cervical spine (series 4, image 19). Subacute fractures of the anterior left seventh and eighth ribs, without acute displaced rib fracture (series 2, image 53, 59). CT ABDOMEN PELVIS FINDINGS Hepatobiliary: No solid liver abnormality is seen. No gallstones, gallbladder wall thickening, or biliary dilatation. Pancreas: Unremarkable. No pancreatic ductal dilatation or surrounding inflammatory changes. Spleen: Normal in size without significant abnormality. Adrenals/Urinary Tract: Adrenal glands are unremarkable. Kidneys are normal, without renal calculi, solid lesion, or hydronephrosis. Bladder is unremarkable. Stomach/Bowel: Stomach is within normal limits. Appendix is normal. No evidence of bowel wall thickening, distention, or inflammatory changes. Vascular/Lymphatic: Aortic atherosclerosis. No enlarged abdominal or pelvic lymph nodes.  Reproductive: No mass or other abnormality. Other: Large right inguinal hernia containing the cecal base, incompletely imaged (series 2, image 120) no ascites. Musculoskeletal: No acute osseous findings. CT THORACIC SPINE FINDINGS Alignment: Exaggerated thoracic kyphosis. Vertebral bodies: Acute superior endplate deformity of T12 (series 5, image 62). Unchanged severe anterior wedge deformity of T8 (series 5, image 60). Age indeterminate anterior wedge deformity of T2 (series 5, image 60). Disc spaces: Intact. Paraspinous soft tissues: Paraspinous soft tissue stranding about T12 without evidence of discrete fluid collection or epidural hematoma (series 1, image 36). IMPRESSION: 1. Tiny, less than 5% left pneumothorax. 2. Small volume anterior pneumomediastinum. 3. Moderate left, small right pleural effusions and associated atelectasis or consolidation. 4. Acute superior endplate deformity of T12. Unchanged severe anterior wedge deformity of T8. Age indeterminate anterior wedge deformity of T2. 5. Paraspinous soft tissue stranding about T12 without evidence of discrete fluid collection or epidural hematoma. 6. Very subtle, nondisplaced fractures of the posterior left third and fourth ribs 7. Subacute fractures of the anterior left seventh and eighth ribs, without acute displaced rib fracture 8. No CT evidence of acute traumatic injury to the organs of the abdomen or pelvis. 9. Emphysema. 10. Coronary artery disease. These results were called by telephone at the time of interpretation on 10/17/2022 at 8:11 pm to Dr. Tresa Endo [facility] , who verbally acknowledged these results. Aortic Atherosclerosis (ICD10-I70.0) and Emphysema (ICD10-J43.9). Electronically Signed   By: Jearld Lesch M.D.   On: 10/17/2022  20:20   CT T-SPINE NO CHARGE  Result Date: 10/17/2022 CLINICAL DATA:  Fall 1 week ago, shortness of breath, bruising to back EXAM: CT CHEST, ABDOMEN, AND PELVIS WITH CONTRAST CT THORACIC SPINE WITH CONTRAST TECHNIQUE:  Multidetector CT imaging of the chest, abdomen and pelvis was performed following the standard protocol during bolus administration of intravenous contrast. Multidetector CT imaging of the thoracic spine was performed following the standard protocol during bolus administration of intravenous contrast. RADIATION DOSE REDUCTION: This exam was performed according to the departmental dose-optimization program which includes automated exposure control, adjustment of the mA and/or kV according to patient size and/or use of iterative reconstruction technique. CONTRAST:  OMNIPAQUE IOHEXOL 300 MG/ML  SOLN COMPARISON:  CT abdomen pelvis, 06/08/2020 chest radiographs, 10/05/2014 FINDINGS: CT CHEST FINDINGS Cardiovascular: Aortic atherosclerosis. Normal cardiomegaly. Three-vessel coronary artery calcifications. No pericardial effusion. Mediastinum/Nodes: No enlarged mediastinal, hilar, or axillary lymph nodes. Small volume anterior pneumomediastinum (series 2, image 42). Thyroid gland, trachea, and esophagus demonstrate no significant findings. Lungs/Pleura: Moderate left, small right pleural effusions and associated atelectasis or consolidation. Mild centrilobular emphysema. Tiny, less than 5% left pneumothorax (series 4, image 34). Musculoskeletal: No chest wall mass or suspicious osseous lesions identified. Very subtle, nondisplaced fractures of the posterior left third and fourth ribs, much better appreciated by comparison examination of the cervical spine (series 4, image 19). Subacute fractures of the anterior left seventh and eighth ribs, without acute displaced rib fracture (series 2, image 53, 59). CT ABDOMEN PELVIS FINDINGS Hepatobiliary: No solid liver abnormality is seen. No gallstones, gallbladder wall thickening, or biliary dilatation. Pancreas: Unremarkable. No pancreatic ductal dilatation or surrounding inflammatory changes. Spleen: Normal in size without significant abnormality. Adrenals/Urinary Tract:  Adrenal glands are unremarkable. Kidneys are normal, without renal calculi, solid lesion, or hydronephrosis. Bladder is unremarkable. Stomach/Bowel: Stomach is within normal limits. Appendix is normal. No evidence of bowel wall thickening, distention, or inflammatory changes. Vascular/Lymphatic: Aortic atherosclerosis. No enlarged abdominal or pelvic lymph nodes. Reproductive: No mass or other abnormality. Other: Large right inguinal hernia containing the cecal base, incompletely imaged (series 2, image 120) no ascites. Musculoskeletal: No acute osseous findings. CT THORACIC SPINE FINDINGS Alignment: Exaggerated thoracic kyphosis. Vertebral bodies: Acute superior endplate deformity of T12 (series 5, image 62). Unchanged severe anterior wedge deformity of T8 (series 5, image 60). Age indeterminate anterior wedge deformity of T2 (series 5, image 60). Disc spaces: Intact. Paraspinous soft tissues: Paraspinous soft tissue stranding about T12 without evidence of discrete fluid collection or epidural hematoma (series 1, image 36). IMPRESSION: 1. Tiny, less than 5% left pneumothorax. 2. Small volume anterior pneumomediastinum. 3. Moderate left, small right pleural effusions and associated atelectasis or consolidation. 4. Acute superior endplate deformity of T12. Unchanged severe anterior wedge deformity of T8. Age indeterminate anterior wedge deformity of T2. 5. Paraspinous soft tissue stranding about T12 without evidence of discrete fluid collection or epidural hematoma. 6. Very subtle, nondisplaced fractures of the posterior left third and fourth ribs 7. Subacute fractures of the anterior left seventh and eighth ribs, without acute displaced rib fracture 8. No CT evidence of acute traumatic injury to the organs of the abdomen or pelvis. 9. Emphysema. 10. Coronary artery disease. These results were called by telephone at the time of interpretation on 10/17/2022 at 8:11 pm to Dr. Tresa Endo [facility] , who verbally acknowledged  these results. Aortic Atherosclerosis (ICD10-I70.0) and Emphysema (ICD10-J43.9). Electronically Signed   By: Jearld Lesch M.D.   On: 10/17/2022 20:20   CT HEAD WO CONTRAST (  )  Result Date: 10/17/2022 CLINICAL DATA:  Provided history: Head trauma, minor. Neck trauma. EXAM: CT HEAD WITHOUT CONTRAST CT CERVICAL SPINE WITHOUT CONTRAST TECHNIQUE: Multidetector CT imaging of the head and cervical spine was performed following the standard protocol without intravenous contrast. Multiplanar CT image reconstructions of the cervical spine were also generated. RADIATION DOSE REDUCTION: This exam was performed according to the departmental dose-optimization program which includes automated exposure control, adjustment of the mA and/or kV according to patient size and/or use of iterative reconstruction technique. COMPARISON:  Prior CT head and CT cervical spine 05/28/2020. FINDINGS: CT HEAD FINDINGS Brain: Moderate to advanced generalized cerebral atrophy. Patchy and ill-defined hypoattenuation within the cerebral white matter, nonspecific but compatible with moderate chronic small vessel ischemic disease. 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 There is no acute intracranial hemorrhage. No demarcated cortical infarct. No extra-axial fluid collection. No evidence of an intracranial mass. No midline shift. Vascular: No hyperdense vessel. Atherosclerotic calcifications. Skull: No fracture or aggressive osseous lesion. Sinuses/Orbits: No mass or acute finding within the imaged orbits. Moderate mucosal thickening within the right sphenoid sinus. Mild mucosal thickening, and possible small fluid level, within the left sphenoid sinus. Mild mucosal thickening scattered within bilateral ethmoid air cells. CT CERVICAL SPINE FINDINGS Alignment: 2 mm C7-T1 grade 1 anterolisthesis. Skull base and vertebrae: The basion-dental and atlanto-dental intervals are maintained.No evidence of acute  fracture to the cervical spine. Age-indeterminate T2 anterior wedge vertebral compression fracture (40% height loss). Soft tissues and spinal canal: No prevertebral fluid or swelling. No visible canal hematoma. Disc levels: Cervical spondylosis with multilevel disc space narrowing, disc bulges/central disc protrusions, uncovertebral hypertrophy and facet arthrosis. Disc space narrowing is advanced at C6-C7 and there is a posterior disc osteophyte complex at this level. No appreciable high-grade spinal canal stenosis. Multilevel bony neural foraminal narrowing. Upper chest: Small left pneumothorax. Partially imaged left pleural fluid collection measuring up to 92 Hounsfield units in density, suspicious for hemothorax. Partially imaged right pleural effusion. Opacity within the posterior left upper lobe which may reflect atelectasis or pneumonia. Biapical pleuroparenchymal scarring. Other: Acute, nondisplaced fractures of the posterior left third, fourth and fifth ribs. CT cervical spine impressions #2, #3, #4 and #5 called by telephone at the time of interpretation on 10/17/2022 at 5:03 pm to provider [facility] , who verbally acknowledged these results. IMPRESSION: CT head: 1.  No evidence of an acute intracranial abnormality. 2. Moderate chronic small vessel ischemic changes within the cerebral white matter. 3. 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 4. Moderate to advanced generalized cerebral atrophy. 5. Paranasal sinus disease, as described. CT cervical spine: 1. No evidence of acute fracture to the cervical spine. 2. Age-indeterminate T2 anterior wedge vertebral compression fracture. No bony retropulsion. 3. Small left pneumothorax. 4. Partly imaged left pleural fluid collection measuring up to 92 Hounsfield units in density, suspicious for hemothorax. A CT of the chest is recommended for further evaluation. 5. Acute, nondisplaced fractures of the posterior left third, fourth  and fifth ribs. 6. Partially imaged right pleural effusion. 7. Opacity within the posterior left upper lobe, which may reflect atelectasis or pneumonia. Electronically Signed   By: Jackey Loge D.O.   On: 10/17/2022 17:07   CT Cervical Spine Wo Contrast  Result Date: 10/17/2022 CLINICAL DATA:  Provided history: Head trauma, minor. Neck trauma. EXAM: CT HEAD WITHOUT CONTRAST CT CERVICAL SPINE WITHOUT CONTRAST TECHNIQUE: Multidetector CT imaging of the head and cervical spine was performed following the standard protocol  without intravenous contrast. Multiplanar CT image reconstructions of the cervical spine were also generated. RADIATION DOSE REDUCTION: This exam was performed according to the departmental dose-optimization program which includes automated exposure control, adjustment of the mA and/or kV according to patient size and/or use of iterative reconstruction technique. COMPARISON:  Prior CT head and CT cervical spine 05/28/2020. FINDINGS: CT HEAD FINDINGS Brain: Moderate to advanced generalized cerebral atrophy. Patchy and ill-defined hypoattenuation within the cerebral white matter, nonspecific but compatible with moderate chronic small vessel ischemic disease. 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 There is no acute intracranial hemorrhage. No demarcated cortical infarct. No extra-axial fluid collection. No evidence of an intracranial mass. No midline shift. Vascular: No hyperdense vessel. Atherosclerotic calcifications. Skull: No fracture or aggressive osseous lesion. Sinuses/Orbits: No mass or acute finding within the imaged orbits. Moderate mucosal thickening within the right sphenoid sinus. Mild mucosal thickening, and possible small fluid level, within the left sphenoid sinus. Mild mucosal thickening scattered within bilateral ethmoid air cells. CT CERVICAL SPINE FINDINGS Alignment: 2 mm C7-T1 grade 1 anterolisthesis. Skull base and vertebrae: The  basion-dental and atlanto-dental intervals are maintained.No evidence of acute fracture to the cervical spine. Age-indeterminate T2 anterior wedge vertebral compression fracture (40% height loss). Soft tissues and spinal canal: No prevertebral fluid or swelling. No visible canal hematoma. Disc levels: Cervical spondylosis with multilevel disc space narrowing, disc bulges/central disc protrusions, uncovertebral hypertrophy and facet arthrosis. Disc space narrowing is advanced at C6-C7 and there is a posterior disc osteophyte complex at this level. No appreciable high-grade spinal canal stenosis. Multilevel bony neural foraminal narrowing. Upper chest: Small left pneumothorax. Partially imaged left pleural fluid collection measuring up to 92 Hounsfield units in density, suspicious for hemothorax. Partially imaged right pleural effusion. Opacity within the posterior left upper lobe which may reflect atelectasis or pneumonia. Biapical pleuroparenchymal scarring. Other: Acute, nondisplaced fractures of the posterior left third, fourth and fifth ribs. CT cervical spine impressions #2, #3, #4 and #5 called by telephone at the time of interpretation on 10/17/2022 at 5:03 pm to provider [facility] , who verbally acknowledged these results. IMPRESSION: CT head: 1.  No evidence of an acute intracranial abnormality. 2. Moderate chronic small vessel ischemic changes within the cerebral white matter. 3. 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 4. Moderate to advanced generalized cerebral atrophy. 5. Paranasal sinus disease, as described. CT cervical spine: 1. No evidence of acute fracture to the cervical spine. 2. Age-indeterminate T2 anterior wedge vertebral compression fracture. No bony retropulsion. 3. Small left pneumothorax. 4. Partly imaged left pleural fluid collection measuring up to 92 Hounsfield units in density, suspicious for hemothorax. A CT of the chest is recommended for further  evaluation. 5. Acute, nondisplaced fractures of the posterior left third, fourth and fifth ribs. 6. Partially imaged right pleural effusion. 7. Opacity within the posterior left upper lobe, which may reflect atelectasis or pneumonia. Electronically Signed   By: Jackey Loge D.O.   On: 10/17/2022 17:07   DG Chest 2 View  Result Date: 10/17/2022 CLINICAL DATA:  Shortness of breath, fell a week ago EXAM: CHEST - 2 VIEW COMPARISON:  02/04/2016, 08/01/2013 FINDINGS: Enlargement of cardiac silhouette. Mediastinal contours and pulmonary vascularity normal. Atherosclerotic calcification aorta. Bibasilar effusions and atelectasis. Upper lungs clear. No pneumothorax. Bones demineralized with progression of superior endplate compression deformity of a midthoracic vertebra and new vertebral plana of a lower thoracic vertebra since 2015. IMPRESSION: Bibasilar effusions and atelectasis. Enlargement of cardiac silhouette. Progressive compression fractures  of mid and lower thoracic vertebra since 2015. Electronically Signed   By: Ulyses Southward M.D.   On: 10/17/2022 14:39     Echo pending  TELEMETRY: Atrial fibrillation rate of around 90 nonspecific ST-T changes:  ASSESSMENT AND PLAN:  Principal Problem:   Acute delirium Active Problems:   HTN (hypertension)   Type 2 diabetes mellitus with hyperlipidemia (HCC)   Paroxysmal atrial fibrillation with RVR (HCC)   Closed traumatic fracture of ribs of left side with pneumothorax   Closed T12 fracture (HCC)   History of gout    Plan Atrial fibrillation paroxysmal rate controlled transition from Cardizem to p.o. as well as metoprolol Do not recommend long-term anticoagulation with Eliquis only DVT prophylaxis while hospitalized Risk of falls making long-term anticoagulation for A-fib problematic and I recommend no long-term anticoagulation besides aspirin Hypertension reasonably controlled ramipril metoprolol diltiazem Diabetes type 2 continue  metformin Hyperlipidemia continue lovastatin therapy for lipid management Shortness of breath COPD continue continue Demadex inhalers follow-up with pulmonary as necessary Recommend transfer to telemetry Hopefully recommend physical therapy before transitioning home    Alwyn Pea, MD 10/19/2022 12:55 PM

## 2022-10-20 ENCOUNTER — Telehealth: Payer: Self-pay

## 2022-10-20 DIAGNOSIS — I4891 Unspecified atrial fibrillation: Secondary | ICD-10-CM | POA: Diagnosis not present

## 2022-10-20 DIAGNOSIS — R410 Disorientation, unspecified: Secondary | ICD-10-CM | POA: Diagnosis not present

## 2022-10-20 DIAGNOSIS — I480 Paroxysmal atrial fibrillation: Secondary | ICD-10-CM | POA: Diagnosis not present

## 2022-10-20 DIAGNOSIS — S2242XA Multiple fractures of ribs, left side, initial encounter for closed fracture: Secondary | ICD-10-CM | POA: Diagnosis not present

## 2022-10-20 LAB — GLUCOSE, CAPILLARY
Glucose-Capillary: 100 mg/dL — ABNORMAL HIGH (ref 70–99)
Glucose-Capillary: 171 mg/dL — ABNORMAL HIGH (ref 70–99)

## 2022-10-20 MED ORDER — DILTIAZEM HCL 30 MG PO TABS: 60.0000 mg | ORAL_TABLET | Freq: Two times a day (BID) | ORAL | Status: DC

## 2022-10-20 MED ORDER — DILTIAZEM HCL ER COATED BEADS 120 MG PO CP24: 120.0000 mg | ORAL_CAPSULE | Freq: Every day | ORAL | 0 refills | Status: DC

## 2022-10-20 MED ORDER — METOPROLOL TARTRATE 50 MG PO TABS: 50.0000 mg | ORAL_TABLET | Freq: Two times a day (BID) | ORAL | 0 refills | Status: DC

## 2022-10-20 MED ORDER — DILTIAZEM HCL ER COATED BEADS 120 MG PO CP24: 120.0000 mg | ORAL_CAPSULE | Freq: Every day | ORAL | Status: DC

## 2022-10-20 MED ORDER — METOPROLOL TARTRATE 50 MG PO TABS
50.0000 mg | ORAL_TABLET | Freq: Two times a day (BID) | ORAL | Status: DC
  Administered 2022-10-20: 50 mg via ORAL
  Filled 2022-10-20: qty 1

## 2022-10-20 NOTE — TOC Transition Note (Signed)
Transition of Care [facility]) - CM/SW Discharge Note   Patient Details  Name: Herbert Phillips MRN: [id number] Date of Birth: 1926/06/05  Transition of Care [facility] CM/SW Contact:  Margarito Liner, LCSW Phone Number: 10/20/2022, 11:22 AM   Clinical Narrative:  Patient has orders to discharge to [facility] today. He lives on the ALF side but they want him to go to the rehab side and daughter-in-law is agreeable. She is aware of home health recommendations and potential for insurance not to cover depending on how he does. Patient has order for inpatient status on 4/26 so he has met 3-night stay. Daughter-in-law is requesting EMS transport and is aware he may receive a bill later on. Transport set up for 12:15 pm. There are 7 ahead of him on the list. RN will call report to [phone] (Room 330). No further concerns. CSW signing off.   Final next level of care: Skilled Nursing Facility Barriers to Discharge: No Barriers Identified   Patient Goals and CMS Choice   Choice offered to / list presented to : Adult Children  Discharge Placement     Existing PASRR number confirmed : 10/20/22          Patient chooses bed at: Other - please specify in the comment section below: ([facility]) Patient to be transferred to facility by: EMS Name of family member notified: Herbert Phillips Patient and family notified of of transfer: 10/20/22  Discharge Plan and Services Additional resources added to the After Visit Summary for                                       Social Determinants of Health (SDOH) Interventions SDOH Screenings   Food Insecurity: No Food Insecurity (10/18/2022)  Housing: Low Risk  (10/18/2022)  Transportation Needs: No Transportation Needs (10/18/2022)  Utilities: Not At Risk (10/18/2022)  Tobacco Use: Medium Risk (10/18/2022)     Readmission Risk Interventions     No data to display

## 2022-10-20 NOTE — Telephone Encounter (Signed)
Please schedule a new patient appointment with Herbert Phillips or Herbert Phillips in 2-3 weeks for thoracic fractures. Needs xrays that day. Thanks

## 2022-10-20 NOTE — [place] FL2 (Signed)
[place] MEDICAID FL2 LEVEL OF CARE FORM     IDENTIFICATION  Patient Name: Herbert Phillips Birthdate: Dec 08, 1925 Sex: male Admission Date (Current Location): 10/17/2022  [facility] and [place] Number:  Chiropodist and Address:  [facility], [address], [place], [place] [address]      Provider Number: [id number]  Attending Physician Name and Address:  Alford Highland, MD  Relative Name and Phone Number:       Current Level of Care: Hospital Recommended Level of Care: Skilled Nursing Facility Prior Approval Number:    Date Approved/Denied:   PASRR Number: [id number]  Discharge Plan: SNF    Current Diagnoses: Patient Active Problem List   Diagnosis Date Noted   Acute delirium 10/19/2022   History of gout 10/18/2022   Paroxysmal atrial fibrillation with RVR (HCC) 10/17/2022   Closed traumatic fracture of ribs of left side with pneumothorax 10/17/2022   Closed T12 fracture (HCC) 10/17/2022   Bradycardia 04/05/2018   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   Cancer of overlapping sites of bladder (HCC) 05/30/2016   Closed displaced subtrochanteric fracture of left femur (HCC) 02/04/2016   Dyslipidemia 09/14/2013   HTN (hypertension) 09/14/2013   Sleep apnea 09/14/2013   Type 2 diabetes mellitus with hyperlipidemia (HCC) 09/14/2013    Orientation RESPIRATION BLADDER Height & Weight     Self  Normal Continent Weight: 112 lb 3.4 oz (50.9 kg) Height:  6' (182.9 cm)  BEHAVIORAL SYMPTOMS/MOOD NEUROLOGICAL BOWEL NUTRITION STATUS   (None)  (None) Continent Diet (Heart healthy/carb modified)  AMBULATORY STATUS COMMUNICATION OF NEEDS Skin   Limited Assist Verbally Skin abrasions, Bruising                       Personal Care Assistance Level of Assistance              Functional Limitations Info  Sight, Hearing, Speech Sight Info: Adequate Hearing Info: Adequate Speech Info: Adequate     SPECIAL CARE FACTORS FREQUENCY  PT (By licensed PT)     PT Frequency: 5 x week              Contractures Contractures Info: Not present    Additional Factors Info  Code Status, Allergies Code Status Info: DNR Allergies Info: NKDA           Current Medications (10/20/2022):  This is the current hospital active medication list Current Facility-Administered Medications  Medication Dose Route Frequency Provider Last Rate Last Admin   acetaminophen (TYLENOL) tablet 650 mg  650 mg Oral Q6H PRN Gertha Calkin, MD       Or   acetaminophen (TYLENOL) suppository 650 mg  650 mg Rectal Q6H PRN Gertha Calkin, MD       allopurinol (ZYLOPRIM) tablet 100 mg  100 mg Oral Daily Alford Highland, MD   100 mg at 10/20/22 0957   Chlorhexidine Gluconate Cloth 2 % PADS 6 each  6 each Topical Daily Alford Highland, MD   6 each at 10/19/22 1427   [START ON 10/21/2022] diltiazem (CARDIZEM CD) 24 hr capsule 120 mg  120 mg Oral Daily Tang, Lily Michelle, PA-C       diltiazem (CARDIZEM) tablet 60 mg  60 mg Oral Q12H Tang, Cheryln Manly, PA-C       enoxaparin (LOVENOX) injection 40 mg  40 mg Subcutaneous Q24H Mila Merry A, RPH   40 mg at 10/19/22 2100   insulin aspart (  novoLOG) injection 0-9 Units  0-9 Units Subcutaneous TID WC Gertha Calkin, MD   2 Units at 10/19/22 1655   metoprolol tartrate (LOPRESSOR) tablet 50 mg  50 mg Oral BID Rebeca Allegra, PA-C   50 mg at 10/20/22 0957   ondansetron (ZOFRAN) tablet 4 mg  4 mg Oral Q6H PRN Gertha Calkin, MD       Or   ondansetron [facility]) injection 4 mg  4 mg Intravenous Q6H PRN Gertha Calkin, MD       pravastatin (PRAVACHOL) tablet 20 mg  20 mg Oral q1800 Alford Highland, MD   20 mg at 10/19/22 1747   QUEtiapine (SEROQUEL) tablet 12.5 mg  12.5 mg Oral QHS PRN Alford Highland, MD   12.5 mg at 10/19/22 2138   tamsulosin (FLOMAX) capsule 0.8 mg  0.8 mg Oral Daily Gertha Calkin, MD   0.8 mg at 10/20/22 1610     Discharge Medications: Please see  discharge summary for a list of discharge medications.  Relevant Imaging Results:  Relevant Lab Results:   Additional Information SS#: [id number]  Margarito Liner, LCSW

## 2022-10-20 NOTE — Progress Notes (Signed)
Physical Therapy Treatment Patient Details Name: Herbert Phillips MRN: [id number] DOB: 01/17/1926 Today's Date: 10/20/2022   History of Present Illness Emil Weigold is an 89 y.o. male seen in the emergency room brought by EMS from [facility] [place] complaints of shortness of breath.  Patient had a fall on Monday and during fall patient hit his head but did not have any loss of consciousness.  Since his fall he is becoming progressively weak short of breath. Admitted for further evaluation after being found to have rib fractures and small pneumothorax.  Patient denies palpitation tachycardia Baclospas or syncope; Patient was placed on Cardizem drip and heparin drip. 4/27 was switched from heparin to Eliquis; Continuing to monitor BP as managing HR; Ordered PT to assess for balance/recent fall.    PT Comments    Pt is making gradual progress towards goals with increased need for physical assist this date to maintain balance. Poor tolerance for standing with heavy post lean. RW used. Poor cognition noted. Would be +2 for mobility secondary to increased falls risk. Updated dispo recommendations, TOC updated.   Recommendations for follow up therapy are one component of a multi-disciplinary discharge planning process, led by the attending physician.  Recommendations may be updated based on patient status, additional functional criteria and insurance authorization.  Follow Up Recommendations  Can patient physically be transported by private vehicle: Yes    Assistance Recommended at Discharge Frequent or constant Supervision/Assistance  Patient can return home with the following A little help with walking and/or transfers;A little help with bathing/dressing/bathroom;Assistance with cooking/housework;Direct supervision/assist for medications management;Direct supervision/assist for financial management;Assist for transportation;Help with stairs or ramp for entrance   Equipment  Recommendations  None recommended by PT    Recommendations for Other Services       Precautions / Restrictions Precautions Precautions: Fall Restrictions Weight Bearing Restrictions: No     Mobility  Bed Mobility               General bed mobility comments: NT, received up in recliner    Transfers Overall transfer level: Needs assistance Equipment used: Rolling walker (2 wheels) Transfers: Sit to/from Stand Sit to Stand: Mod assist           General transfer comment: able to scoot out towards EOB, however despite cues for sequencing, unable to stand without significant assistance. RW used. Heavy post leaning once upright    Ambulation/Gait Ambulation/Gait assistance: Mod assist Gait Distance (Feet): 5 Feet Assistive device: Rolling walker (2 wheels) Gait Pattern/deviations: Decreased step length - right, Decreased step length - left, Trunk flexed, Shuffle       General Gait Details: cross over gait using RW. Very unsteady and self limits gait due to poor balance. HR remains in 80s-90s with exertion   Stairs             Wheelchair Mobility    Modified Rankin (Stroke Patients Only)       Balance Overall balance assessment: Needs assistance Sitting-balance support: Feet supported, No upper extremity supported Sitting balance-Leahy Scale: Good     Standing balance support: Bilateral upper extremity supported Standing balance-Leahy Scale: Zero                              Cognition Arousal/Alertness: Awake/alert Behavior During Therapy: WFL for tasks assessed/performed Overall Cognitive Status: Impaired/Different from baseline  General Comments: oriented to place, however confused to situation        Exercises Other Exercises Other Exercises: seated ther-ex performed on B LE including alt marching, SLRs, and AP. 10 reps Other Exercises: stood at chair with urinal and min  assist    General Comments        Pertinent Vitals/Pain Pain Assessment Pain Assessment: No/denies pain    Home Living                          Prior Function            PT Goals (current goals can now be found in the care plan section) Acute Rehab PT Goals Patient Stated Goal: to go home PT Goal Formulation: With patient Time For Goal Achievement: 11/01/22 Potential to Achieve Goals: Good Progress towards PT goals: Progressing toward goals    Frequency    Min 2X/week      PT Plan Discharge plan needs to be updated    Co-evaluation              AM-PAC PT "6 Clicks" Mobility   Outcome Measure  Help needed turning from your back to your side while in a flat bed without using bedrails?: A Little Help needed moving from lying on your back to sitting on the side of a flat bed without using bedrails?: A Little Help needed moving to and from a bed to a chair (including a wheelchair)?: A Lot Help needed standing up from a chair using your arms (e.g., wheelchair or bedside chair)?: A Lot Help needed to walk in hospital room?: A Lot Help needed climbing 3-5 steps with a railing? : Total 6 Click Score: 13    End of Session Equipment Utilized During Treatment: Gait belt Activity Tolerance: Patient tolerated treatment well Patient left: in chair;with chair alarm set Nurse Communication: Mobility status PT Visit Diagnosis: Unsteadiness on feet (R26.81);Other abnormalities of gait and mobility (R26.89);History of falling (Z91.81);Muscle weakness (generalized) (M62.81)     Time: 9147-8295 PT Time Calculation (min) (ACUTE ONLY): 19 min  Charges:  $Therapeutic Activity: 8-22 mins                     Elizabeth Palau, PT, DPT, GCS [phone]    Sandralee Tarkington 10/20/2022, 12:14 PM

## 2022-10-20 NOTE — Discharge Summary (Signed)
Physician Discharge Summary   Patient: Herbert Phillips MRN: [id number] DOB: August 14, 1925  Admit date:     10/17/2022  Discharge date: 10/20/22  Discharge Physician: Alford Highland   PCP: Lauro Regulus, MD   Recommendations at discharge:   Follow-up at the doctor at rehab 1 to 2 days Follow-up Dr.: Cardiology 1 week  Discharge Diagnoses: Principal Problem:   Acute delirium Active Problems:   Paroxysmal atrial fibrillation with RVR (HCC)   Closed traumatic fracture of ribs of left side with pneumothorax   Type 2 diabetes mellitus with hyperlipidemia (HCC)   HTN (hypertension)   Closed T12 fracture (HCC)   History of gout   Hospital Course: 89 year old man had a fall on Monday.  Complains of some shortness of breath.  Also having some weakness.  In the ER he is found to be in rapid atrial fibrillation.  CT scan of the chest showed a small pneumothorax and fractured ribs.  Patient was placed on Cardizem drip and heparin drip.  4/27.  Benefits and risks of blood thinner explained.  Switched heparin drip over to Eliquis.  Will speak more about whether or not to do blood thinner at home tomorrow.  Trying to taper off Cardizem drip by starting oral metoprolol and oral Cardizem. 4/28.  Patient with acute delirium and did not sleep last night.  Discussion with family and we will stop Eliquis.  Risk of fall and major bleed is very high in this patient.  They understand the risk of stroke is higher with those patients with atrial fibrillation. 4/29.  Patient interested in getting out of the hospital as soon as possible.  Still very weak.  Interested in going to rehab.  Patient started on Cardizem CD and metoprolol increased to 50 mg twice daily.  We stopped the blood thinner yesterday.  Risk of bleeding and falls to high in this patient.  Slept better last night.  Delirium is cleared.   Assessment and Plan: * Acute delirium Improved.  Can go back to his facility rehab  portion.  Paroxysmal atrial fibrillation with RVR (HCC) Patient on Cardizem CD and metoprolol increased to 50 mg twice a day.  No blood thinner.  Risk of stroke higher not being on blood thinner.  Family is okay with this risk because he is a high risk of falling and bleeding.    Closed traumatic fracture of ribs of left side with pneumothorax Rib fracture on the left side with a small pneumothorax less than 5% with anterior pneumomediastinum. Conservative management.  Patient not having any pain.  Small to moderate bilateral pleural effusions.  Small left pneumothorax and pneumomediastinum on repeat chest x-ray not seen.  Type 2 diabetes mellitus with hyperlipidemia (HCC) Can go back on metformin as outpatient.  Continue Mevacor.  HTN (hypertension) Blood pressure on the lower side.  Continue metoprolol and Cardizem.  Closed T12 fracture (HCC) No pain.  Conservative management.  Physical therapy evaluation appreciated.  History of gout On allopurinol         Consultants: cardiology Procedures performed: none  Disposition: Rehabilitation facility Diet recommendation:  Cardiac and Carb modified diet DISCHARGE MEDICATION: Allergies as of 10/20/2022   No Known Allergies      Medication List     STOP taking these medications    diphenhydrAMINE 25 MG tablet Commonly known as: BENADRYL   ramipril 2.5 MG capsule Commonly known as: ALTACE   torsemide 10 MG tablet Commonly known as: DEMADEX  TAKE these medications    acetaminophen 325 MG tablet Commonly known as: TYLENOL Take 650 mg by mouth every 4 (four) hours as needed for mild pain, moderate pain or fever.   allopurinol 100 MG tablet Commonly known as: ZYLOPRIM Take 100 mg by mouth daily.   Artificial Tears PF 0.1-0.3 % Soln Generic drug: Dextran 70-Hypromellose (PF) Place 1 drop into both eyes 2 (two) times daily.   aspirin EC 81 MG tablet Take 81 mg by mouth daily.   Cod Liver Oil 1000 MG  Caps Take 1 capsule by mouth daily.   cyanocobalamin 1000 MCG tablet Commonly known as: VITAMIN B12 Take 1,000 mcg by mouth daily.   diltiazem 120 MG 24 hr capsule Commonly known as: CARDIZEM CD Take 1 capsule (120 mg total) by mouth daily. Start taking on: October 21, 2022   docusate sodium 100 MG capsule Commonly known as: COLACE Take 1 capsule (100 mg total) by mouth 2 (two) times daily.   freestyle lancets USE TO TEST BLOOD SUGAR ONCE DAILY   FREESTYLE LITE test strip Generic drug: glucose blood USE 1 STRIP TO CHECK GLUCOSE TWICE DAILY   furosemide 20 MG tablet Commonly known as: LASIX Take 20 mg by mouth as needed.   Glucosamine Relief 1000 MG Tabs Generic drug: Glucosamine Sulfate Take 1,000 mg by mouth daily.   ipratropium 0.03 % nasal spray Commonly known as: ATROVENT Place 2 sprays into both nostrils 3 (three) times daily. Hold for overly dry nose.   lovastatin 20 MG tablet Commonly known as: MEVACOR Take 20 mg by mouth daily at 6 PM.   metFORMIN 500 MG tablet Commonly known as: GLUCOPHAGE Take 500 mg by mouth daily. Take with a meal.   metoprolol tartrate 50 MG tablet Commonly known as: LOPRESSOR Take 1 tablet (50 mg total) by mouth 2 (two) times daily.   Omega 3 1000 MG Caps Take 3,000 mg by mouth 2 (two) times daily.   PreserVision AREDS 2 Caps Take by mouth. Take one capsule by mouth twice daily for eye health.   Probiotic-10 Ultimate Caps Take 1 capsule by mouth daily.   Psyllium 400 MG Caps Take 1 capsule by mouth daily.   Refresh 1.4-0.6 % Soln Generic drug: Polyvinyl Alcohol-Povidone PF Place 1 drop into both eyes 2 (two) times daily as needed (dry eyes).   Systane Nighttime Oint Apply to both eyes at bedtime  as needed for eye irritation.   tamsulosin 0.4 MG Caps capsule Commonly known as: FLOMAX Take 0.8 mg by mouth daily.   VITAMIN D PO Take 1,000 Units by mouth daily.        Follow-up Information     Lauro Regulus, MD Follow up in 2 day(s).   Specialty: Internal Medicine Contact information: [address] [place] [facility] [place] [address] [phone]                Discharge Exam: Ceasar Mons Weights   10/18/22 0217 10/18/22 0801 10/20/22 0500  Weight: 67 kg 68.6 kg 50.9 kg   Physical Exam HENT:     Head: Normocephalic.     Mouth/Throat:     Pharynx: No oropharyngeal exudate.  Eyes:     General: Lids are normal.     Conjunctiva/sclera: Conjunctivae normal.  Cardiovascular:     Rate and Rhythm: Normal rate. Rhythm irregularly irregular.     Heart sounds: Normal heart sounds, S1 normal and S2 normal.  Pulmonary:     Breath  sounds: Examination of the right-lower field reveals decreased breath sounds. Examination of the left-lower field reveals decreased breath sounds. Decreased breath sounds present. No wheezing, rhonchi or rales.  Abdominal:     Palpations: Abdomen is soft.     Tenderness: There is no abdominal tenderness.  Musculoskeletal:     Right lower leg: No swelling.     Left lower leg: No swelling.  Skin:    General: Skin is warm.     Findings: No rash.  Neurological:     Mental Status: He is alert. He is disoriented.      Condition at discharge: stable  The results of significant diagnostics from this hospitalization (including imaging, microbiology, ancillary and laboratory) are listed below for reference.   Imaging Studies: DG Chest 2 View  Result Date: 10/19/2022 CLINICAL DATA:  Pneumothorax. Atrial fibrillation. EXAM: CHEST - 2 VIEW COMPARISON:  Radiograph and CT 10/17/2022 FINDINGS: The small left pneumothorax on prior CT is not seen by radiograph. The pneumomediastinum on prior CT is also not seen. Upper normal heart size. Stable mediastinal contours with aortic atherosclerosis. There are small to moderate bilateral pleural effusions, left greater than right. Slight increase in size of pleural effusions from prior exam. Grossly stable  compression deformities from recent imaging. IMPRESSION: 1. Small to moderate bilateral pleural effusions, left greater than right. Slight increase in size from prior exam. 2. The small left pneumothorax on prior CT is not seen by radiograph. The pneumomediastinum on prior CT is also not seen radiographically. Electronically Signed   By: Narda Rutherford M.D.   On: 10/19/2022 12:14   CT CHEST ABDOMEN PELVIS W CONTRAST  Result Date: 10/17/2022 CLINICAL DATA:  Fall 1 week ago, shortness of breath, bruising to back EXAM: CT CHEST, ABDOMEN, AND PELVIS WITH CONTRAST CT THORACIC SPINE WITH CONTRAST TECHNIQUE: Multidetector CT imaging of the chest, abdomen and pelvis was performed following the standard protocol during bolus administration of intravenous contrast. Multidetector CT imaging of the thoracic spine was performed following the standard protocol during bolus administration of intravenous contrast. RADIATION DOSE REDUCTION: This exam was performed according to the departmental dose-optimization program which includes automated exposure control, adjustment of the mA and/or kV according to patient size and/or use of iterative reconstruction technique. CONTRAST:  OMNIPAQUE IOHEXOL 300 MG/ML  SOLN COMPARISON:  CT abdomen pelvis, 06/08/2020 chest radiographs, 10/05/2014 FINDINGS: CT CHEST FINDINGS Cardiovascular: Aortic atherosclerosis. Normal cardiomegaly. Three-vessel coronary artery calcifications. No pericardial effusion. Mediastinum/Nodes: No enlarged mediastinal, hilar, or axillary lymph nodes. Small volume anterior pneumomediastinum (series 2, image 42). Thyroid gland, trachea, and esophagus demonstrate no significant findings. Lungs/Pleura: Moderate left, small right pleural effusions and associated atelectasis or consolidation. Mild centrilobular emphysema. Tiny, less than 5% left pneumothorax (series 4, image 34). Musculoskeletal: No chest wall mass or suspicious osseous lesions identified. Very  subtle, nondisplaced fractures of the posterior left third and fourth ribs, much better appreciated by comparison examination of the cervical spine (series 4, image 19). Subacute fractures of the anterior left seventh and eighth ribs, without acute displaced rib fracture (series 2, image 53, 59). CT ABDOMEN PELVIS FINDINGS Hepatobiliary: No solid liver abnormality is seen. No gallstones, gallbladder wall thickening, or biliary dilatation. Pancreas: Unremarkable. No pancreatic ductal dilatation or surrounding inflammatory changes. Spleen: Normal in size without significant abnormality. Adrenals/Urinary Tract: Adrenal glands are unremarkable. Kidneys are normal, without renal calculi, solid lesion, or hydronephrosis. Bladder is unremarkable. Stomach/Bowel: Stomach is within normal limits. Appendix is normal. No evidence of bowel wall thickening, distention,  or inflammatory changes. Vascular/Lymphatic: Aortic atherosclerosis. No enlarged abdominal or pelvic lymph nodes. Reproductive: No mass or other abnormality. Other: Large right inguinal hernia containing the cecal base, incompletely imaged (series 2, image 120) no ascites. Musculoskeletal: No acute osseous findings. CT THORACIC SPINE FINDINGS Alignment: Exaggerated thoracic kyphosis. Vertebral bodies: Acute superior endplate deformity of T12 (series 5, image 62). Unchanged severe anterior wedge deformity of T8 (series 5, image 60). Age indeterminate anterior wedge deformity of T2 (series 5, image 60). Disc spaces: Intact. Paraspinous soft tissues: Paraspinous soft tissue stranding about T12 without evidence of discrete fluid collection or epidural hematoma (series 1, image 36). IMPRESSION: 1. Tiny, less than 5% left pneumothorax. 2. Small volume anterior pneumomediastinum. 3. Moderate left, small right pleural effusions and associated atelectasis or consolidation. 4. Acute superior endplate deformity of T12. Unchanged severe anterior wedge deformity of T8. Age  indeterminate anterior wedge deformity of T2. 5. Paraspinous soft tissue stranding about T12 without evidence of discrete fluid collection or epidural hematoma. 6. Very subtle, nondisplaced fractures of the posterior left third and fourth ribs 7. Subacute fractures of the anterior left seventh and eighth ribs, without acute displaced rib fracture 8. No CT evidence of acute traumatic injury to the organs of the abdomen or pelvis. 9. Emphysema. 10. Coronary artery disease. These results were called by telephone at the time of interpretation on 10/17/2022 at 8:11 pm to Dr. Tresa Endo [facility] , who verbally acknowledged these results. Aortic Atherosclerosis (ICD10-I70.0) and Emphysema (ICD10-J43.9). Electronically Signed   By: Jearld Lesch M.D.   On: 10/17/2022 20:20   CT T-SPINE NO CHARGE  Result Date: 10/17/2022 CLINICAL DATA:  Fall 1 week ago, shortness of breath, bruising to back EXAM: CT CHEST, ABDOMEN, AND PELVIS WITH CONTRAST CT THORACIC SPINE WITH CONTRAST TECHNIQUE: Multidetector CT imaging of the chest, abdomen and pelvis was performed following the standard protocol during bolus administration of intravenous contrast. Multidetector CT imaging of the thoracic spine was performed following the standard protocol during bolus administration of intravenous contrast. RADIATION DOSE REDUCTION: This exam was performed according to the departmental dose-optimization program which includes automated exposure control, adjustment of the mA and/or kV according to patient size and/or use of iterative reconstruction technique. CONTRAST:  OMNIPAQUE IOHEXOL 300 MG/ML  SOLN COMPARISON:  CT abdomen pelvis, 06/08/2020 chest radiographs, 10/05/2014 FINDINGS: CT CHEST FINDINGS Cardiovascular: Aortic atherosclerosis. Normal cardiomegaly. Three-vessel coronary artery calcifications. No pericardial effusion. Mediastinum/Nodes: No enlarged mediastinal, hilar, or axillary lymph nodes. Small volume anterior pneumomediastinum (series  2, image 42). Thyroid gland, trachea, and esophagus demonstrate no significant findings. Lungs/Pleura: Moderate left, small right pleural effusions and associated atelectasis or consolidation. Mild centrilobular emphysema. Tiny, less than 5% left pneumothorax (series 4, image 34). Musculoskeletal: No chest wall mass or suspicious osseous lesions identified. Very subtle, nondisplaced fractures of the posterior left third and fourth ribs, much better appreciated by comparison examination of the cervical spine (series 4, image 19). Subacute fractures of the anterior left seventh and eighth ribs, without acute displaced rib fracture (series 2, image 53, 59). CT ABDOMEN PELVIS FINDINGS Hepatobiliary: No solid liver abnormality is seen. No gallstones, gallbladder wall thickening, or biliary dilatation. Pancreas: Unremarkable. No pancreatic ductal dilatation or surrounding inflammatory changes. Spleen: Normal in size without significant abnormality. Adrenals/Urinary Tract: Adrenal glands are unremarkable. Kidneys are normal, without renal calculi, solid lesion, or hydronephrosis. Bladder is unremarkable. Stomach/Bowel: Stomach is within normal limits. Appendix is normal. No evidence of bowel wall thickening, distention, or inflammatory changes. Vascular/Lymphatic: Aortic atherosclerosis. No enlarged  abdominal or pelvic lymph nodes. Reproductive: No mass or other abnormality. Other: Large right inguinal hernia containing the cecal base, incompletely imaged (series 2, image 120) no ascites. Musculoskeletal: No acute osseous findings. CT THORACIC SPINE FINDINGS Alignment: Exaggerated thoracic kyphosis. Vertebral bodies: Acute superior endplate deformity of T12 (series 5, image 62). Unchanged severe anterior wedge deformity of T8 (series 5, image 60). Age indeterminate anterior wedge deformity of T2 (series 5, image 60). Disc spaces: Intact. Paraspinous soft tissues: Paraspinous soft tissue stranding about T12 without evidence  of discrete fluid collection or epidural hematoma (series 1, image 36). IMPRESSION: 1. Tiny, less than 5% left pneumothorax. 2. Small volume anterior pneumomediastinum. 3. Moderate left, small right pleural effusions and associated atelectasis or consolidation. 4. Acute superior endplate deformity of T12. Unchanged severe anterior wedge deformity of T8. Age indeterminate anterior wedge deformity of T2. 5. Paraspinous soft tissue stranding about T12 without evidence of discrete fluid collection or epidural hematoma. 6. Very subtle, nondisplaced fractures of the posterior left third and fourth ribs 7. Subacute fractures of the anterior left seventh and eighth ribs, without acute displaced rib fracture 8. No CT evidence of acute traumatic injury to the organs of the abdomen or pelvis. 9. Emphysema. 10. Coronary artery disease. These results were called by telephone at the time of interpretation on 10/17/2022 at 8:11 pm to Dr. Tresa Endo [facility] , who verbally acknowledged these results. Aortic Atherosclerosis (ICD10-I70.0) and Emphysema (ICD10-J43.9). Electronically Signed   By: Jearld Lesch M.D.   On: 10/17/2022 20:20   CT HEAD WO CONTRAST ( )  Result Date: 10/17/2022 CLINICAL DATA:  Provided history: Head trauma, minor. Neck trauma. EXAM: CT HEAD WITHOUT CONTRAST CT CERVICAL SPINE WITHOUT CONTRAST TECHNIQUE: Multidetector CT imaging of the head and cervical spine was performed following the standard protocol without intravenous contrast. Multiplanar CT image reconstructions of the cervical spine were also generated. RADIATION DOSE REDUCTION: This exam was performed according to the departmental dose-optimization program which includes automated exposure control, adjustment of the mA and/or kV according to patient size and/or use of iterative reconstruction technique. COMPARISON:  Prior CT head and CT cervical spine 05/28/2020. FINDINGS: CT HEAD FINDINGS Brain: Moderate to advanced generalized cerebral atrophy. Patchy  and ill-defined hypoattenuation within the cerebral white matter, nonspecific but compatible with moderate chronic small vessel ischemic disease. 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 There is no acute intracranial hemorrhage. No demarcated cortical infarct. No extra-axial fluid collection. No evidence of an intracranial mass. No midline shift. Vascular: No hyperdense vessel. Atherosclerotic calcifications. Skull: No fracture or aggressive osseous lesion. Sinuses/Orbits: No mass or acute finding within the imaged orbits. Moderate mucosal thickening within the right sphenoid sinus. Mild mucosal thickening, and possible small fluid level, within the left sphenoid sinus. Mild mucosal thickening scattered within bilateral ethmoid air cells. CT CERVICAL SPINE FINDINGS Alignment: 2 mm C7-T1 grade 1 anterolisthesis. Skull base and vertebrae: The basion-dental and atlanto-dental intervals are maintained.No evidence of acute fracture to the cervical spine. Age-indeterminate T2 anterior wedge vertebral compression fracture (40% height loss). Soft tissues and spinal canal: No prevertebral fluid or swelling. No visible canal hematoma. Disc levels: Cervical spondylosis with multilevel disc space narrowing, disc bulges/central disc protrusions, uncovertebral hypertrophy and facet arthrosis. Disc space narrowing is advanced at C6-C7 and there is a posterior disc osteophyte complex at this level. No appreciable high-grade spinal canal stenosis. Multilevel bony neural foraminal narrowing. Upper chest: Small left pneumothorax. Partially imaged left pleural fluid collection measuring up to 92 Hounsfield units in density,  suspicious for hemothorax. Partially imaged right pleural effusion. Opacity within the posterior left upper lobe which may reflect atelectasis or pneumonia. Biapical pleuroparenchymal scarring. Other: Acute, nondisplaced fractures of the posterior left third, fourth and fifth  ribs. CT cervical spine impressions #2, #3, #4 and #5 called by telephone at the time of interpretation on 10/17/2022 at 5:03 pm to provider [facility] , who verbally acknowledged these results. IMPRESSION: CT head: 1.  No evidence of an acute intracranial abnormality. 2. Moderate chronic small vessel ischemic changes within the cerebral white matter. 3. 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 4. Moderate to advanced generalized cerebral atrophy. 5. Paranasal sinus disease, as described. CT cervical spine: 1. No evidence of acute fracture to the cervical spine. 2. Age-indeterminate T2 anterior wedge vertebral compression fracture. No bony retropulsion. 3. Small left pneumothorax. 4. Partly imaged left pleural fluid collection measuring up to 92 Hounsfield units in density, suspicious for hemothorax. A CT of the chest is recommended for further evaluation. 5. Acute, nondisplaced fractures of the posterior left third, fourth and fifth ribs. 6. Partially imaged right pleural effusion. 7. Opacity within the posterior left upper lobe, which may reflect atelectasis or pneumonia. Electronically Signed   By: Jackey Loge D.O.   On: 10/17/2022 17:07   CT Cervical Spine Wo Contrast  Result Date: 10/17/2022 CLINICAL DATA:  Provided history: Head trauma, minor. Neck trauma. EXAM: CT HEAD WITHOUT CONTRAST CT CERVICAL SPINE WITHOUT CONTRAST TECHNIQUE: Multidetector CT imaging of the head and cervical spine was performed following the standard protocol without intravenous contrast. Multiplanar CT image reconstructions of the cervical spine were also generated. RADIATION DOSE REDUCTION: This exam was performed according to the departmental dose-optimization program which includes automated exposure control, adjustment of the mA and/or kV according to patient size and/or use of iterative reconstruction technique. COMPARISON:  Prior CT head and CT cervical spine 05/28/2020. FINDINGS: CT HEAD  FINDINGS Brain: Moderate to advanced generalized cerebral atrophy. Patchy and ill-defined hypoattenuation within the cerebral white matter, nonspecific but compatible with moderate chronic small vessel ischemic disease. 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 There is no acute intracranial hemorrhage. No demarcated cortical infarct. No extra-axial fluid collection. No evidence of an intracranial mass. No midline shift. Vascular: No hyperdense vessel. Atherosclerotic calcifications. Skull: No fracture or aggressive osseous lesion. Sinuses/Orbits: No mass or acute finding within the imaged orbits. Moderate mucosal thickening within the right sphenoid sinus. Mild mucosal thickening, and possible small fluid level, within the left sphenoid sinus. Mild mucosal thickening scattered within bilateral ethmoid air cells. CT CERVICAL SPINE FINDINGS Alignment: 2 mm C7-T1 grade 1 anterolisthesis. Skull base and vertebrae: The basion-dental and atlanto-dental intervals are maintained.No evidence of acute fracture to the cervical spine. Age-indeterminate T2 anterior wedge vertebral compression fracture (40% height loss). Soft tissues and spinal canal: No prevertebral fluid or swelling. No visible canal hematoma. Disc levels: Cervical spondylosis with multilevel disc space narrowing, disc bulges/central disc protrusions, uncovertebral hypertrophy and facet arthrosis. Disc space narrowing is advanced at C6-C7 and there is a posterior disc osteophyte complex at this level. No appreciable high-grade spinal canal stenosis. Multilevel bony neural foraminal narrowing. Upper chest: Small left pneumothorax. Partially imaged left pleural fluid collection measuring up to 92 Hounsfield units in density, suspicious for hemothorax. Partially imaged right pleural effusion. Opacity within the posterior left upper lobe which may reflect atelectasis or pneumonia. Biapical pleuroparenchymal scarring. Other:  Acute, nondisplaced fractures of the posterior left third, fourth and fifth ribs. CT cervical spine  impressions #2, #3, #4 and #5 called by telephone at the time of interpretation on 10/17/2022 at 5:03 pm to provider [facility] , who verbally acknowledged these results. IMPRESSION: CT head: 1.  No evidence of an acute intracranial abnormality. 2. Moderate chronic small vessel ischemic changes within the cerebral white matter. 3. 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 4. Moderate to advanced generalized cerebral atrophy. 5. Paranasal sinus disease, as described. CT cervical spine: 1. No evidence of acute fracture to the cervical spine. 2. Age-indeterminate T2 anterior wedge vertebral compression fracture. No bony retropulsion. 3. Small left pneumothorax. 4. Partly imaged left pleural fluid collection measuring up to 92 Hounsfield units in density, suspicious for hemothorax. A CT of the chest is recommended for further evaluation. 5. Acute, nondisplaced fractures of the posterior left third, fourth and fifth ribs. 6. Partially imaged right pleural effusion. 7. Opacity within the posterior left upper lobe, which may reflect atelectasis or pneumonia. Electronically Signed   By: Jackey Loge D.O.   On: 10/17/2022 17:07   DG Chest 2 View  Result Date: 10/17/2022 CLINICAL DATA:  Shortness of breath, fell a week ago EXAM: CHEST - 2 VIEW COMPARISON:  02/04/2016, 08/01/2013 FINDINGS: Enlargement of cardiac silhouette. Mediastinal contours and pulmonary vascularity normal. Atherosclerotic calcification aorta. Bibasilar effusions and atelectasis. Upper lungs clear. No pneumothorax. Bones demineralized with progression of superior endplate compression deformity of a midthoracic vertebra and new vertebral plana of a lower thoracic vertebra since 2015. IMPRESSION: Bibasilar effusions and atelectasis. Enlargement of cardiac silhouette. Progressive compression fractures of mid and lower  thoracic vertebra since 2015. Electronically Signed   By: Ulyses Southward M.D.   On: 10/17/2022 14:39    Microbiology: Results for orders placed or performed during the hospital encounter of 10/17/22  MRSA Next Gen by PCR, Nasal     Status: None   Collection Time: 10/18/22  8:11 AM   Specimen: Nasal Mucosa; Nasal Swab  Result Value Ref Range Status   MRSA by PCR Next Gen NOT DETECTED NOT DETECTED Final    Comment: (NOTE) The GeneXpert MRSA Assay (FDA approved for NASAL specimens only), is one component of a comprehensive MRSA colonization surveillance program. It is not intended to diagnose MRSA infection nor to guide or monitor treatment for MRSA infections. Test performance is not FDA approved in patients less than 9 years old. Performed at [facility], [address] [place], [place] [address]     Labs: CBC: Recent Labs  Lab 10/17/22 1439 10/18/22 0620 10/19/22 0339  WBC 12.7* 10.6* 10.5  NEUTROABS  --  7.7  --   HGB 13.8 13.7 13.9  HCT 41.0 41.5 41.3  MCV 93.8 95.0 93.0  PLT 185 204 205   Basic Metabolic Panel: Recent Labs  Lab 10/17/22 1654 10/18/22 0620  NA 137 138  K 3.9 3.6  CL 104 104  CO2 22 24  GLUCOSE 109* 133*  BUN 41* 37*  CREATININE 1.07 1.09  CALCIUM 8.5* 8.3*  MG  --  2.2  PHOS  --  4.1   Liver Function Tests: Recent Labs  Lab 10/17/22 1654 10/18/22 0620  AST 20 15  ALT 17 16  ALKPHOS 64 71  BILITOT 0.7 1.0  PROT 6.6 6.6  ALBUMIN 3.0* 2.9*   CBG: Recent Labs  Lab 10/19/22 0741 10/19/22 1118 10/19/22 1556 10/19/22 2125 10/20/22 0715  GLUCAP 121* 164* 167* 152* 100*    Discharge time spent: greater than 30 minutes.  Signed: Alford Highland,  MD [facility] 10/20/2022

## 2022-10-20 NOTE — Progress Notes (Signed)
[facility] CLINIC CARDIOLOGY CONSULT NOTE       Patient ID: Herbert Phillips MRN: [id number] DOB/AGE: 87-Jun-1927 87 y.o.  Admit date: 10/17/2022 Referring Physician Dr. Irena Cords  Primary Physician Dr. Dareen Piano Primary Cardiologist Dr. Juliann Pares Reason for Consultation AF  HPI: Herbert Phillips is a [phone] with a PMH of DM2, OSA, HTN, HFpEF, mod AR (by echo 2021) who presented to [facility] ED 10/17/2022 after a mechanical fall with additional complaints of shortness of breath.  In the emergency department he was found to be in AF RVR, chest showed a small pneumothorax and multiple rib fractures.  He was initially treated with Cardizem and heparin infusions, with good control of his heart rate and was transition to oral Cardizem and metoprolol.  Shared decision making regarding anticoagulation following discharge was discussed in detail with the patient's daughter [facility]) and they decided against continuing Eliquis at discharge.  Interval history: -No acute events -Feels good today, visiting with his daughter this morning, asking about hospital discharge. -No chest pain or shortness of breath or heart racing - in AF on tele, rate controlled in the 90s-low 100s predominantly   Review of systems complete and found to be negative unless listed above     Past Medical History:  Diagnosis Date   Arthritis    B12 deficiency    Bladder cancer (HCC)    2 yrs ago   Diabetes (HCC) 09/14/2013   Dysrhythmia    Bradycardia   Hip fracture, left (HCC) 02/04/2016   Hypertension    Non-insulin dependent type 2 diabetes mellitus (HCC)    Sleep apnea     Past Surgical History:  Procedure Laterality Date   cyst on spine  1961   EAR CYST EXCISION     INTRAMEDULLARY (IM) NAIL INTERTROCHANTERIC Left 02/04/2016   Procedure: INTRAMEDULLARY (IM) NAIL INTERTROCHANTRIC;  Surgeon: Christena Flake, MD;  Location: [facility] ORS;  Service: Orthopedics;  Laterality: Left;   TRANSURETHRAL RESECTION OF BLADDER TUMOR N/A  10/24/2014   Procedure: TRANSURETHRAL RESECTION OF BLADDER TUMOR (TURBT);  Surgeon: Orson Ape, MD;  Location: [facility] ORS;  Service: Urology;  Laterality: N/A;    Medications Prior to Admission  Medication Sig Dispense Refill Last Dose   acetaminophen (TYLENOL) 325 MG tablet Take 650 mg by mouth every 4 (four) hours as needed for mild pain, moderate pain or fever.   10/17/2022 at 0651   allopurinol (ZYLOPRIM) 100 MG tablet Take 100 mg by mouth daily.  3 10/17/2022 at 1140   aspirin EC 81 MG tablet Take 81 mg by mouth daily.    10/17/2022 at 1140   Cholecalciferol (VITAMIN D PO) Take 1,000 Units by mouth daily.   10/17/2022   Cod Liver Oil 1000 MG CAPS Take 1 capsule by mouth daily.   10/17/2022 at 1140   Dextran 70-Hypromellose, PF, (ARTIFICIAL TEARS PF) 0.1-0.3 % SOLN Place 1 drop into both eyes 2 (two) times daily.   10/17/2022 at 1140   diphenhydrAMINE (BENADRYL) 25 MG tablet Take 50 mg by mouth at bedtime as needed for sleep.   10/16/2022 at 1831   docusate sodium (COLACE) 100 MG capsule Take 1 capsule (100 mg total) by mouth 2 (two) times daily. 10 capsule 0 10/17/2022 at 1140   furosemide (LASIX) 20 MG tablet Take 20 mg by mouth as needed.    prn at prn   Glucosamine Sulfate (GLUCOSAMINE RELIEF) 1000 MG TABS Take 1,000 mg by mouth daily.    10/17/2022 at 1140  ipratropium (ATROVENT) 0.03 % nasal spray Place 2 sprays into both nostrils 3 (three) times daily. Hold for overly dry nose.   10/17/2022 at 0651   lovastatin (MEVACOR) 20 MG tablet Take 20 mg by mouth daily at 6 PM.    10/16/2022 at 1830   metFORMIN (GLUCOPHAGE) 500 MG tablet Take 500 mg by mouth daily. Take with a meal.   10/17/2022 at 1140   Multiple Vitamins-Minerals (PRESERVISION AREDS 2) CAPS Take by mouth. Take one capsule by mouth twice daily for eye health.   10/17/2022 at 1140   Omega 3 1000 MG CAPS Take 3,000 mg by mouth 2 (two) times daily.   10/17/2022 at 1140   Polyvinyl Alcohol-Povidone PF (REFRESH) 1.4-0.6 % SOLN Place 1 drop into  both eyes 2 (two) times daily as needed (dry eyes).   prn at prn   Probiotic Product (PROBIOTIC-10 ULTIMATE) CAPS Take 1 capsule by mouth daily.   10/17/2022 at 1140   Psyllium 400 MG CAPS Take 1 capsule by mouth daily.   10/17/2022 at 1140   ramipril (ALTACE) 2.5 MG capsule Take 2.5 mg by mouth daily.   10/17/2022 at 1140   tamsulosin (FLOMAX) 0.4 MG CAPS capsule Take 0.8 mg by mouth daily.   10/17/2022 at 1140   torsemide (DEMADEX) 10 MG tablet Take 10 mg by mouth daily as needed.   prn at prn   vitamin B-12 (CYANOCOBALAMIN) 1000 MCG tablet Take 1,000 mcg by mouth daily.   10/17/2022 at 1140   White Petrolatum-Mineral Oil (SYSTANE NIGHTTIME) OINT Apply to both eyes at bedtime  as needed for eye irritation.   prn at prn   FREESTYLE LITE test strip USE 1 STRIP TO CHECK GLUCOSE TWICE DAILY  5    Lancets (FREESTYLE) lancets USE TO TEST BLOOD SUGAR ONCE DAILY      Social History   Socioeconomic History   Marital status: Widowed    Spouse name: Not on file   Number of children: Not on file   Years of education: Not on file   Highest education level: Not on file  Occupational History   Not on file  Tobacco Use   Smoking status: Former    Types: Cigarettes    Quit date: 06/13/1976    Years since quitting: 46.3   Smokeless tobacco: Never  Substance and Sexual Activity   Alcohol use: Yes    Comment: monthly 3 beers since xmas   Drug use: No   Sexual activity: Not on file  Other Topics Concern   Not on file  Social History Narrative   Not on file   Social Determinants of Health   Financial Resource Strain: Not on file  Food Insecurity: No Food Insecurity (10/18/2022)   Hunger Vital Sign    Worried About Running Out of Food in the Last Year: Never true    Ran Out of Food in the Last Year: Never true  Transportation Needs: No Transportation Needs (10/18/2022)   PRAPARE - Administrator, Civil Service (Medical): No    Lack of Transportation (Non-Medical): No  Physical  Activity: Not on file  Stress: Not on file  Social Connections: Not on file  Intimate Partner Violence: Not At Risk (10/18/2022)   Humiliation, Afraid, Rape, and Kick questionnaire    Fear of Current or Ex-Partner: No    Emotionally Abused: No    Physically Abused: No    Sexually Abused: No    Family History  Problem Relation Age of  Onset   Diabetes Neg Hx       Intake/Output Summary (Last 24 hours) at 10/20/2022 0829 Last data filed at 10/20/2022 0500 Gross per 24 hour  Intake 240 ml  Output 200 ml  Net 40 ml    Vitals:   10/20/22 0400 10/20/22 0500 10/20/22 0519 10/20/22 0610  BP: 111/80 (!) 120/48 104/76 125/68  Pulse: 89 (!) 107 63   Resp: 19 (!) 25 (!) 22   Temp: 97.8 F (36.6 C)     TempSrc: Oral     SpO2: (!) 89% (!) 87% 94%   Weight:  50.9 kg    Height:        PHYSICAL EXAM General: elderly caucasian male , in no acute distress.  Sitting upright in recliner with daughter at bedside. HEENT:  Normocephalic and atraumatic. Neck:  No JVD.  Lungs: Normal respiratory effort on room air.  Decreased breath sounds without appreciable crackles or wheezes. Heart: Irregularly irregular with controlled rate.. Normal S1 and S2 without gallops or murmurs.  Abdomen: Non-distended appearing.  Msk: Normal strength and tone for age. Extremities: Chronic appearing hyperpigmentation without peripheral edema. Neuro: Alert and oriented X 3. Psych:  Answers questions appropriately.   Labs: Basic Metabolic Panel: Recent Labs    10/17/22 1654 10/18/22 0620  NA 137 138  K 3.9 3.6  CL 104 104  CO2 22 24  GLUCOSE 109* 133*  BUN 41* 37*  CREATININE 1.07 1.09  CALCIUM 8.5* 8.3*  MG  --  2.2  PHOS  --  4.1   Liver Function Tests: Recent Labs    10/17/22 1654 10/18/22 0620  AST 20 15  ALT 17 16  ALKPHOS 64 71  BILITOT 0.7 1.0  PROT 6.6 6.6  ALBUMIN 3.0* 2.9*   No results for input(s): "LIPASE", "AMYLASE" in the last 72 hours. CBC: Recent Labs    10/18/22 0620  10/19/22 0339  WBC 10.6* 10.5  NEUTROABS 7.7  --   HGB 13.7 13.9  HCT 41.5 41.3  MCV 95.0 93.0  PLT 204 205   Cardiac Enzymes: Recent Labs    10/17/22 1654 10/17/22 1956  TROPONINIHS 13 11   BNP: Recent Labs    10/17/22 1654  BNP 498.5*   D-Dimer: No results for input(s): "DDIMER" in the last 72 hours. Hemoglobin A1C: Recent Labs    10/18/22 0620  HGBA1C 6.1*   Fasting Lipid Panel: No results for input(s): "CHOL", "HDL", "LDLCALC", "TRIG", "CHOLHDL", "LDLDIRECT" in the last 72 hours. Thyroid Function Tests: Recent Labs    10/18/22 0620  TSH 1.123   Anemia Panel: No results for input(s): "VITAMINB12", "FOLATE", "FERRITIN", "TIBC", "IRON", "RETICCTPCT" in the last 72 hours.   Radiology: DG Chest 2 View  Result Date: 10/19/2022 CLINICAL DATA:  Pneumothorax. Atrial fibrillation. EXAM: CHEST - 2 VIEW COMPARISON:  Radiograph and CT 10/17/2022 FINDINGS: The small left pneumothorax on prior CT is not seen by radiograph. The pneumomediastinum on prior CT is also not seen. Upper normal heart size. Stable mediastinal contours with aortic atherosclerosis. There are small to moderate bilateral pleural effusions, left greater than right. Slight increase in size of pleural effusions from prior exam. Grossly stable compression deformities from recent imaging. IMPRESSION: 1. Small to moderate bilateral pleural effusions, left greater than right. Slight increase in size from prior exam. 2. The small left pneumothorax on prior CT is not seen by radiograph. The pneumomediastinum on prior CT is also not seen radiographically. Electronically Signed   By: Narda Rutherford  M.D.   On: 10/19/2022 12:14   CT CHEST ABDOMEN PELVIS W CONTRAST  Result Date: 10/17/2022 CLINICAL DATA:  Fall 1 week ago, shortness of breath, bruising to back EXAM: CT CHEST, ABDOMEN, AND PELVIS WITH CONTRAST CT THORACIC SPINE WITH CONTRAST TECHNIQUE: Multidetector CT imaging of the chest, abdomen and pelvis was performed  following the standard protocol during bolus administration of intravenous contrast. Multidetector CT imaging of the thoracic spine was performed following the standard protocol during bolus administration of intravenous contrast. RADIATION DOSE REDUCTION: This exam was performed according to the departmental dose-optimization program which includes automated exposure control, adjustment of the mA and/or kV according to patient size and/or use of iterative reconstruction technique. CONTRAST:  OMNIPAQUE IOHEXOL 300 MG/ML  SOLN COMPARISON:  CT abdomen pelvis, 06/08/2020 chest radiographs, 10/05/2014 FINDINGS: CT CHEST FINDINGS Cardiovascular: Aortic atherosclerosis. Normal cardiomegaly. Three-vessel coronary artery calcifications. No pericardial effusion. Mediastinum/Nodes: No enlarged mediastinal, hilar, or axillary lymph nodes. Small volume anterior pneumomediastinum (series 2, image 42). Thyroid gland, trachea, and esophagus demonstrate no significant findings. Lungs/Pleura: Moderate left, small right pleural effusions and associated atelectasis or consolidation. Mild centrilobular emphysema. Tiny, less than 5% left pneumothorax (series 4, image 34). Musculoskeletal: No chest wall mass or suspicious osseous lesions identified. Very subtle, nondisplaced fractures of the posterior left third and fourth ribs, much better appreciated by comparison examination of the cervical spine (series 4, image 19). Subacute fractures of the anterior left seventh and eighth ribs, without acute displaced rib fracture (series 2, image 53, 59). CT ABDOMEN PELVIS FINDINGS Hepatobiliary: No solid liver abnormality is seen. No gallstones, gallbladder wall thickening, or biliary dilatation. Pancreas: Unremarkable. No pancreatic ductal dilatation or surrounding inflammatory changes. Spleen: Normal in size without significant abnormality. Adrenals/Urinary Tract: Adrenal glands are unremarkable. Kidneys are normal, without renal  calculi, solid lesion, or hydronephrosis. Bladder is unremarkable. Stomach/Bowel: Stomach is within normal limits. Appendix is normal. No evidence of bowel wall thickening, distention, or inflammatory changes. Vascular/Lymphatic: Aortic atherosclerosis. No enlarged abdominal or pelvic lymph nodes. Reproductive: No mass or other abnormality. Other: Large right inguinal hernia containing the cecal base, incompletely imaged (series 2, image 120) no ascites. Musculoskeletal: No acute osseous findings. CT THORACIC SPINE FINDINGS Alignment: Exaggerated thoracic kyphosis. Vertebral bodies: Acute superior endplate deformity of T12 (series 5, image 62). Unchanged severe anterior wedge deformity of T8 (series 5, image 60). Age indeterminate anterior wedge deformity of T2 (series 5, image 60). Disc spaces: Intact. Paraspinous soft tissues: Paraspinous soft tissue stranding about T12 without evidence of discrete fluid collection or epidural hematoma (series 1, image 36). IMPRESSION: 1. Tiny, less than 5% left pneumothorax. 2. Small volume anterior pneumomediastinum. 3. Moderate left, small right pleural effusions and associated atelectasis or consolidation. 4. Acute superior endplate deformity of T12. Unchanged severe anterior wedge deformity of T8. Age indeterminate anterior wedge deformity of T2. 5. Paraspinous soft tissue stranding about T12 without evidence of discrete fluid collection or epidural hematoma. 6. Very subtle, nondisplaced fractures of the posterior left third and fourth ribs 7. Subacute fractures of the anterior left seventh and eighth ribs, without acute displaced rib fracture 8. No CT evidence of acute traumatic injury to the organs of the abdomen or pelvis. 9. Emphysema. 10. Coronary artery disease. These results were called by telephone at the time of interpretation on 10/17/2022 at 8:11 pm to Dr. Tresa Endo [facility] , who verbally acknowledged these results. Aortic Atherosclerosis (ICD10-I70.0) and Emphysema  (ICD10-J43.9). Electronically Signed   By: Jearld Lesch M.D.   On: 10/17/2022  20:20   CT T-SPINE NO CHARGE  Result Date: 10/17/2022 CLINICAL DATA:  Fall 1 week ago, shortness of breath, bruising to back EXAM: CT CHEST, ABDOMEN, AND PELVIS WITH CONTRAST CT THORACIC SPINE WITH CONTRAST TECHNIQUE: Multidetector CT imaging of the chest, abdomen and pelvis was performed following the standard protocol during bolus administration of intravenous contrast. Multidetector CT imaging of the thoracic spine was performed following the standard protocol during bolus administration of intravenous contrast. RADIATION DOSE REDUCTION: This exam was performed according to the departmental dose-optimization program which includes automated exposure control, adjustment of the mA and/or kV according to patient size and/or use of iterative reconstruction technique. CONTRAST:  OMNIPAQUE IOHEXOL 300 MG/ML  SOLN COMPARISON:  CT abdomen pelvis, 06/08/2020 chest radiographs, 10/05/2014 FINDINGS: CT CHEST FINDINGS Cardiovascular: Aortic atherosclerosis. Normal cardiomegaly. Three-vessel coronary artery calcifications. No pericardial effusion. Mediastinum/Nodes: No enlarged mediastinal, hilar, or axillary lymph nodes. Small volume anterior pneumomediastinum (series 2, image 42). Thyroid gland, trachea, and esophagus demonstrate no significant findings. Lungs/Pleura: Moderate left, small right pleural effusions and associated atelectasis or consolidation. Mild centrilobular emphysema. Tiny, less than 5% left pneumothorax (series 4, image 34). Musculoskeletal: No chest wall mass or suspicious osseous lesions identified. Very subtle, nondisplaced fractures of the posterior left third and fourth ribs, much better appreciated by comparison examination of the cervical spine (series 4, image 19). Subacute fractures of the anterior left seventh and eighth ribs, without acute displaced rib fracture (series 2, image 53, 59). CT ABDOMEN PELVIS  FINDINGS Hepatobiliary: No solid liver abnormality is seen. No gallstones, gallbladder wall thickening, or biliary dilatation. Pancreas: Unremarkable. No pancreatic ductal dilatation or surrounding inflammatory changes. Spleen: Normal in size without significant abnormality. Adrenals/Urinary Tract: Adrenal glands are unremarkable. Kidneys are normal, without renal calculi, solid lesion, or hydronephrosis. Bladder is unremarkable. Stomach/Bowel: Stomach is within normal limits. Appendix is normal. No evidence of bowel wall thickening, distention, or inflammatory changes. Vascular/Lymphatic: Aortic atherosclerosis. No enlarged abdominal or pelvic lymph nodes. Reproductive: No mass or other abnormality. Other: Large right inguinal hernia containing the cecal base, incompletely imaged (series 2, image 120) no ascites. Musculoskeletal: No acute osseous findings. CT THORACIC SPINE FINDINGS Alignment: Exaggerated thoracic kyphosis. Vertebral bodies: Acute superior endplate deformity of T12 (series 5, image 62). Unchanged severe anterior wedge deformity of T8 (series 5, image 60). Age indeterminate anterior wedge deformity of T2 (series 5, image 60). Disc spaces: Intact. Paraspinous soft tissues: Paraspinous soft tissue stranding about T12 without evidence of discrete fluid collection or epidural hematoma (series 1, image 36). IMPRESSION: 1. Tiny, less than 5% left pneumothorax. 2. Small volume anterior pneumomediastinum. 3. Moderate left, small right pleural effusions and associated atelectasis or consolidation. 4. Acute superior endplate deformity of T12. Unchanged severe anterior wedge deformity of T8. Age indeterminate anterior wedge deformity of T2. 5. Paraspinous soft tissue stranding about T12 without evidence of discrete fluid collection or epidural hematoma. 6. Very subtle, nondisplaced fractures of the posterior left third and fourth ribs 7. Subacute fractures of the anterior left seventh and eighth ribs, without  acute displaced rib fracture 8. No CT evidence of acute traumatic injury to the organs of the abdomen or pelvis. 9. Emphysema. 10. Coronary artery disease. These results were called by telephone at the time of interpretation on 10/17/2022 at 8:11 pm to Dr. Tresa Endo [facility] , who verbally acknowledged these results. Aortic Atherosclerosis (ICD10-I70.0) and Emphysema (ICD10-J43.9). Electronically Signed   By: Jearld Lesch M.D.   On: 10/17/2022 20:20   CT HEAD WO CONTRAST ( )  Result Date: 10/17/2022 CLINICAL DATA:  Provided history: Head trauma, minor. Neck trauma. EXAM: CT HEAD WITHOUT CONTRAST CT CERVICAL SPINE WITHOUT CONTRAST TECHNIQUE: Multidetector CT imaging of the head and cervical spine was performed following the standard protocol without intravenous contrast. Multiplanar CT image reconstructions of the cervical spine were also generated. RADIATION DOSE REDUCTION: This exam was performed according to the departmental dose-optimization program which includes automated exposure control, adjustment of the mA and/or kV according to patient size and/or use of iterative reconstruction technique. COMPARISON:  Prior CT head and CT cervical spine 05/28/2020. FINDINGS: CT HEAD FINDINGS Brain: Moderate to advanced generalized cerebral atrophy. Patchy and ill-defined hypoattenuation within the cerebral white matter, nonspecific but compatible with moderate chronic small vessel ischemic disease. 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 There is no acute intracranial hemorrhage. No demarcated cortical infarct. No extra-axial fluid collection. No evidence of an intracranial mass. No midline shift. Vascular: No hyperdense vessel. Atherosclerotic calcifications. Skull: No fracture or aggressive osseous lesion. Sinuses/Orbits: No mass or acute finding within the imaged orbits. Moderate mucosal thickening within the right sphenoid sinus. Mild mucosal thickening, and possible small fluid  level, within the left sphenoid sinus. Mild mucosal thickening scattered within bilateral ethmoid air cells. CT CERVICAL SPINE FINDINGS Alignment: 2 mm C7-T1 grade 1 anterolisthesis. Skull base and vertebrae: The basion-dental and atlanto-dental intervals are maintained.No evidence of acute fracture to the cervical spine. Age-indeterminate T2 anterior wedge vertebral compression fracture (40% height loss). Soft tissues and spinal canal: No prevertebral fluid or swelling. No visible canal hematoma. Disc levels: Cervical spondylosis with multilevel disc space narrowing, disc bulges/central disc protrusions, uncovertebral hypertrophy and facet arthrosis. Disc space narrowing is advanced at C6-C7 and there is a posterior disc osteophyte complex at this level. No appreciable high-grade spinal canal stenosis. Multilevel bony neural foraminal narrowing. Upper chest: Small left pneumothorax. Partially imaged left pleural fluid collection measuring up to 92 Hounsfield units in density, suspicious for hemothorax. Partially imaged right pleural effusion. Opacity within the posterior left upper lobe which may reflect atelectasis or pneumonia. Biapical pleuroparenchymal scarring. Other: Acute, nondisplaced fractures of the posterior left third, fourth and fifth ribs. CT cervical spine impressions #2, #3, #4 and #5 called by telephone at the time of interpretation on 10/17/2022 at 5:03 pm to provider [facility] , who verbally acknowledged these results. IMPRESSION: CT head: 1.  No evidence of an acute intracranial abnormality. 2. Moderate chronic small vessel ischemic changes within the cerebral white matter. 3. 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 4. Moderate to advanced generalized cerebral atrophy. 5. Paranasal sinus disease, as described. CT cervical spine: 1. No evidence of acute fracture to the cervical spine. 2. Age-indeterminate T2 anterior wedge vertebral compression fracture. No  bony retropulsion. 3. Small left pneumothorax. 4. Partly imaged left pleural fluid collection measuring up to 92 Hounsfield units in density, suspicious for hemothorax. A CT of the chest is recommended for further evaluation. 5. Acute, nondisplaced fractures of the posterior left third, fourth and fifth ribs. 6. Partially imaged right pleural effusion. 7. Opacity within the posterior left upper lobe, which may reflect atelectasis or pneumonia. Electronically Signed   By: Jackey Loge D.O.   On: 10/17/2022 17:07   CT Cervical Spine Wo Contrast  Result Date: 10/17/2022 CLINICAL DATA:  Provided history: Head trauma, minor. Neck trauma. EXAM: CT HEAD WITHOUT CONTRAST CT CERVICAL SPINE WITHOUT CONTRAST TECHNIQUE: Multidetector CT imaging of the head and cervical spine was performed following the standard protocol without intravenous  contrast. Multiplanar CT image reconstructions of the cervical spine were also generated. RADIATION DOSE REDUCTION: This exam was performed according to the departmental dose-optimization program which includes automated exposure control, adjustment of the mA and/or kV according to patient size and/or use of iterative reconstruction technique. COMPARISON:  Prior CT head and CT cervical spine 05/28/2020. FINDINGS: CT HEAD FINDINGS Brain: Moderate to advanced generalized cerebral atrophy. Patchy and ill-defined hypoattenuation within the cerebral white matter, nonspecific but compatible with moderate chronic small vessel ischemic disease. 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 There is no acute intracranial hemorrhage. No demarcated cortical infarct. No extra-axial fluid collection. No evidence of an intracranial mass. No midline shift. Vascular: No hyperdense vessel. Atherosclerotic calcifications. Skull: No fracture or aggressive osseous lesion. Sinuses/Orbits: No mass or acute finding within the imaged orbits. Moderate mucosal thickening within the  right sphenoid sinus. Mild mucosal thickening, and possible small fluid level, within the left sphenoid sinus. Mild mucosal thickening scattered within bilateral ethmoid air cells. CT CERVICAL SPINE FINDINGS Alignment: 2 mm C7-T1 grade 1 anterolisthesis. Skull base and vertebrae: The basion-dental and atlanto-dental intervals are maintained.No evidence of acute fracture to the cervical spine. Age-indeterminate T2 anterior wedge vertebral compression fracture (40% height loss). Soft tissues and spinal canal: No prevertebral fluid or swelling. No visible canal hematoma. Disc levels: Cervical spondylosis with multilevel disc space narrowing, disc bulges/central disc protrusions, uncovertebral hypertrophy and facet arthrosis. Disc space narrowing is advanced at C6-C7 and there is a posterior disc osteophyte complex at this level. No appreciable high-grade spinal canal stenosis. Multilevel bony neural foraminal narrowing. Upper chest: Small left pneumothorax. Partially imaged left pleural fluid collection measuring up to 92 Hounsfield units in density, suspicious for hemothorax. Partially imaged right pleural effusion. Opacity within the posterior left upper lobe which may reflect atelectasis or pneumonia. Biapical pleuroparenchymal scarring. Other: Acute, nondisplaced fractures of the posterior left third, fourth and fifth ribs. CT cervical spine impressions #2, #3, #4 and #5 called by telephone at the time of interpretation on 10/17/2022 at 5:03 pm to provider [facility] , who verbally acknowledged these results. IMPRESSION: CT head: 1.  No evidence of an acute intracranial abnormality. 2. Moderate chronic small vessel ischemic changes within the cerebral white matter. 3. 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 4. Moderate to advanced generalized cerebral atrophy. 5. Paranasal sinus disease, as described. CT cervical spine: 1. No evidence of acute fracture to the cervical spine. 2.  Age-indeterminate T2 anterior wedge vertebral compression fracture. No bony retropulsion. 3. Small left pneumothorax. 4. Partly imaged left pleural fluid collection measuring up to 92 Hounsfield units in density, suspicious for hemothorax. A CT of the chest is recommended for further evaluation. 5. Acute, nondisplaced fractures of the posterior left third, fourth and fifth ribs. 6. Partially imaged right pleural effusion. 7. Opacity within the posterior left upper lobe, which may reflect atelectasis or pneumonia. Electronically Signed   By: Jackey Loge D.O.   On: 10/17/2022 17:07   DG Chest 2 View  Result Date: 10/17/2022 CLINICAL DATA:  Shortness of breath, fell a week ago EXAM: CHEST - 2 VIEW COMPARISON:  02/04/2016, 08/01/2013 FINDINGS: Enlargement of cardiac silhouette. Mediastinal contours and pulmonary vascularity normal. Atherosclerotic calcification aorta. Bibasilar effusions and atelectasis. Upper lungs clear. No pneumothorax. Bones demineralized with progression of superior endplate compression deformity of a midthoracic vertebra and new vertebral plana of a lower thoracic vertebra since 2015. IMPRESSION: Bibasilar effusions and atelectasis. Enlargement of cardiac silhouette. Progressive compression fractures of mid  and lower thoracic vertebra since 2015. Electronically Signed   By: Ulyses Southward M.D.   On: 10/17/2022 14:39    ECHO 02/13/2020 NORMAL LEFT VENTRICULAR SYSTOLIC FUNCTION  NORMAL RIGHT VENTRICULAR SYSTOLIC FUNCTION  MODERATE VALVULAR REGURGITATION (See above)  NO VALVULAR STENOSIS  MODERATE AR  MILD TR, MR, PR  EF 50-55%  Closest EF: >55% (Estimated)  Mitral: MILD MR  Tricuspid: MILD TR  Aortic: MODERATE AR  Sclerotic non stenotic aortic valve  _________________________________________________________________________________________  Electronically signed by            MD Mariel Kansky on 02/14/2020 08: 44 AM           Performed By: Merla Riches, RDCS     Ordering Physician:  Harle Battiest reviewed by me (LT) 10/20/2022 : AF rate 90s - 110s  EKG reviewed by me: AF PVC rate 100   Data reviewed by me (LT) 10/20/2022: hospitalist progress note, last outpatinnt cardiology ntoe    Principal Problem:   Acute delirium Active Problems:   HTN (hypertension)   Type 2 diabetes mellitus with hyperlipidemia (HCC)   Paroxysmal atrial fibrillation with RVR (HCC)   Closed traumatic fracture of ribs of left side with pneumothorax   Closed T12 fracture (HCC)   History of gout    ASSESSMENT AND PLAN:  Herbert Phillips is a 89yoM with a PMH of DM2, OSA, HTN, HFpEF, mod AR (by echo 2021) who presented to [facility] ED 10/17/2022 after a mechanical fall with additional complaints of shortness of breath.  In the emergency department he was found to be in AF RVR, chest showed a small pneumothorax and multiple rib fractures.  He was initially treated with Cardizem and heparin infusions, with good control of his heart rate and was transition to oral Cardizem and metoprolol.  Shared decision making regarding anticoagulation following discharge was discussed in detail with the patient's daughter [facility]) and they decided against continuing Eliquis at discharge.  # Recurrent falls # Paroxysmal atrial fibrillation with RVR -Agree with current therapy per primary team -Consolidate Cardizem 30 mg p.o. every 6 hours to twice daily dosing for today, then CD dosing 120 mg daily tomorrow morning -Consolidate metoprolol tartrate to 25 mg twice a day -The patient and his daughter (POA) continued to decline therapeutic anticoagulation with Eliquis with the patient's recurrent falls.  They understand that he is at an increased risk for CVA.  CHA2DS2-VASc 4 (age, HTN, DM2)  -Follow-up with Dr. Juliann Pares 1-2 weeks after discharge.  This patient's plan of care was discussed and created with Dr. Darrold Junker and he is in agreement.  Signed: Rebeca Allegra , PA-C 10/20/2022, 8:29 AM [facility]

## 2022-10-20 NOTE — Telephone Encounter (Signed)
Dr Madaline Brilliant saw this patient as a consult on 10/19/22. Per Dr Madaline Brilliant:  "64M with past medical history of hypertension, bladder cancer in remission, sleep apnea, type 2 diabetes who presents with shortness of breath.  No back pain, scanned CT  Chest for dyspnea found multiple severe prior wedge fractures of thoracic spine including? new T12 endplate fracture.  Neuro intact, baseline thoracic kyphosis. PLAN: Was admitted to medicine for SOB/hypoxia workup. Brace only as needed to mobilize given risk to lung constriction with TLSO and his baseline kyphosis signed off, remains inpatient needs outpatient f/u"

## 2022-11-20 ENCOUNTER — Ambulatory Visit: Payer: Medicare Other | Admitting: Neurosurgery

## 2022-12-03 ENCOUNTER — Other Ambulatory Visit: Payer: Self-pay

## 2022-12-03 DIAGNOSIS — S22089S Unspecified fracture of T11-T12 vertebra, sequela: Secondary | ICD-10-CM

## 2022-12-04 ENCOUNTER — Ambulatory Visit: Payer: Medicare Other | Admitting: Neurosurgery

## 2022-12-09 ENCOUNTER — Ambulatory Visit: Payer: Medicare Other | Admitting: Neurosurgery

## 2022-12-19 ENCOUNTER — Other Ambulatory Visit: Payer: Self-pay | Admitting: Orthopedic Surgery

## 2022-12-19 DIAGNOSIS — S22089S Unspecified fracture of T11-T12 vertebra, sequela: Secondary | ICD-10-CM

## 2022-12-19 NOTE — Progress Notes (Signed)
Referring Physician:  Lauro Regulus, MD [address] [facility] [place],  [place] [address]  Primary Physician:  Lauro Regulus, MD  History of Present Illness: 12/23/2022 Herbert Phillips has a history of HTN, afib, OSA, DM, bladder CA, and dyslipidemia.   Was seen by Dr. Madaline Brilliant as hospital consult on 10/19/22. Did not have any back pain, but CT of chest done for dyspnea showed multiple severe prior wedge fractures with possible new T12 endplate fracture. He was to wear TLSO only as needed due to risk to lung constriction with TLSO and baseline kyphosis.   He is here for outpatient follow up.   He is at [facility] at [place] in [place]. No paperwork sent.   Daughter helps with history as needed.   He has no back pain. No arm or leg pain. No numbness, tingling, or weakness in his arms/legs. He did not get TLSO in the hospital. He is doing PT 3 times a week at SNF. He is able to ambulate short distances with walker when he is with PT.    Review of Systems:  A 10 point review of systems is negative, except for the pertinent positives and negatives detailed in the HPI.  Past Medical History: Past Medical History:  Diagnosis Date   Arthritis    B12 deficiency    Bladder cancer (HCC)    2 yrs ago   Diabetes (HCC) 09/14/2013   Dysrhythmia    Bradycardia   Hip fracture, left (HCC) 02/04/2016   Hypertension    Non-insulin dependent type 2 diabetes mellitus (HCC)    Sleep apnea     Past Surgical History: Past Surgical History:  Procedure Laterality Date   cyst on spine  1961   EAR CYST EXCISION     INTRAMEDULLARY (IM) NAIL INTERTROCHANTERIC Left 02/04/2016   Procedure: INTRAMEDULLARY (IM) NAIL INTERTROCHANTRIC;  Surgeon: Christena Flake, MD;  Location: [facility] ORS;  Service: Orthopedics;  Laterality: Left;   TRANSURETHRAL RESECTION OF BLADDER TUMOR N/A 10/24/2014   Procedure: TRANSURETHRAL RESECTION OF BLADDER TUMOR (TURBT);  Surgeon: Orson Ape, MD;   Location: [facility] ORS;  Service: Urology;  Laterality: N/A;    Allergies: Allergies as of 12/23/2022   (No Known Allergies)    Medications: Outpatient Encounter Medications as of 12/23/2022  Medication Sig   acetaminophen (TYLENOL) 325 MG tablet Take 650 mg by mouth every 4 (four) hours as needed for mild pain, moderate pain or fever.   allopurinol (ZYLOPRIM) 100 MG tablet Take 100 mg by mouth daily.   aspirin EC 81 MG tablet Take 81 mg by mouth daily.    Cholecalciferol (VITAMIN D PO) Take 1,000 Units by mouth daily.   Cod Liver Oil 1000 MG CAPS Take 1 capsule by mouth daily.   Dextran 70-Hypromellose, PF, (ARTIFICIAL TEARS PF) 0.1-0.3 % SOLN Place 1 drop into both eyes 2 (two) times daily.   diltiazem (CARDIZEM CD) 120 MG 24 hr capsule Take 1 capsule (120 mg total) by mouth daily.   docusate sodium (COLACE) 100 MG capsule Take 1 capsule (100 mg total) by mouth 2 (two) times daily.   FREESTYLE LITE test strip USE 1 STRIP TO CHECK GLUCOSE TWICE DAILY   furosemide (LASIX) 20 MG tablet Take 20 mg by mouth as needed.    Glucosamine Sulfate (GLUCOSAMINE RELIEF) 1000 MG TABS Take 1,000 mg by mouth daily.    ipratropium (ATROVENT) 0.03 % nasal spray Place 2 sprays into both nostrils 3 (three)  times daily. Hold for overly dry nose.   Lancets (FREESTYLE) lancets USE TO TEST BLOOD SUGAR ONCE DAILY   lovastatin (MEVACOR) 20 MG tablet Take 20 mg by mouth daily at 6 PM.    metFORMIN (GLUCOPHAGE) 500 MG tablet Take 500 mg by mouth daily. Take with a meal.   metoprolol tartrate (LOPRESSOR) 50 MG tablet Take 1 tablet (50 mg total) by mouth 2 (two) times daily.   Multiple Vitamins-Minerals (PRESERVISION AREDS 2) CAPS Take by mouth. Take one capsule by mouth twice daily for eye health.   Omega 3 1000 MG CAPS Take 3,000 mg by mouth 2 (two) times daily.   Polyvinyl Alcohol-Povidone PF (REFRESH) 1.4-0.6 % SOLN Place 1 drop into both eyes 2 (two) times daily as needed (dry eyes).   Probiotic Product  (PROBIOTIC-10 ULTIMATE) CAPS Take 1 capsule by mouth daily.   Psyllium 400 MG CAPS Take 1 capsule by mouth daily.   tamsulosin (FLOMAX) 0.4 MG CAPS capsule Take 0.8 mg by mouth daily.   vitamin B-12 (CYANOCOBALAMIN) 1000 MCG tablet Take 1,000 mcg by mouth daily.   White Petrolatum-Mineral Oil (SYSTANE NIGHTTIME) OINT Apply to both eyes at bedtime  as needed for eye irritation.   No facility-administered encounter medications on file as of 12/23/2022.    Social History: Social History   Tobacco Use   Smoking status: Former    Types: Cigarettes    Quit date: 06/13/1976    Years since quitting: 46.5   Smokeless tobacco: Never  Substance Use Topics   Alcohol use: Yes    Comment: monthly 3 beers since xmas   Drug use: No    Family Medical History: Family History  Problem Relation Age of Onset   Diabetes Neg Hx     Physical Examination: There were no vitals filed for this visit.  General: Patient is well developed, well nourished, calm, collected, and in no apparent distress. Attention to examination is appropriate.  Respiratory: Patient is breathing without any difficulty.   NEUROLOGICAL:     Awake, alert, oriented to person, place, and time.  Speech is clear and fluent. Fund of knowledge is appropriate.   Cranial Nerves: Pupils equal round and reactive to light.  Facial tone is symmetric.    He has significant thoracic kyphosis.   No thoracic tenderness noted.   Strength: Side Biceps Triceps Deltoid Interossei Grip Wrist Ext. Wrist Flex.  R 5 5 5 5 5 5 5   L 5 5 5 5 5 5 5    Side Iliopsoas Quads Hamstring PF DF EHL  R 5 5 5 5 5 5   L 5 5 5 5 5 5    Bilateral upper and lower extremity sensation is intact to light touch.     Gait not tested. He is in a WC.   Medical Decision Making  Imaging: Thoracic xrays dated 12/23/22:  Severe compression of T8 that is unchanged. T12 looks stable. Not able to visualize T2 well.   Radiology report of above xrays is not available  yet.   Assessment and Plan: Herbert Phillips is a pleasant 89 y.o. male has no current back pain. No arm or leg pain. No numbness, tingling, or weakness.   He has significant thoracis kyphosis (not new) and was found to have thoracic fractures on previous imaging in April. T8 and T2 were felt to be old with new compression at T12.   Xrays from today show T8 is unchaged. T12 appears stable in comparison to previous thoracic CT.  He never received TLSO brace and I don't think he would tolerate it.   Treatment options discussed with patient and following plan made:   - Okay to continue with current PT.  - No bending, twisting, or lifting.  - With his increased kyphosis, would avoid any high risk activity. Discussed with patient and his daughter.  - He will follow up prn.   Reviewed above with Dr. Myer Haff and he agrees.   I spent a total of 25 minutes in face-to-face and non-face-to-face activities related to this patient's care today including review of outside records, review of imaging, review of symptoms, physical exam, discussion of differential diagnosis, discussion of treatment options, and documentation.   Thank you for involving me in the care of this patient.   Drake Leach PA-C Dept. of Neurosurgery

## 2022-12-23 ENCOUNTER — Encounter: Payer: Self-pay | Admitting: Orthopedic Surgery

## 2022-12-23 ENCOUNTER — Ambulatory Visit (INDEPENDENT_AMBULATORY_CARE_PROVIDER_SITE_OTHER): Payer: Medicare Other | Admitting: Orthopedic Surgery

## 2022-12-23 ENCOUNTER — Ambulatory Visit
Admission: RE | Admit: 2022-12-23 | Discharge: 2022-12-23 | Disposition: A | Discharge status: Home / Self Care | Payer: Medicare Other | Attending: Neurosurgery | Admitting: Neurosurgery

## 2022-12-23 ENCOUNTER — Ambulatory Visit
Admission: RE | Admit: 2022-12-23 | Discharge: 2022-12-23 | Disposition: A | Discharge status: Home / Self Care | Payer: Medicare Other | Source: Ambulatory Visit | Attending: Neurosurgery | Admitting: Neurosurgery

## 2022-12-23 VITALS — BP 114/52 | HR 52

## 2022-12-23 DIAGNOSIS — M40204 Unspecified kyphosis, thoracic region: Secondary | ICD-10-CM

## 2022-12-23 DIAGNOSIS — S22089S Unspecified fracture of T11-T12 vertebra, sequela: Secondary | ICD-10-CM

## 2022-12-23 DIAGNOSIS — S22089A Unspecified fracture of T11-T12 vertebra, initial encounter for closed fracture: Secondary | ICD-10-CM | POA: Diagnosis not present

## 2023-02-10 ENCOUNTER — Ambulatory Visit: Payer: Medicare Other | Admitting: Urology

## 2023-03-04 ENCOUNTER — Ambulatory Visit (INDEPENDENT_AMBULATORY_CARE_PROVIDER_SITE_OTHER): Payer: Medicare Other | Admitting: Urology

## 2023-03-04 ENCOUNTER — Encounter: Payer: Self-pay | Admitting: Urology

## 2023-03-04 VITALS — BP 91/55 | HR 55 | Wt 144.0 lb

## 2023-03-04 DIAGNOSIS — Z87440 Personal history of urinary (tract) infections: Secondary | ICD-10-CM | POA: Diagnosis not present

## 2023-03-04 DIAGNOSIS — R339 Retention of urine, unspecified: Secondary | ICD-10-CM

## 2023-03-04 DIAGNOSIS — N401 Enlarged prostate with lower urinary tract symptoms: Secondary | ICD-10-CM

## 2023-03-04 DIAGNOSIS — R351 Nocturia: Secondary | ICD-10-CM

## 2023-03-04 DIAGNOSIS — Z8551 Personal history of malignant neoplasm of bladder: Secondary | ICD-10-CM | POA: Diagnosis not present

## 2023-03-04 LAB — BLADDER SCAN AMB NON-IMAGING: PVR: 477 WU

## 2023-03-04 NOTE — Progress Notes (Signed)
Marcelle Overlie Plume,acting as a scribe for Vanna Scotland, MD.,have documented all relevant documentation on the behalf of Vanna Scotland, MD,as directed by  Vanna Scotland, MD while in the presence of Vanna Scotland, MD.  03/04/2023 12:01 PM   Herbert Phillips [birth year] [id number]  Referring provider: Lauro Regulus, MD [address] [facility] [place],  [place] [address]  Chief Complaint  Patient presents with   Establish Care   Benign Prostatic Hypertrophy    HPI: 89 year-old male who presents today to establish care. He was formerly a patient of Dr. Evelene Croon. He has multiple medical comorbidities.   He was not able to give a urine sample today. His PVR showed 477. Unable to void today for [place].    It appears that he has a personal history of bladder cancer. An operative note from 2016 indicated that he underwent TURBT with instillation of intravascular gemcitabine. The tumor was described as being 2 cm in the right lateral bladder wall, just lateral to the UO.  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 The muscularis, propria, and lamina propria were involved. He was under the care of Dr. Donneta Romberg at the cancer center. He completed cis-platinum therapy along with radiation in 01/2015. Per Dr. Sharlette Dense last note, which was in 2021, his last cystoscopy was in 2018. His most recent cross-sectional imaging was on 10/17/2022 in the form of CT chest, abdomen, and pelvis done at the time of a a fall and delirium when he was admitted. On the scan, he had normal kidneys, the bladder was unremarkable, no evidence of lymphadenopathy or metastatic disease. He has a very large right inguinal hernia.   He was actually referred her in 09/2022 by his primary care for a question of BPH and urinary frequency. He does not have PSA results available.   He also has a personal history of urinary retention dating back to  records from 2016.   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   At one point, he was myrbetriq and Flomax.   The last documented note from Dr. Evelene Croon was in 2022. At that time, he was having significant urinary symptoms, including frequency, urgency, nocturia x5. He was forgetting to take his Flomax.   His most recent creatinine on 10/18/2022 was 1.09.  Today, he reports that he is still taking Flomax 0.8 mg. He reports significant urinary symptoms, including frequency, urgency, and nocturia up to five times per night. He reports blood in urine a few weeks ago, but not currently. He currently wears Depends at all times. Overall, his daughters concern seems to the his unrestful night and getting up to void which is also a safety issue.    Notes indicate  that he was treated for 2 UTI's in July and August, presumably by his primary care. Those notes are inaccessible. He was supposedly placed on low-dose antibiotics for 4 weeks ain August.   He currently resides at [facility].   Results for orders placed or performed in visit on 03/04/23  Bladder Scan (Post Void Residual) in office  Result Value Ref Range   PVR 477.0 WU    IPSS     Row Name 03/04/23 1112         International Prostate Symptom Score   How often have you had the sensation of not emptying your bladder? More than half the time     How often  have you had to urinate less than every two hours? More than half the time     How often have you found you stopped and started again several times when you urinated? More than half the time     How often have you found it difficult to postpone urination? Almost always     How often have you had a weak urinary stream? Almost always     How often have you had to strain to start urination? Almost always     Total IPSS Score 27       Quality of Life due to urinary symptoms   If you were to spend the rest of your life with your urinary  condition just the way it is now how would you feel about that? Terrible              Score:  1-7 Mild 8-19 Moderate 20-35 Severe    PMH: Past Medical History:  Diagnosis Date   Arthritis    B12 deficiency    Bladder cancer (HCC)    2 yrs ago   Diabetes (HCC) 09/14/2013   Dysrhythmia    Bradycardia   Hip fracture, left (HCC) 02/04/2016   Hypertension    Non-insulin dependent type 2 diabetes mellitus (HCC)    Sleep apnea     Surgical History: Past Surgical History:  Procedure Laterality Date   cyst on spine  1961   EAR CYST EXCISION     INTRAMEDULLARY (IM) NAIL INTERTROCHANTERIC Left 02/04/2016   Procedure: INTRAMEDULLARY (IM) NAIL INTERTROCHANTRIC;  Surgeon: Christena Flake, MD;  Location: [facility] ORS;  Service: Orthopedics;  Laterality: Left;   TRANSURETHRAL RESECTION OF BLADDER TUMOR N/A 10/24/2014   Procedure: TRANSURETHRAL RESECTION OF BLADDER TUMOR (TURBT);  Surgeon: Orson Ape, MD;  Location: [facility] ORS;  Service: Urology;  Laterality: N/A;    Home Medications:  Allergies as of 03/04/2023   No Known Allergies      Medication List        Accurate as of March 04, 2023 12:01 PM. If you have any questions, ask your nurse or doctor.          STOP taking these medications    diltiazem 120 MG 24 hr capsule Commonly known as: CARDIZEM CD Stopped by: Vanna Scotland       TAKE these medications    acetaminophen 325 MG tablet Commonly known as: TYLENOL Take 650 mg by mouth every 4 (four) hours as needed for mild pain, moderate pain or fever.   allopurinol 100 MG tablet Commonly known as: ZYLOPRIM Take 100 mg by mouth daily.   Artificial Tears PF 0.1-0.3 % Soln Generic drug: Dextran 70-Hypromellose (PF) Place 1 drop into both eyes 2 (two) times daily.   aspirin EC 81 MG tablet Take 81 mg by mouth daily.   Cod Liver Oil 1000 MG Caps Take 1 capsule by mouth daily.   cyanocobalamin 1000 MCG tablet Commonly known as: VITAMIN B12 Take 1,000  mcg by mouth daily.   docusate sodium 100 MG capsule Commonly known as: COLACE Take 1 capsule (100 mg total) by mouth 2 (two) times daily.   FREESTYLE LITE test strip Generic drug: glucose blood USE 1 STRIP TO CHECK GLUCOSE TWICE DAILY   furosemide 20 MG tablet Commonly known as: LASIX Take 20 mg by mouth as needed.   Glucerna Liqd Take 237 mLs by mouth.   Glucosamine Relief 1000 MG Tabs Generic drug: Glucosamine Sulfate Take 1,000 mg by mouth  daily.   ipratropium 0.03 % nasal spray Commonly known as: ATROVENT Place 2 sprays into both nostrils 3 (three) times daily. Hold for overly dry nose.   lovastatin 20 MG tablet Commonly known as: MEVACOR Take 20 mg by mouth daily at 6 PM.   metFORMIN 500 MG tablet Commonly known as: GLUCOPHAGE Take 500 mg by mouth daily. Take with a meal.   metoprolol tartrate 50 MG tablet Commonly known as: LOPRESSOR Take 1 tablet (50 mg total) by mouth 2 (two) times daily.   NON FORMULARY CPAP   Omega 3 1000 MG Caps Take 3,000 mg by mouth 2 (two) times daily.   PreserVision AREDS 2 Caps Take by mouth. Take one capsule by mouth twice daily for eye health.   Psyllium 400 MG Caps Take 1 capsule by mouth daily.   NATURAL FIBER LAXATIVE PO Take by mouth.   Refresh 1.4-0.6 % Soln Generic drug: Polyvinyl Alcohol-Povidone PF Place 1 drop into both eyes 2 (two) times daily as needed (dry eyes).   RISA-BID PROBIOTIC PO Take by mouth. What changed: Another medication with the same name was removed. Continue taking this medication, and follow the directions you see here. Changed by: Vanna Scotland   Systane Nighttime Oint Apply to both eyes at bedtime  as needed for eye irritation.   tamsulosin 0.4 MG Caps capsule Commonly known as: FLOMAX Take 0.8 mg by mouth daily.   traZODone 50 MG tablet Commonly known as: DESYREL Take 50 mg by mouth at bedtime.   VITAMIN D PO Take 1,000 Units by mouth daily.        Family  History: Family History  Problem Relation Age of Onset   Diabetes Neg Hx     Social History:  reports that he quit smoking about 46 years ago. His smoking use included cigarettes. He has never used smokeless tobacco. He reports current alcohol use. He reports that he does not use drugs.   Physical Exam: BP (!) 91/55   Pulse (!) 55   Wt 144 lb (65.3 kg)   BMI 19.53 kg/m   Constitutional:  Alert and oriented, No acute distress. He presents today with his daughter. In wheel chair; able to answer some but not all questions.  Wearing pull up diaper.   HEENT:  AT, moist mucus membranes.  Trachea midline, no masses. Neurologic: Grossly intact, no focal deficits, moving all 4 extremities. Psychiatric: Normal mood and affect.   Pertinent Imaging: EXAM: CT CHEST, ABDOMEN, AND PELVIS WITH CONTRAST   CT THORACIC SPINE WITH CONTRAST   TECHNIQUE: Multidetector CT imaging of the chest, abdomen and pelvis was performed following the standard protocol during bolus administration of intravenous contrast.   Multidetector CT imaging of the thoracic spine was performed following the standard protocol during bolus administration of intravenous contrast.   RADIATION DOSE REDUCTION: This exam was performed according to the departmental dose-optimization program which includes automated exposure control, adjustment of the mA and/or kV according to patient size and/or use of iterative reconstruction technique.   CONTRAST:  OMNIPAQUE IOHEXOL 300 MG/ML  SOLN   COMPARISON:  CT abdomen pelvis, 06/08/2020 chest radiographs, 10/05/2014   FINDINGS: CT CHEST FINDINGS   Cardiovascular: Aortic atherosclerosis. Normal cardiomegaly. Three-vessel coronary artery calcifications. No pericardial effusion.   Mediastinum/Nodes: No enlarged mediastinal, hilar, or axillary lymph nodes. Small volume anterior pneumomediastinum (series 2, image 42). Thyroid gland, trachea, and esophagus demonstrate no  significant findings.   Lungs/Pleura: Moderate left, small right pleural effusions and associated atelectasis  or consolidation. Mild centrilobular emphysema. Tiny, less than 5% left pneumothorax (series 4, image 34).   Musculoskeletal: No chest wall mass or suspicious osseous lesions identified. Very subtle, nondisplaced fractures of the posterior left third and fourth ribs, much better appreciated by comparison examination of the cervical spine (series 4, image 19). Subacute fractures of the anterior left seventh and eighth ribs, without acute displaced rib fracture (series 2, image 53, 59).   CT ABDOMEN PELVIS FINDINGS   Hepatobiliary: No solid liver abnormality is seen. No gallstones, gallbladder wall thickening, or biliary dilatation.   Pancreas: Unremarkable. No pancreatic ductal dilatation or surrounding inflammatory changes.   Spleen: Normal in size without significant abnormality.   Adrenals/Urinary Tract: Adrenal glands are unremarkable. Kidneys are normal, without renal calculi, solid lesion, or hydronephrosis. Bladder is unremarkable.   Stomach/Bowel: Stomach is within normal limits. Appendix is normal. No evidence of bowel wall thickening, distention, or inflammatory changes.   Vascular/Lymphatic: Aortic atherosclerosis. No enlarged abdominal or pelvic lymph nodes.   Reproductive: No mass or other abnormality.   Other: Large right inguinal hernia containing the cecal base, incompletely imaged (series 2, image 120) no ascites.   Musculoskeletal: No acute osseous findings.   CT THORACIC SPINE FINDINGS   Alignment: Exaggerated thoracic kyphosis.   Vertebral bodies: Acute superior endplate deformity of T12 (series 5, image 62). Unchanged severe anterior wedge deformity of T8 (series 5, image 60). Age indeterminate anterior wedge deformity of T2 (series 5, image 60).   Disc spaces: Intact.   Paraspinous soft tissues: Paraspinous soft tissue stranding  about T12 without evidence of discrete fluid collection or epidural hematoma (series 1, image 36).   IMPRESSION: 1. Tiny, less than 5% left pneumothorax. 2. Small volume anterior pneumomediastinum. 3. Moderate left, small right pleural effusions and associated atelectasis or consolidation. 4. Acute superior endplate deformity of T12. Unchanged severe anterior wedge deformity of T8. Age indeterminate anterior wedge deformity of T2. 5. Paraspinous soft tissue stranding about T12 without evidence of discrete fluid collection or epidural hematoma. 6. Very subtle, nondisplaced fractures of the posterior left third and fourth ribs 7. Subacute fractures of the anterior left seventh and eighth ribs, without acute displaced rib fracture 8. No CT evidence of acute traumatic injury to the organs of the abdomen or pelvis. 9. Emphysema. 10. Coronary artery disease.   These results were called by telephone at the time of interpretation on 10/17/2022 at 8:11 pm to Dr. Tresa Endo [facility] , who verbally acknowledged these results.   Aortic Atherosclerosis (ICD10-I70.0) and Emphysema (ICD10-J43.9).     Electronically Signed   By: Jearld Lesch M.D.   On: 10/17/2022 20:20  This was personally reviewed and I agree with the radiologic interpretation.    Assessment & Plan:    1. Urinary retention/ incomplete bladder emptying - Continue Flomax 0.8 mg daily. - Consider adding finasteride to shrink the prostate. -poor surgical candidate and also decline this option  2. Urinary incontinence/ nocturia - Discussed the option of using a condom catheter at night to reduce nighttime unsupervised nighttime wakening and prevent falls. - Alternatively, consider catheterization before bedtime to ensure complete bladder emptying; will check with facility to see if this is possible;  - Written  orders provided for catheterization before bedtime and follow up in one month to assess effectiveness. - Schedule a PA  visit for fitting of a condom catheter. - PVR today is 477 mL  3. Recurrent UTI -likely function of #1, would benefit from regular intermittent catheterization  4.  Gross hematuria/ history of bladder cancer -Discussion today about goals of care; both patient and his daughter agree that even if he had a recurrence of bladder cancer, they would not want to intervene and as such, role of cystoscopy and further evaluation for gross hematuria is limited.  We agreed to defer any further/future cystoscopy.  Return in about 1 month (around 04/03/2023) for to evaluate the effectiveness of the catheterization or condom catheter.   [facility] [address] [place], [place] [address] [phone]  I spent 65 total minutes on the day of the encounter including pre-visit review of the medical record, face-to-face time with the patient, and post visit ordering of labs/imaging/tests.

## 2023-03-10 ENCOUNTER — Ambulatory Visit: Payer: Medicare Other | Admitting: Urology

## 2023-03-16 ENCOUNTER — Encounter: Payer: Self-pay | Admitting: Urology

## 2023-03-24 ENCOUNTER — Ambulatory Visit: Payer: BLUE CROSS/BLUE SHIELD | Admitting: Urology

## 2023-04-07 ENCOUNTER — Ambulatory Visit (INDEPENDENT_AMBULATORY_CARE_PROVIDER_SITE_OTHER): Payer: Medicare Other | Admitting: Urology

## 2023-04-07 DIAGNOSIS — R32 Unspecified urinary incontinence: Secondary | ICD-10-CM | POA: Diagnosis not present

## 2023-04-07 DIAGNOSIS — N390 Urinary tract infection, site not specified: Secondary | ICD-10-CM | POA: Diagnosis not present

## 2023-04-07 DIAGNOSIS — R339 Retention of urine, unspecified: Secondary | ICD-10-CM

## 2023-04-07 NOTE — Progress Notes (Incomplete)
I,Amy L Pierron,acting as a scribe for Vanna Scotland, MD.,have documented all relevant documentation on the behalf of Vanna Scotland, MD,as directed by  Vanna Scotland, MD while in the presence of Vanna Scotland, MD.  04/07/2023 2:01 PM   Herbert Phillips 1926-05-08 [id number]  Referring provider: Lauro Regulus, MD [address] [facility] [place],  [place] [address]  Chief Complaint  Patient presents with   Follow-up    HPI: 89 year-old male with a personal history of BPH with incomplete bladder emptying presents today for a one month follow-up.  He also has a personal history of bladder cancer, high grade invasive urothelial carcinoma and micropapillary component. He received Cisplatin therapy with radiation in August 2016. His last cystoscopy was in 2018. His most recent cross sectional imaging this year showed no evidence of disease.  He has a chronic long standing history of urinary retention.   His creatinine is fairly preserved.   At his last visit his PVR was 477 and his primary concern was nocturia. Nighttime water management options discussed and ultimately they elected to have him catheterized by his facility before bed to empty his bladder. Also discussed adding Finasteride to his regimen.   His catheterized volumes before bed volumes range from 100, but primarily are in the 400 range, highest being 600.  He is accompanied by his daughter today.   He reports he sleeps all night after they empty his catheter at 10pm. He feels he gets enough rest and doesn't wet the bed.  Shortly after his last visit here he had a UTI and was put on an antibiotic. He hasn't had another one since. A while back Dr. Dareen Piano put him on a low dose antibiotic for a month.  Otherwise he is doing well, walks carefully, and doesn't have any pain.   PMH: Past Medical History:  Diagnosis Date   Arthritis    B12 deficiency    Bladder cancer (HCC)    2 yrs  ago   Diabetes (HCC) 09/14/2013   Dysrhythmia    Bradycardia   Hip fracture, left (HCC) 02/04/2016   Hypertension    Non-insulin dependent type 2 diabetes mellitus (HCC)    Sleep apnea     Surgical History: Past Surgical History:  Procedure Laterality Date   cyst on spine  1961   EAR CYST EXCISION     INTRAMEDULLARY (IM) NAIL INTERTROCHANTERIC Left 02/04/2016   Procedure: INTRAMEDULLARY (IM) NAIL INTERTROCHANTRIC;  Surgeon: Christena Flake, MD;  Location: [facility] ORS;  Service: Orthopedics;  Laterality: Left;   TRANSURETHRAL RESECTION OF BLADDER TUMOR N/A 10/24/2014   Procedure: TRANSURETHRAL RESECTION OF BLADDER TUMOR (TURBT);  Surgeon: Orson Ape, MD;  Location: [facility] ORS;  Service: Urology;  Laterality: N/A;    Home Medications:  Allergies as of 04/07/2023   No Known Allergies      Medication List        Accurate as of April 07, 2023  2:01 PM. If you have any questions, ask your nurse or doctor.          STOP taking these medications    ipratropium 0.03 % nasal spray Commonly known as: ATROVENT   tamsulosin 0.4 MG Caps capsule Commonly known as: FLOMAX       TAKE these medications    acetaminophen 325 MG tablet Commonly known as: TYLENOL Take 650 mg by mouth every 4 (four) hours as needed for mild pain, moderate pain or fever.   allopurinol  100 MG tablet Commonly known as: ZYLOPRIM Take 100 mg by mouth daily.   Artificial Tears PF 0.1-0.3 % Soln Generic drug: Dextran 70-Hypromellose (PF) Place 1 drop into both eyes 2 (two) times daily.   aspirin EC 81 MG tablet Take 81 mg by mouth daily.   bacitracin ophthalmic ointment Place 1 Application into the left eye.   cholecalciferol 25 MCG (1000 UNIT) tablet Commonly known as: VITAMIN D3 Take 1,000 Units by mouth daily.   Cod Liver Oil 1000 MG Caps Take 1 capsule by mouth daily.   cyanocobalamin 1000 MCG tablet Commonly known as: VITAMIN B12 Take 1,000 mcg by mouth daily.   docusate sodium 100  MG capsule Commonly known as: COLACE Take 1 capsule (100 mg total) by mouth 2 (two) times daily.   FREESTYLE LITE test strip Generic drug: glucose blood USE 1 STRIP TO CHECK GLUCOSE TWICE DAILY   furosemide 20 MG tablet Commonly known as: LASIX Take 20 mg by mouth as needed.   Glucerna Liqd Take 237 mLs by mouth.   Glucosamine Relief 1000 MG Tabs Generic drug: Glucosamine Sulfate Take 1,000 mg by mouth daily.   lovastatin 20 MG tablet Commonly known as: MEVACOR Take 20 mg by mouth daily at 6 PM.   metFORMIN 500 MG tablet Commonly known as: GLUCOPHAGE Take 500 mg by mouth daily. Take with a meal.   metoprolol tartrate 50 MG tablet Commonly known as: LOPRESSOR Take 1 tablet (50 mg total) by mouth 2 (two) times daily.   NATURAL FIBER LAXATIVE PO Take by mouth. What changed: Another medication with the same name was removed. Continue taking this medication, and follow the directions you see here.   NON FORMULARY CPAP   Omega 3 1000 MG Caps Take 3,000 mg by mouth 2 (two) times daily.   PreserVision AREDS 2 Caps Take by mouth. Take one capsule by mouth twice daily for eye health.   Refresh 1.4-0.6 % Soln Generic drug: Polyvinyl Alcohol-Povidone PF Place 1 drop into both eyes 2 (two) times daily as needed (dry eyes).   RISA-BID PROBIOTIC PO Take by mouth.   Systane Nighttime Oint Apply to both eyes at bedtime  as needed for eye irritation.   traZODone 50 MG tablet Commonly known as: DESYREL Take 50 mg by mouth at bedtime.       Family History: Family History  Problem Relation Age of Onset   Diabetes Neg Hx     Social History:  reports that he quit smoking about 46 years ago. His smoking use included cigarettes. He has never used smokeless tobacco. He reports current alcohol use. He reports that he does not use drugs.   Physical Exam: Constitutional:  Alert and oriented, No acute distress.  Hearing impaired, in wheelchair.  Accompanied by daughter  today. HEENT: [place] AT, moist mucus membranes.  Trachea midline, no masses. Neurologic: Grossly intact, no focal deficits, moving all 4 extremities. Psychiatric: Normal mood and affect.   Assessment & Plan:    1. Urinary retention/ incomplete bladder emptying - Symptoms improved. Plan to discontinue Flomax given frailty/gait instability. If he starts having problems again, will restart this and his daughter will update using MyChart.  Risk-benefit of this medication discussed in detail. -Consider catheterizing in the morning if he does develop any urinary issues during the daytime.   2. Urinary incontinence/ nocturia - Catheterization before bedtime by the nurses at his facility has been successful. He sleeps through the night. Plan to continue this process.  3. Recurrent  UTI  - Explained to his daughter that with the catheterization he will probably continue to grow bacteria.  We discussed the concept of chronic bacterial colonization.  When he is symptoms of altered mental status, it is imperative to rule out other etiologies including stroke, pneumonia, etc. then to always assume symptoms are for bladder infection.  Return if symptoms worsen or fail to improve.  I have reviewed the above documentation for accuracy and completeness, and I agree with the above.   Vanna Scotland, MD    [facility] [address] [place], [place] [address] [phone]

## 2023-07-08 ENCOUNTER — Encounter: Payer: Self-pay | Admitting: Urology

## 2024-01-26 NOTE — Progress Notes (Signed)
 Herbert Phillips is a 89 y.o. male seen for follow up visit at [facility]  CHIEF COMPLAINT:  Follow up medical problems    HISTORY OF PRESENT ILLNESS:       HTN (hypertension) With some mild edema and stasis dermatitis with the minimal edema, hypotensive at times as well     Resides in skilled nursing facility Has needed long term skilled and seems to need going forward.    Anxiety reaction Seems to be doing well, no issue with his low dose prn benzo thankfully   No fever chills sweats are noted, no nausea vomiting or diarrhea have been noted, no chest pain or sob either   Past Medical History:  Diagnosis Date  . Acute deep vein thrombosis (DVT) of left lower extremity (CMS/HHS-HCC)   . Anxiety reaction 12/14/2023  . Arrhythmia   . Arthritis   . Cancer (CMS/HHS-HCC) April, 2016   Bladder Cancer  . Chronic atrial fibrillation (CMS/HHS-HCC) 10/21/2022  . Diabetes mellitus type 2, uncomplicated (CMS/HHS-HCC)   . Hyperlipidemia   . Hypertension   . Sleep apnea    Uses CPAP  . Stroke (CMS/HHS-HCC)    TIA's  . Venous disease Legs    Past Surgical History:  Procedure Laterality Date  . Reduction and internal fixation of displaced comminuted subtrochanteic left hip fracture with biomet affyxis TFN nail  Left 02/04/2016   Dr.Poggi   . COLONOSCOPY  2003 & 2008   Dr. Jinny - no polyps      Social History   Socioeconomic History  . Marital status: Married  Tobacco Use  . Smoking status: Former    Current packs/day: 0.00    Types: Cigarettes    Quit date: 06/24/1975    Years since quitting: 48.6  . Smokeless tobacco: Never  . Tobacco comments:    Quit 40 years ago  Vaping Use  . Vaping status: Never Used  Substance and Sexual Activity  . Alcohol use: No  . Drug use: No  . Sexual activity: Not Currently    Partners: Female  Social History Narrative   Education: HS   Occupation: Retired   Presenter, broadcasting: golf   Marital Status: married   Social Drivers of  Health   Food Insecurity: No Food Insecurity (10/18/2022)   Received from [facility]   Hunger Vital Sign   . Within the past 12 months, you worried that your food would run out before you got the money to buy more.: Never true   . Within the past 12 months, the food you bought just didn't last and you didn't have money to get more.: Never true  Transportation Needs: No Transportation Needs (10/18/2022)   Received from [facility]   . Lack of Transportation (Medical): No   . Lack of Transportation (Non-Medical): No     Medication reviewed and signed on the nursing facility chart as appropriate  view all Weight:       view all Blood Pressure: 118 /  64 mmHg 01/26/2024 06:49  /  Ewilhite (Manual)  view all Temperature: 97.7 F 01/26/2024 06:49   Ewilhite (Manual)  Low of 97.8 exceeded view all Pulse: 60 bpm 01/26/2024 06:49   Ewilhite (Manual)  view all Respirations: 16 Breaths/min 01/26/2024 06:49   Ewilhite (Manual)  view all Blood Sugar: 89.0 mg/dL 06/27/7972 94:57   MJah (Manual)  view all O2 Saturation: 97.0% 01/26/2024 06:49   No acute distress No icterus  No JVD Lungs; clear  to ascultation Heart; Regular rate and rhythm  Abdomen; Soft and flat, normal bowel sounds Extremities; No clubbing, cyanosis or edema  Available labs reviewed in the nursing home chart.   ASSESSMENT  AND PLAN: Diagnoses and all orders for this visit:  Chronic atrial fibrillation (CMS/HHS-HCC)  Routine general medical examination at a health care facility  Type 2 diabetes mellitus with diabetic polyneuropathy, without long-term current use of insulin  (CMS/HHS-HCC)  Resides in skilled nursing facility Assessment & Plan: Has needed long term skilled and seems to need going forward.     Anxiety reaction Assessment & Plan: Seems to be doing well, no issue with his low dose prn benzo thankfully      All problems listed above are stable and will be followed except at noted  otherwise. This patient is a complicated patient requiring skilled nursing care and multiple medications and multiple medical problems which need and will be monitored. Decompensation remains a risk and the medication regimen will be reordered as needed and adjusted. I have reordered all the medications as is required. Aggressive care is not indicated given illnesses and advanced age. Will try not to overburden this patient with testing and non essential treatments.       MEDICARE WELLNESS VISIT   PROVIDERS RENDERING CARE Dr. Lenon, Hospice is following   FUNCTIONAL ASSESSMENT  (1) Hearing: Hearing is decreased but functional (2) Risk of Falls: Activity and any attempted gait is watched closely by facility staff (3) Home Safety; Nursing home is safe and secure (4) Activities of Daily Living; Full general care and ADL help provided at the patients skilled facility  DEPRESSION SCREENING No clear depression symptoms such as crying spells or blue mood  COGNITIVE SCREENING Memory and cognitive dysfunction is noted    PREVENTION PLAN Aggressive screening and preventive strategies will not be helpful given disease burden and life expectancy  Pneumonia: Per facility protocol. Influenza: Yearly flu vaccine  Smoking Cessation: NA   OTHER PERSONALIZED HEALTH ADVISE Weight sustaining diet and activity with staffs health as tolerated including social activities as feasible.   END OF LIFE CARE WANTS No code given overall health is warranted       Layman Lenon MD

## 2024-02-08 ENCOUNTER — Inpatient Hospital Stay
Admission: EM | Admit: 2024-02-08 | Discharge: 2024-02-11 | DRG: 481 | Disposition: A | Discharge status: Skilled Nursing Facility | Source: Skilled Nursing Facility | Attending: Internal Medicine | Admitting: Internal Medicine

## 2024-02-08 ENCOUNTER — Emergency Department

## 2024-02-08 ENCOUNTER — Other Ambulatory Visit: Payer: Self-pay

## 2024-02-08 DIAGNOSIS — Z515 Encounter for palliative care: Secondary | ICD-10-CM

## 2024-02-08 DIAGNOSIS — I480 Paroxysmal atrial fibrillation: Secondary | ICD-10-CM | POA: Diagnosis present

## 2024-02-08 DIAGNOSIS — I10 Essential (primary) hypertension: Secondary | ICD-10-CM | POA: Diagnosis present

## 2024-02-08 DIAGNOSIS — I482 Chronic atrial fibrillation, unspecified: Secondary | ICD-10-CM | POA: Diagnosis present

## 2024-02-08 DIAGNOSIS — Z66 Do not resuscitate: Secondary | ICD-10-CM | POA: Diagnosis present

## 2024-02-08 DIAGNOSIS — D62 Acute posthemorrhagic anemia: Secondary | ICD-10-CM | POA: Diagnosis not present

## 2024-02-08 DIAGNOSIS — Z87891 Personal history of nicotine dependence: Secondary | ICD-10-CM | POA: Diagnosis not present

## 2024-02-08 DIAGNOSIS — Y92129 Unspecified place in nursing home as the place of occurrence of the external cause: Secondary | ICD-10-CM

## 2024-02-08 DIAGNOSIS — S72001A Fracture of unspecified part of neck of right femur, initial encounter for closed fracture: Secondary | ICD-10-CM | POA: Diagnosis not present

## 2024-02-08 DIAGNOSIS — Z8673 Personal history of transient ischemic attack (TIA), and cerebral infarction without residual deficits: Secondary | ICD-10-CM

## 2024-02-08 DIAGNOSIS — W010XXA Fall on same level from slipping, tripping and stumbling without subsequent striking against object, initial encounter: Secondary | ICD-10-CM | POA: Diagnosis present

## 2024-02-08 DIAGNOSIS — Z681 Body mass index (BMI) 19 or less, adult: Secondary | ICD-10-CM

## 2024-02-08 DIAGNOSIS — E785 Hyperlipidemia, unspecified: Secondary | ICD-10-CM | POA: Diagnosis present

## 2024-02-08 DIAGNOSIS — S72141A Displaced intertrochanteric fracture of right femur, initial encounter for closed fracture: Secondary | ICD-10-CM | POA: Diagnosis present

## 2024-02-08 DIAGNOSIS — D696 Thrombocytopenia, unspecified: Secondary | ICD-10-CM | POA: Diagnosis present

## 2024-02-08 DIAGNOSIS — Z7982 Long term (current) use of aspirin: Secondary | ICD-10-CM

## 2024-02-08 DIAGNOSIS — F0390 Unspecified dementia without behavioral disturbance: Secondary | ICD-10-CM | POA: Diagnosis present

## 2024-02-08 DIAGNOSIS — Z8551 Personal history of malignant neoplasm of bladder: Secondary | ICD-10-CM

## 2024-02-08 DIAGNOSIS — Z79899 Other long term (current) drug therapy: Secondary | ICD-10-CM | POA: Diagnosis not present

## 2024-02-08 DIAGNOSIS — S72009A Fracture of unspecified part of neck of unspecified femur, initial encounter for closed fracture: Secondary | ICD-10-CM | POA: Diagnosis present

## 2024-02-08 DIAGNOSIS — R262 Difficulty in walking, not elsewhere classified: Secondary | ICD-10-CM | POA: Diagnosis present

## 2024-02-08 DIAGNOSIS — R32 Unspecified urinary incontinence: Secondary | ICD-10-CM | POA: Diagnosis present

## 2024-02-08 DIAGNOSIS — D72829 Elevated white blood cell count, unspecified: Secondary | ICD-10-CM | POA: Diagnosis present

## 2024-02-08 DIAGNOSIS — G4730 Sleep apnea, unspecified: Secondary | ICD-10-CM | POA: Diagnosis present

## 2024-02-08 DIAGNOSIS — R636 Underweight: Secondary | ICD-10-CM | POA: Diagnosis present

## 2024-02-08 DIAGNOSIS — I959 Hypotension, unspecified: Secondary | ICD-10-CM | POA: Diagnosis present

## 2024-02-08 DIAGNOSIS — Z7984 Long term (current) use of oral hypoglycemic drugs: Secondary | ICD-10-CM | POA: Diagnosis not present

## 2024-02-08 DIAGNOSIS — E1169 Type 2 diabetes mellitus with other specified complication: Secondary | ICD-10-CM | POA: Diagnosis present

## 2024-02-08 LAB — CBC WITH DIFFERENTIAL/PLATELET
Abs Immature Granulocytes: 0.07 K/uL (ref 0.00–0.07)
Basophils Absolute: 0 K/uL (ref 0.0–0.1)
Basophils Relative: 0 %
Eosinophils Absolute: 0.1 K/uL (ref 0.0–0.5)
Eosinophils Relative: 0 %
HCT: 34.3 % — ABNORMAL LOW (ref 39.0–52.0)
Hemoglobin: 10.9 g/dL — ABNORMAL LOW (ref 13.0–17.0)
Immature Granulocytes: 1 %
Lymphocytes Relative: 19 %
Lymphs Abs: 2.4 K/uL (ref 0.7–4.0)
MCH: 30.2 pg (ref 26.0–34.0)
MCHC: 31.8 g/dL (ref 30.0–36.0)
MCV: 95 fL (ref 80.0–100.0)
Monocytes Absolute: 1.7 K/uL — ABNORMAL HIGH (ref 0.1–1.0)
Monocytes Relative: 14 %
Neutro Abs: 8.4 K/uL — ABNORMAL HIGH (ref 1.7–7.7)
Neutrophils Relative %: 66 %
Platelets: 142 K/uL — ABNORMAL LOW (ref 150–400)
RBC: 3.61 MIL/uL — ABNORMAL LOW (ref 4.22–5.81)
RDW: 15.7 % — ABNORMAL HIGH (ref 11.5–15.5)
WBC: 12.7 K/uL — ABNORMAL HIGH (ref 4.0–10.5)
nRBC: 0 % (ref 0.0–0.2)

## 2024-02-08 LAB — CBC
HCT: 37.6 % — ABNORMAL LOW (ref 39.0–52.0)
Hemoglobin: 12.5 g/dL — ABNORMAL LOW (ref 13.0–17.0)
MCH: 30.7 pg (ref 26.0–34.0)
MCHC: 33.2 g/dL (ref 30.0–36.0)
MCV: 92.4 fL (ref 80.0–100.0)
Platelets: 148 K/uL — ABNORMAL LOW (ref 150–400)
RBC: 4.07 MIL/uL — ABNORMAL LOW (ref 4.22–5.81)
RDW: 15.6 % — ABNORMAL HIGH (ref 11.5–15.5)
WBC: 14.1 K/uL — ABNORMAL HIGH (ref 4.0–10.5)
nRBC: 0 % (ref 0.0–0.2)

## 2024-02-08 LAB — PROTIME-INR
INR: 1.1 (ref 0.8–1.2)
Prothrombin Time: 15 s (ref 11.4–15.2)

## 2024-02-08 LAB — TYPE AND SCREEN
ABO/RH(D): AB POS
Antibody Screen: NEGATIVE

## 2024-02-08 LAB — BASIC METABOLIC PANEL WITH GFR
Anion gap: 12 (ref 5–15)
BUN: 32 mg/dL — ABNORMAL HIGH (ref 8–23)
CO2: 24 mmol/L (ref 22–32)
Calcium: 8.5 mg/dL — ABNORMAL LOW (ref 8.9–10.3)
Chloride: 101 mmol/L (ref 98–111)
Creatinine, Ser: 1.15 mg/dL (ref 0.61–1.24)
GFR, Estimated: 59 mL/min — ABNORMAL LOW (ref >60)
Glucose, Bld: 142 mg/dL — ABNORMAL HIGH (ref 70–99)
Potassium: 4.2 mmol/L (ref 3.5–5.1)
Sodium: 137 mmol/L (ref 135–145)

## 2024-02-08 LAB — CREATININE, SERUM
Creatinine, Ser: 1.01 mg/dL (ref 0.61–1.24)
GFR, Estimated: >60 mL/min (ref >60)

## 2024-02-08 LAB — GLUCOSE, CAPILLARY: Glucose-Capillary: 123 mg/dL — ABNORMAL HIGH (ref 70–99)

## 2024-02-08 MED ORDER — METOPROLOL TARTRATE 50 MG PO TABS
50.0000 mg | ORAL_TABLET | Freq: Two times a day (BID) | ORAL | Status: DC
  Filled 2024-02-08: qty 1

## 2024-02-08 MED ORDER — ACETAMINOPHEN 650 MG RE SUPP: 650.0000 mg | Freq: Four times a day (QID) | RECTAL | Status: DC | PRN

## 2024-02-08 MED ORDER — ALLOPURINOL 100 MG PO TABS: 100.0000 mg | ORAL_TABLET | Freq: Every day | ORAL | Status: DC

## 2024-02-08 MED ORDER — INSULIN ASPART 100 UNIT/ML IJ SOLN
0.0000 [IU] | Freq: Three times a day (TID) | INTRAMUSCULAR | Status: DC
  Administered 2024-02-09 – 2024-02-10 (×3): 1 [IU] via SUBCUTANEOUS
  Administered 2024-02-11: 2 [IU] via SUBCUTANEOUS
  Filled 2024-02-08 (×4): qty 1

## 2024-02-08 MED ORDER — MORPHINE SULFATE (PF) 2 MG/ML IV SOLN: 2.0000 mg | INTRAVENOUS | Status: DC | PRN

## 2024-02-08 MED ORDER — TRAZODONE HCL 50 MG PO TABS
50.0000 mg | ORAL_TABLET | Freq: Every day | ORAL | Status: DC
  Administered 2024-02-10: 50 mg via ORAL
  Filled 2024-02-08 (×2): qty 1

## 2024-02-08 MED ORDER — ACETAMINOPHEN 325 MG PO TABS: 650.0000 mg | ORAL_TABLET | Freq: Four times a day (QID) | ORAL | Status: DC | PRN

## 2024-02-08 MED ORDER — ENOXAPARIN SODIUM 40 MG/0.4ML IJ SOSY
40.0000 mg | PREFILLED_SYRINGE | INTRAMUSCULAR | Status: DC
  Administered 2024-02-08: 40 mg via SUBCUTANEOUS
  Filled 2024-02-08: qty 0.4

## 2024-02-08 MED ORDER — CEFAZOLIN SODIUM-DEXTROSE 2-4 GM/100ML-% IV SOLN
2.0000 g | INTRAVENOUS | Status: AC
  Administered 2024-02-09: 2 g via INTRAVENOUS

## 2024-02-08 MED ORDER — INSULIN ASPART 100 UNIT/ML IJ SOLN
0.0000 [IU] | Freq: Every day | INTRAMUSCULAR | Status: DC
  Administered 2024-02-09: 2 [IU] via SUBCUTANEOUS
  Filled 2024-02-08: qty 1

## 2024-02-08 NOTE — ED Triage Notes (Signed)
 Pt arrives via [facility] from [facility] [place] with c/o a right hip fracture that was confirmed via xray at the facility. Pt states that he had a fall 2 days ago, where he tripped leaving the building where he lives. There is noticeable shortening and rotation to the right leg. Pt denies pain, LOC. Pt is A&Ox4.

## 2024-02-08 NOTE — Progress Notes (Signed)
 Full consult note and discussion with patient to follow tomorrow AM. I discussed patient's case and treatment options with patient's daughter-in-law over the phone and we are in agreement to proceed with surgery tomorrow.   - Plan for surgery tomorrow, likely mid to late morning. - NPO after midnight - Hold anticoagulation - Admit to Hospitalist team.

## 2024-02-08 NOTE — H&P (Signed)
 History and Physical    Herbert Phillips FMW:[id number] DOB: September 17, 1925 DOA: 02/08/2024  DOS: the patient was seen and examined on 02/08/2024  PCP: Lenon Layman ORN, MD   Patient coming from: Hospice nursing home  I have personally briefly reviewed patient's old medical records in [facility]  Chief Complaint: Fall  HPI: Herbert Phillips is a pleasant 89 y.o. male with medical history significant for DM, bladder cancer, HTN, sleep apnea, left hip fracture under hospice care at skilled nursing facility, who was brought in from [facility] [place] with complaints of right hip fracture that was confirmed via x-ray at the facility done today.  Patient daughter-in-law was at bedside who provided the history that patient had fallen 2 days ago at the nursing home.  X-ray was done which showed right hip fracture.  Patient complains of mild to moderate pain on the right hip denies any other symptoms.  He denies any fever chills nausea vomiting.  He denies any chest pain shortness of breath palpitations.  ED Course: Upon arrival to the ED, patient is found to right hip fracture on x-ray.  Patient was given pain medications.  Dr. Tobie from orthopedic was consulted.  Hospital service was consulted for evaluation for admission.  Review of Systems:  ROS  All other systems negative except as noted in the HPI.  Past Medical History:  Diagnosis Date   Arthritis    B12 deficiency    Bladder cancer (HCC)    2 yrs ago   Diabetes (HCC) 09/14/2013   Dysrhythmia    Bradycardia   Hip fracture, left (HCC) 02/04/2016   Hypertension    Non-insulin  dependent type 2 diabetes mellitus (HCC)    Sleep apnea     Past Surgical History:  Procedure Laterality Date   cyst on spine  1961   EAR CYST EXCISION     INTRAMEDULLARY (IM) NAIL INTERTROCHANTERIC Left 02/04/2016   Procedure: INTRAMEDULLARY (IM) NAIL INTERTROCHANTRIC;  Surgeon: Norleen JINNY Maltos, MD;  Location: [facility] ORS;  Service:  Orthopedics;  Laterality: Left;   TRANSURETHRAL RESECTION OF BLADDER TUMOR N/A 10/24/2014   Procedure: TRANSURETHRAL RESECTION OF BLADDER TUMOR (TURBT);  Surgeon: Ozell JONELLE Burkes, MD;  Location: [facility] ORS;  Service: Urology;  Laterality: N/A;     reports that he quit smoking about 47 years ago. His smoking use included cigarettes. He has never used smokeless tobacco. He reports current alcohol use. He reports that he does not use drugs.  No Known Allergies  Family History  Problem Relation Age of Onset   Diabetes Neg Hx     Prior to Admission medications   Medication Sig Start Date End Date Taking? Authorizing Provider  acetaminophen  (TYLENOL ) 325 MG tablet Take 650 mg by mouth every 4 (four) hours as needed for mild pain, moderate pain or fever.    [provider]  allopurinol  (ZYLOPRIM ) 100 MG tablet Take 100 mg by mouth daily. 01/13/18   [provider]  aspirin  EC 81 MG tablet Take 81 mg by mouth daily.     [provider]  bacitracin  ophthalmic ointment Place 1 Application into the left eye. 03/16/23   [provider]  cholecalciferol (VITAMIN D3) 25 MCG (1000 UNIT) tablet Take 1,000 Units by mouth daily.    [provider]  Cod Liver Oil 1000 MG CAPS Take 1 capsule by mouth daily.    [provider]  Dextran 70-Hypromellose, PF, (ARTIFICIAL TEARS PF) 0.1-0.3 % SOLN Place 1 drop into both  eyes 2 (two) times daily.    [provider]  docusate sodium  (COLACE) 100 MG capsule Take 1 capsule (100 mg total) by mouth 2 (two) times daily. 02/07/16   Hower, Alm POUR, MD  FREESTYLE LITE test strip USE 1 STRIP TO CHECK GLUCOSE TWICE DAILY 01/04/18   [provider]  furosemide  (LASIX ) 20 MG tablet Take 20 mg by mouth as needed.  04/02/16   [provider]  Glucerna (GLUCERNA) LIQD Take 237 mLs by mouth.    [provider]  Glucosamine Sulfate (GLUCOSAMINE RELIEF) 1000 MG TABS Take 1,000 mg by mouth daily.      [provider]  lovastatin (MEVACOR) 20 MG tablet Take 20 mg by mouth daily at 6 PM.  12/28/14   [provider]  metFORMIN (GLUCOPHAGE) 500 MG tablet Take 500 mg by mouth daily. Take with a meal. 12/13/14   [provider]  metoprolol  tartrate (LOPRESSOR ) 50 MG tablet Take 1 tablet (50 mg total) by mouth 2 (two) times daily. 10/20/22   Josette Ade, MD  Multiple Vitamins-Minerals (PRESERVISION AREDS 2) CAPS Take by mouth. Take one capsule by mouth twice daily for eye health.    [provider]  NON FORMULARY CPAP    [provider]  Omega 3 1000 MG CAPS Take 3,000 mg by mouth 2 (two) times daily.    [provider]  Polyvinyl Alcohol-Povidone PF (REFRESH) 1.4-0.6 % SOLN Place 1 drop into both eyes 2 (two) times daily as needed (dry eyes).    [provider]  Probiotic Product (RISA-BID PROBIOTIC PO) Take by mouth.    [provider]  Psyllium (NATURAL FIBER LAXATIVE PO) Take by mouth.    [provider]  traZODone  (DESYREL ) 50 MG tablet Take 50 mg by mouth at bedtime.    [provider]  vitamin B-12 (CYANOCOBALAMIN ) 1000 MCG tablet Take 1,000 mcg by mouth daily.    [provider]  White Petrolatum -Mineral Oil (SYSTANE NIGHTTIME) OINT Apply to both eyes at bedtime  as needed for eye irritation.    [provider]    Physical Exam: Vitals:   02/08/24 1457 02/08/24 1501  BP: (!) 108/59   Pulse: 75   Resp: 17   Temp: 98 F (36.7 C)   TempSrc: Oral   SpO2: 94%   Weight:  61.2 kg  Height:  6' (1.829 m)    Physical Exam   Constitutional: Alert, awake, calm, comfortable HEENT: Neck supple Respiratory: Clear to auscultation B/L, no wheezing, no rales.  Cardiovascular: Regular rate and rhythm, no murmurs / rubs / gallops. No extremity edema. 2+ pedal pulses. No carotid bruits.  Abdomen: Soft, no tenderness, Bowel sounds positive.  Musculoskeletal: Mild point tenderness on the  right hip fracture site.  No external injuries noted Skin: no rashes, lesions, ulcers. Neurologic: CN 2-12 grossly intact. Sensation intact, No focal deficit identified Psychiatric: Alert and oriented x 3. Normal mood.    Labs on Admission: I have personally reviewed following labs and imaging studies  CBC: Recent Labs  Lab 02/08/24 1525  WBC 12.7*  NEUTROABS 8.4*  HGB 10.9*  HCT 34.3*  MCV 95.0  PLT 142*   Basic Metabolic Panel: Recent Labs  Lab 02/08/24 1525  NA 137  K 4.2  CL 101  CO2 24  GLUCOSE 142*  BUN 32*  CREATININE 1.15  CALCIUM 8.5*   GFR: Estimated Creatinine Clearance: 33.3 mL/min (by C-G formula based on SCr of 1.15 mg/dL). Liver Function  Tests: No results for input(s): AST, ALT, ALKPHOS, BILITOT, PROT, ALBUMIN in the last 168 hours. No results for input(s): LIPASE, AMYLASE in the last 168 hours. No results for input(s): AMMONIA in the last 168 hours. Coagulation Profile: Recent Labs  Lab 02/08/24 1524  INR 1.1   Cardiac Enzymes: No results for input(s): CKTOTAL, CKMB, CKMBINDEX, TROPONINI, TROPONINIHS in the last 168 hours. BNP (last 3 results) No results for input(s): BNP in the last 8760 hours. HbA1C: No results for input(s): HGBA1C in the last 72 hours. CBG: No results for input(s): GLUCAP in the last 168 hours. Lipid Profile: No results for input(s): CHOL, HDL, LDLCALC, TRIG, CHOLHDL, LDLDIRECT in the last 72 hours. Thyroid Function Tests: No results for input(s): TSH, T4TOTAL, FREET4, T3FREE, THYROIDAB in the last 72 hours. Anemia Panel: No results for input(s): VITAMINB12, FOLATE, FERRITIN, TIBC, IRON, RETICCTPCT in the last 72 hours. Urine analysis:    Component Value Date/Time   COLORURINE YELLOW (A) 10/17/2022 1953   APPEARANCEUR CLEAR (A) 10/17/2022 1953   APPEARANCEUR Clear 10/05/2014 1647   LABSPEC 1.023 10/17/2022 1953   LABSPEC 1.011 10/05/2014 1647    PHURINE 5.0 10/17/2022 1953   GLUCOSEU NEGATIVE 10/17/2022 1953   GLUCOSEU Negative 10/05/2014 1647   HGBUR NEGATIVE 10/17/2022 1953   BILIRUBINUR NEGATIVE 10/17/2022 1953   BILIRUBINUR Negative 10/05/2014 1647   KETONESUR 5 (A) 10/17/2022 1953   PROTEINUR NEGATIVE 10/17/2022 1953   NITRITE NEGATIVE 10/17/2022 1953   LEUKOCYTESUR NEGATIVE 10/17/2022 1953   LEUKOCYTESUR Negative 10/05/2014 1647    Radiological Exams on Admission: I have personally reviewed images DG Hip Unilat  With Pelvis 2-3 Views Right Result Date: 02/08/2024 CLINICAL DATA:  Clemens, right hip pain, deformity EXAM: DG HIP (WITH OR WITHOUT PELVIS) 2-3V RIGHT COMPARISON:  None Available. FINDINGS: Frontal view of the pelvis as well as frontal and frogleg lateral views of the right hip are obtained. There is a comminuted intertrochanteric right hip fracture with mild varus angulation at the fracture site. No dislocation. Prior left hip ORIF spanning a healed proximal left femoral fracture. The remainder of the visualized bony pelvis is unremarkable. The bilateral iliac crests are excluded by collimation. IMPRESSION: 1. Comminuted intertrochanteric right hip fracture, with mild varus angulation at the fracture site. Electronically Signed   By: Ozell Daring M.D.   On: 02/08/2024 15:59    EKG: My personal interpretation of EKG shows: Sinus rhythm with premature atrial complex    Assessment/Plan Principal Problem:   Hip fracture (HCC) Active Problems:   Paroxysmal atrial fibrillation with RVR (HCC)   Type 2 diabetes mellitus with hyperlipidemia (HCC)   HTN (hypertension)   Dyslipidemia   Sleep apnea    Assessment and Plan: 89 year old male under hospice W/PMH of HTN, diabetes, atrial fibrillation sleep apnea who was brought in from nursing home for right hip pain and fracture after a fall at the nursing home.  1.  Right hip fracture - He will be admitted to the hospital as inpatient - Dr. Tobie from orthopedics have  seen the patient and advised for possible surgical procedure tomorrow - Pain control - DVT prophylaxis with Eliquis  - Hold Eliquis  tomorrow morning  2.  Atrial fibrillation - Rate well-controlled - Continue metoprolol  and Eliquis   3.  Type 2 diabetes - He will be placed on insulin  sliding scale  4.  HTN - Continue metoprolol  - Continue to monitor blood pressure  5. Leukocytosis - May be related to stress due to fracture - Patient does not have  fever or other signs of infection - Will continue to monitor     DVT prophylaxis: Eliquis  Code Status: DNR/DNI(Do NOT Intubate) Family Communication: Daughter in Social worker at bedside   Disposition Plan: SNF hospice  Consults called: Ortho  Admission status: Inpatient, Med-Surg   Nena Rebel, MD [facility] 02/08/2024, 5:51 PM

## 2024-02-08 NOTE — ED Provider Notes (Signed)
 [facility] Provider Note    Event Date/Time   First MD Initiated Contact with Patient 02/08/24 1506     (approximate)   History   Hip Injury   HPI  Herbert Phillips is a 89 y.o. male  chronic afib not on afib, type II dM, HTN, TIA S from [facility] [place] with c/o a right hip fracture that was confirmed via xray at the facility.  Patient had a fall 2 days ago when he tripped leaving the building where he lives.  He did not seek immediate care as he had no pain but he has been unable to bear weight on that extremity.  Patient normally is able to ambulate with a walker.  He denies any pain currently while lying in bed.  He denies hitting his head or any loss of consciousness headache chest pain or shortness of breath.  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  He presents with his daughter-in-law who contributes to the history  Per IM note today:    Dw pt, he wished er transport and ortho evaluation. I agree as though he is on hospice his death is certainly not imminent and I think he will be much more miserable without this being corrected. He is agreeable but want his poa called and nursing is planning that soon. Will need aggressive pain control if fracture correction cannot be accomplished      Physical Exam   Triage Vital Signs: ED Triage Vitals  Encounter Vitals Group     BP 02/08/24 1457 (!) 108/59     Girls Systolic BP Percentile --      Girls Diastolic BP Percentile --      Boys Systolic BP Percentile --      Boys Diastolic BP Percentile --      Pulse Rate 02/08/24 1457 75     Resp 02/08/24 1457 17     Temp 02/08/24 1457 98 F (36.7 C)     Temp Source 02/08/24 1457 Oral     SpO2 02/08/24 1457 94 %     Weight 02/08/24 1501 135 lb (61.2 kg)     Height 02/08/24 1501 6' (1.829 m)     Head Circumference --      Peak Flow --      Pain Score 02/08/24 1500 0     Pain Loc --      Pain Education --      Exclude from Growth Chart --      Most recent vital signs: Vitals:   02/08/24 1457  BP: (!) 108/59  Pulse: 75  Resp: 17  Temp: 98 F (36.7 C)  SpO2: 94%    Nursing Triage Note reviewed. Vital signs reviewed and patients oxygen saturation is normoxic  General: Patient is well nourished, well developed, awake and alert, resting comfortably in no acute distress Head: Normocephalic and atraumatic Eyes: Normal inspection, extraocular muscles intact, no conjunctival pallor Ear, nose, throat: Normal external exam Neck: Normal range of motion Respiratory: Patient is in no respiratory distress, lungs CTAB Cardiovascular: Patient is not tachycardic, RRR without murmur appreciated GI: Abd SNT with no guarding or rebound  Back: Normal inspection of the back with good strength and range of motion throughout all ext Extremities: pulses intact with good cap refills, no LE pitting edema or calf tenderness Right hip tenderness to palpation right leg is internally rotated and shortened Neuro: The patient is alert and oriented to person, place, and time, appropriately  conversive, with 5/5 bilat UE/LE strength, no gross motor or sensory defects noted. Coordination appears to be adequate. Skin: Warm, dry, and intact Psych: normal mood and affect, no SI or HI  ED Results / Procedures / Treatments   Labs (all labs ordered are listed, but only abnormal results are displayed) Labs Reviewed  CBC WITH DIFFERENTIAL/PLATELET - Abnormal; Notable for the following components:      Result Value   WBC 12.7 (*)    RBC 3.61 (*)    Hemoglobin 10.9 (*)    HCT 34.3 (*)    RDW 15.7 (*)    Platelets 142 (*)    Neutro Abs 8.4 (*)    Monocytes Absolute 1.7 (*)    All other components within normal limits  BASIC METABOLIC PANEL WITH GFR - Abnormal; Notable for the following components:   Glucose, Bld 142 (*)    BUN 32 (*)    Calcium 8.5 (*)    GFR, Estimated 59 (*)    All other components within normal limits  PROTIME-INR  CBC   CREATININE, SERUM  BASIC METABOLIC PANEL WITH GFR  CBC  TYPE AND SCREEN     EKG EKG and rhythm strip are interpreted by myself:   EKG: afib at heart rate of 73, normal QRS duration, QTc 401, nonspecific ST segments and T waves no ectopy EKG not consistent with Acute STEMI Rhythm strip: afib in lead II   RADIOLOGY Xray hip: Proximal hip fracture and radiologist agrees    PROCEDURES:  Critical Care performed: No  Procedures   MEDICATIONS ORDERED IN ED: Medications  enoxaparin  (LOVENOX ) injection 40 mg (has no administration in time range)  acetaminophen  (TYLENOL ) tablet 650 mg (has no administration in time range)    Or  acetaminophen  (TYLENOL ) suppository 650 mg (has no administration in time range)  morphine  (PF) 2 MG/ML injection 2 mg (has no administration in time range)     IMPRESSION / MDM / ASSESSMENT AND PLAN / ED COURSE                                Differential diagnosis includes, but is not limited to, hip fracture, electrolyte derangement anemia, pathologic fracture   ED course: Patient in no acute distress and his extremity is neurovascularly intact.  X-ray taken here does demonstrate a proximal femur fracture.  He had no profound electrolyte derangements or anemia.  Goals of care with this patient is difficult as patient has experienced delirium with prior hospital stays.  Discussion arranged between patient, patient's daughter-in-law and orthopedist to determine goals of care   Clinical Course as of 02/08/24 1722  Mon Feb 08, 2024  1606 DG Hip Unilat  With Pelvis 2-3 Views Right Reached out to Ortho via epic chat [HD]  1606 Basic metabolic panel(!) No profound abnormalities [HD]  1606 CBC with Differential(!) Not acutely anemic [HD]  1614 Dr. Tobie with orthopedics consulted and on the phone with the patient's daughter-in-law discussing the case and what the patient's wishes would be [HD]  1622 Patient and patient's daughter-in-law do want to  move forward with surgery after discussion with Dr. Tobie.  Plan for OR tomorrow.  Will talk to the hospitalist [HD]  1623 Patient to be n.p.o. at midnight [HD]  1625 Will discuss case with hospitalist [HD]    Clinical Course User Index [HD] Nicholaus Rolland BRAVO, MD   -- Data(2/3 categories following were performed): I reviewed  or ordered at least three unique tests, external notes, and/or the history required an independent historian as one of the three requirements as following: At least 3 labs/imaging studies were obtained and/or reviewed. AND  I discussed the management of the patient with the following external physician or qualified healthcare provider: Admitting physician  Risk: This patient has a high risk of morbidity due to further diagnostic testing or treatment. Rationale: Decision made regarding admission  Admit Level 5 - Labs/Rads, Admit, Consult:Orthopedics  Suggested E/M Coding Level: 5, 99285  This level has been selected based on the Feb 18, 2022 CPT guidelines for E/M codes in the Emergency Department based on 2/3 of the CoPA, Data, and Risk.   FINAL CLINICAL IMPRESSION(S) / ED DIAGNOSES   Final diagnoses:  Closed fracture of right hip, initial encounter (HCC)  Inability to walk     Rx / DC Orders   ED Discharge Orders     None        Note:  This document was prepared using Dragon voice recognition software and may include unintentional dictation errors.   Nicholaus Rolland BRAVO, MD 02/08/24 [phone]

## 2024-02-08 NOTE — Progress Notes (Signed)
 Herbert Phillips is a 89 y.o. male seen for follow up visit at [facility] place  CHIEF COMPLAINT:  Follow up medical problems    HISTORY OF PRESENT ILLNESS:  Herbert Phillips fell again over the weekend and was having significant hip pain, xrays were ordered and reportedly results just came back this am. Displaced fracture is noted. He was asleep when I arrived in his room, pain is mild now he says. Quite significant pain when moved at all per nursing.    No fever chills sweats are noted, no nausea vomiting or diarrhea have been noted, no chest pain or sob either   Past Medical History:  Diagnosis Date  . Acute deep vein thrombosis (DVT) of left lower extremity (CMS/HHS-HCC)   . Anxiety reaction 12/14/2023  . Arrhythmia   . Arthritis   . Cancer (CMS/HHS-HCC) April, 2016   Bladder Cancer  . Chronic atrial fibrillation (CMS/HHS-HCC) 10/21/2022  . Diabetes mellitus type 2, uncomplicated (CMS/HHS-HCC)   . Hyperlipidemia   . Hypertension   . Sleep apnea    Uses CPAP  . Stroke (CMS/HHS-HCC)    TIA's  . Venous disease Legs    Past Surgical History:  Procedure Laterality Date  . Reduction and internal fixation of displaced comminuted subtrochanteic left hip fracture with biomet affyxis TFN nail  Left 02/04/2016   Dr.Poggi   . COLONOSCOPY  2003 & 2008   Dr. Jinny - no polyps      Social History   Socioeconomic History  . Marital status: Married  Tobacco Use  . Smoking status: Former    Current packs/day: 0.00    Types: Cigarettes    Quit date: 06/24/1975    Years since quitting: 48.6  . Smokeless tobacco: Never  . Tobacco comments:    Quit 40 years ago  Vaping Use  . Vaping status: Never Used  Substance and Sexual Activity  . Alcohol use: No  . Drug use: No  . Sexual activity: Not Currently    Partners: Female  Social History Narrative   Education: HS   Occupation: Retired   Presenter, broadcasting: golf   Marital Status: married   Social Drivers of Health   Food  Insecurity: No Food Insecurity (10/18/2022)   Received from [facility]   Hunger Vital Sign   . Within the past 12 months, you worried that your food would run out before you got the money to buy more.: Never true   . Within the past 12 months, the food you bought just didn't last and you didn't have money to get more.: Never true  Transportation Needs: No Transportation Needs (10/18/2022)   Received from [facility]   . Lack of Transportation (Medical): No   . Lack of Transportation (Non-Medical): No     Medication reviewed and signed on the nursing facility chart as appropriate   view all Weight: 131.7 Lbs 01/23/2024 13:04  pgillespie (Manual)  view all Blood Pressure: 116 /  545 mmHg 02/07/2024 19:42  /  MJah (Manual)  Diastolic High of 89 exceeded view all Temperature: 97.7 F 01/26/2024 06:49   Ewilhite (Manual)  view all Pulse: 74 bpm 02/07/2024 19:42   MJah (Manual)  view all Respirations: 16 Breaths/min 01/26/2024 06:49   Ewilhite (Manual)  view all Blood Sugar: 132.0 mg/dL 1/81/7974 94:69   MJah (Manual)  High of 99.0 exceeded view all O2 Saturation: 97.0% 01/26/2024 06:49   Ewilhite (Manual)  view all Height:  view all Pain Level: 5 02/07/2024 17:05  No acute distress No icterus  No JVD Lungs; clear to ascultation Heart; Regular rate and rhythm  Abdomen; Soft and flat, normal bowel sounds Extremities; right leg foreshortened and externally rotated     ASSESSMENT  AND PLAN: Diagnoses and all orders for this visit:  Closed fracture of right hip, initial encounter (CMS/HHS-HCC)    Dw pt, he wished er transport and ortho evaluation. I agree as though he is on hospice his death is certainly not imminent and I think he will be much more miserable without this being corrected. He is agreeable but want his poa called and nursing is planning that soon. Will need aggressive pain control if fracture correction cannot be accomplished.

## 2024-02-09 ENCOUNTER — Other Ambulatory Visit: Payer: Self-pay

## 2024-02-09 ENCOUNTER — Inpatient Hospital Stay: Admitting: General Practice

## 2024-02-09 ENCOUNTER — Encounter: Payer: Self-pay | Admitting: Hospitalist

## 2024-02-09 ENCOUNTER — Encounter
Admission: EM | Disposition: A | Discharge status: Skilled Nursing Facility | Payer: Self-pay | Source: Skilled Nursing Facility | Attending: Internal Medicine

## 2024-02-09 ENCOUNTER — Inpatient Hospital Stay

## 2024-02-09 DIAGNOSIS — S72001A Fracture of unspecified part of neck of right femur, initial encounter for closed fracture: Secondary | ICD-10-CM | POA: Diagnosis not present

## 2024-02-09 HISTORY — PX: INTRAMEDULLARY (IM) NAIL INTERTROCHANTERIC: SHX5875

## 2024-02-09 LAB — BASIC METABOLIC PANEL WITH GFR
Anion gap: 7 (ref 5–15)
BUN: 25 mg/dL — ABNORMAL HIGH (ref 8–23)
CO2: 28 mmol/L (ref 22–32)
Calcium: 8.9 mg/dL (ref 8.9–10.3)
Chloride: 101 mmol/L (ref 98–111)
Creatinine, Ser: 1 mg/dL (ref 0.61–1.24)
GFR, Estimated: >60 mL/min (ref >60)
Glucose, Bld: 125 mg/dL — ABNORMAL HIGH (ref 70–99)
Potassium: 3.9 mmol/L (ref 3.5–5.1)
Sodium: 136 mmol/L (ref 135–145)

## 2024-02-09 LAB — CBC
HCT: 38.8 % — ABNORMAL LOW (ref 39.0–52.0)
Hemoglobin: 12.7 g/dL — ABNORMAL LOW (ref 13.0–17.0)
MCH: 30.5 pg (ref 26.0–34.0)
MCHC: 32.7 g/dL (ref 30.0–36.0)
MCV: 93 fL (ref 80.0–100.0)
Platelets: 163 K/uL (ref 150–400)
RBC: 4.17 MIL/uL — ABNORMAL LOW (ref 4.22–5.81)
RDW: 15.5 % (ref 11.5–15.5)
WBC: 11.9 K/uL — ABNORMAL HIGH (ref 4.0–10.5)
nRBC: 0 % (ref 0.0–0.2)

## 2024-02-09 LAB — HEMOGLOBIN A1C
Hgb A1c MFr Bld: 5.6 % (ref 4.8–5.6)
Mean Plasma Glucose: 114.02 mg/dL

## 2024-02-09 LAB — GLUCOSE, CAPILLARY
Glucose-Capillary: 126 mg/dL — ABNORMAL HIGH (ref 70–99)
Glucose-Capillary: 124 mg/dL — ABNORMAL HIGH (ref 70–99)
Glucose-Capillary: 123 mg/dL — ABNORMAL HIGH (ref 70–99)
Glucose-Capillary: 144 mg/dL — ABNORMAL HIGH (ref 70–99)
Glucose-Capillary: 140 mg/dL — ABNORMAL HIGH (ref 70–99)
Glucose-Capillary: 213 mg/dL — ABNORMAL HIGH (ref 70–99)

## 2024-02-09 LAB — ABO/RH: ABO/RH(D): AB POS

## 2024-02-09 SURGERY — FIXATION, FRACTURE, INTERTROCHANTERIC, WITH INTRAMEDULLARY ROD
Anesthesia: General | Site: Hip | Laterality: Right

## 2024-02-09 MED ORDER — PROPOFOL 10 MG/ML IV BOLUS
INTRAVENOUS | Status: AC
  Filled 2024-02-09: qty 20

## 2024-02-09 MED ORDER — BUPIVACAINE LIPOSOME 1.3 % IJ SUSP: INTRAMUSCULAR | Status: DC | PRN

## 2024-02-09 MED ORDER — MIDAZOLAM HCL 2 MG/2ML IJ SOLN
INTRAMUSCULAR | Status: AC
  Filled 2024-02-09: qty 2

## 2024-02-09 MED ORDER — ACETAMINOPHEN 500 MG PO TABS
1000.0000 mg | ORAL_TABLET | Freq: Three times a day (TID) | ORAL | Status: DC
  Administered 2024-02-10 – 2024-02-11 (×4): 1000 mg via ORAL
  Filled 2024-02-09 (×4): qty 2

## 2024-02-09 MED ORDER — LIDOCAINE HCL (PF) 2 % IJ SOLN
INTRAMUSCULAR | Status: AC
  Filled 2024-02-09: qty 5

## 2024-02-09 MED ORDER — METOPROLOL TARTRATE 50 MG PO TABS
50.0000 mg | ORAL_TABLET | Freq: Two times a day (BID) | ORAL | Status: DC
  Administered 2024-02-10 – 2024-02-11 (×3): 50 mg via ORAL
  Filled 2024-02-09 (×3): qty 1

## 2024-02-09 MED ORDER — DEXAMETHASONE SODIUM PHOSPHATE 10 MG/ML IJ SOLN
INTRAMUSCULAR | Status: DC | PRN
  Administered 2024-02-09: 4 mg via INTRAVENOUS

## 2024-02-09 MED ORDER — ONDANSETRON HCL 4 MG/2ML IJ SOLN
INTRAMUSCULAR | Status: DC | PRN
Start: 2024-02-09 — End: 2024-02-09
  Administered 2024-02-09: 4 mg via INTRAVENOUS

## 2024-02-09 MED ORDER — ROCURONIUM BROMIDE 100 MG/10ML IV SOLN
INTRAVENOUS | Status: DC | PRN
  Administered 2024-02-09: 50 mg via INTRAVENOUS

## 2024-02-09 MED ORDER — TRANEXAMIC ACID 1000 MG/10ML IV SOLN
INTRAVENOUS | Status: DC | PRN
  Administered 2024-02-09: 1000 mg via INTRAVENOUS

## 2024-02-09 MED ORDER — BUPIVACAINE LIPOSOME 1.3 % IJ SUSP
INTRAMUSCULAR | Status: DC | PRN
  Administered 2024-02-09: 40 mL via INTRAMUSCULAR

## 2024-02-09 MED ORDER — METOCLOPRAMIDE HCL 5 MG/ML IJ SOLN: 5.0000 mg | Freq: Three times a day (TID) | INTRAMUSCULAR | Status: DC | PRN

## 2024-02-09 MED ORDER — HYDROMORPHONE HCL 1 MG/ML IJ SOLN: 0.2000 mg | INTRAMUSCULAR | Status: DC | PRN

## 2024-02-09 MED ORDER — CEFAZOLIN SODIUM-DEXTROSE 2-4 GM/100ML-% IV SOLN
2.0000 g | Freq: Four times a day (QID) | INTRAVENOUS | Status: AC
  Administered 2024-02-09 – 2024-02-10 (×3): 2 g via INTRAVENOUS
  Filled 2024-02-09 (×3): qty 100

## 2024-02-09 MED ORDER — ARTIFICIAL TEARS OPHTHALMIC OINT
TOPICAL_OINTMENT | OPHTHALMIC | Status: AC
  Filled 2024-02-09: qty 3.5

## 2024-02-09 MED ORDER — BUPIVACAINE HCL (PF) 0.5 % IJ SOLN
INTRAMUSCULAR | Status: AC
  Filled 2024-02-09: qty 30

## 2024-02-09 MED ORDER — ONDANSETRON HCL 4 MG/2ML IJ SOLN: 4.0000 mg | Freq: Four times a day (QID) | INTRAMUSCULAR | Status: DC | PRN

## 2024-02-09 MED ORDER — METOCLOPRAMIDE HCL 5 MG PO TABS: 5.0000 mg | ORAL_TABLET | Freq: Three times a day (TID) | ORAL | Status: DC | PRN

## 2024-02-09 MED ORDER — DEXAMETHASONE SODIUM PHOSPHATE 10 MG/ML IJ SOLN
INTRAMUSCULAR | Status: AC
  Filled 2024-02-09: qty 1

## 2024-02-09 MED ORDER — TRANEXAMIC ACID-NACL 1000-0.7 MG/100ML-% IV SOLN
INTRAVENOUS | Status: AC
  Filled 2024-02-09: qty 100

## 2024-02-09 MED ORDER — DOCUSATE SODIUM 100 MG PO CAPS
100.0000 mg | ORAL_CAPSULE | Freq: Two times a day (BID) | ORAL | Status: DC
  Administered 2024-02-10 – 2024-02-11 (×3): 100 mg via ORAL
  Filled 2024-02-09 (×3): qty 1

## 2024-02-09 MED ORDER — PHENYLEPHRINE 80 MCG/ML (10ML) SYRINGE FOR IV PUSH (FOR BLOOD PRESSURE SUPPORT)
PREFILLED_SYRINGE | INTRAVENOUS | Status: AC
Start: 2024-02-09 — End: 2024-02-09
  Filled 2024-02-09: qty 10

## 2024-02-09 MED ORDER — PHENYLEPHRINE 80 MCG/ML (10ML) SYRINGE FOR IV PUSH (FOR BLOOD PRESSURE SUPPORT)
PREFILLED_SYRINGE | INTRAVENOUS | Status: DC | PRN
  Administered 2024-02-09 (×4): 80 ug via INTRAVENOUS

## 2024-02-09 MED ORDER — SUGAMMADEX SODIUM 200 MG/2ML IV SOLN
INTRAVENOUS | Status: DC | PRN
  Administered 2024-02-09: 200 mg via INTRAVENOUS

## 2024-02-09 MED ORDER — FLEET ENEMA RE ENEM: 1.0000 | ENEMA | Freq: Once | RECTAL | Status: DC | PRN

## 2024-02-09 MED ORDER — FENTANYL CITRATE (PF) 100 MCG/2ML IJ SOLN
INTRAMUSCULAR | Status: AC
  Filled 2024-02-09: qty 2

## 2024-02-09 MED ORDER — ONDANSETRON HCL 4 MG/2ML IJ SOLN
INTRAMUSCULAR | Status: AC
  Filled 2024-02-09: qty 2

## 2024-02-09 MED ORDER — ENOXAPARIN SODIUM 40 MG/0.4ML IJ SOSY
40.0000 mg | PREFILLED_SYRINGE | INTRAMUSCULAR | Status: DC
  Administered 2024-02-10 – 2024-02-11 (×2): 40 mg via SUBCUTANEOUS
  Filled 2024-02-09 (×2): qty 0.4

## 2024-02-09 MED ORDER — FENTANYL CITRATE (PF) 100 MCG/2ML IJ SOLN
INTRAMUSCULAR | Status: DC | PRN
  Administered 2024-02-09: 25 ug via INTRAVENOUS
  Administered 2024-02-09: 50 ug via INTRAVENOUS

## 2024-02-09 MED ORDER — OXYCODONE HCL 5 MG PO TABS: 5.0000 mg | ORAL_TABLET | ORAL | Status: DC | PRN

## 2024-02-09 MED ORDER — LACTATED RINGERS IV SOLN: INTRAVENOUS | Status: DC | PRN

## 2024-02-09 MED ORDER — METHOCARBAMOL 1000 MG/10ML IJ SOLN
500.0000 mg | Freq: Four times a day (QID) | INTRAMUSCULAR | Status: DC | PRN
Start: 2024-02-09 — End: 2024-02-11

## 2024-02-09 MED ORDER — 0.9 % SODIUM CHLORIDE (POUR BTL) OPTIME
TOPICAL | Status: DC | PRN
Start: 2024-02-09 — End: 2024-02-09
  Administered 2024-02-09: 500 mL

## 2024-02-09 MED ORDER — OXYCODONE HCL 5 MG PO TABS: 2.5000 mg | ORAL_TABLET | ORAL | Status: DC | PRN

## 2024-02-09 MED ORDER — SENNOSIDES-DOCUSATE SODIUM 8.6-50 MG PO TABS: 1.0000 | ORAL_TABLET | Freq: Every evening | ORAL | Status: DC | PRN

## 2024-02-09 MED ORDER — ACETAMINOPHEN 10 MG/ML IV SOLN
INTRAVENOUS | Status: AC
  Filled 2024-02-09: qty 100

## 2024-02-09 MED ORDER — BISACODYL 10 MG RE SUPP: 10.0000 mg | Freq: Every day | RECTAL | Status: DC | PRN

## 2024-02-09 MED ORDER — LIDOCAINE HCL (CARDIAC) PF 100 MG/5ML IV SOSY
PREFILLED_SYRINGE | INTRAVENOUS | Status: DC | PRN
  Administered 2024-02-09: 50 mg via INTRAVENOUS

## 2024-02-09 MED ORDER — METHOCARBAMOL 500 MG PO TABS: 500.0000 mg | ORAL_TABLET | Freq: Four times a day (QID) | ORAL | Status: DC | PRN

## 2024-02-09 MED ORDER — METOPROLOL TARTRATE 5 MG/5ML IV SOLN
INTRAVENOUS | Status: DC | PRN
  Administered 2024-02-09: 1 mg via INTRAVENOUS

## 2024-02-09 MED ORDER — BUPIVACAINE LIPOSOME 1.3 % IJ SUSP
INTRAMUSCULAR | Status: AC
  Filled 2024-02-09: qty 20

## 2024-02-09 MED ORDER — SODIUM CHLORIDE 0.9 % IV SOLN: INTRAVENOUS | Status: DC

## 2024-02-09 MED ORDER — CEFAZOLIN SODIUM-DEXTROSE 2-4 GM/100ML-% IV SOLN
INTRAVENOUS | Status: AC
  Filled 2024-02-09: qty 100

## 2024-02-09 MED ORDER — KETOROLAC TROMETHAMINE 15 MG/ML IJ SOLN
7.5000 mg | Freq: Four times a day (QID) | INTRAMUSCULAR | Status: AC
  Administered 2024-02-09 – 2024-02-10 (×2): 7.5 mg via INTRAVENOUS
  Filled 2024-02-09 (×3): qty 1

## 2024-02-09 MED ORDER — TRAMADOL HCL 50 MG PO TABS: 50.0000 mg | ORAL_TABLET | Freq: Four times a day (QID) | ORAL | Status: DC | PRN

## 2024-02-09 MED ORDER — ONDANSETRON HCL 4 MG PO TABS: 4.0000 mg | ORAL_TABLET | Freq: Four times a day (QID) | ORAL | Status: DC | PRN

## 2024-02-09 MED ORDER — METOPROLOL TARTRATE 5 MG/5ML IV SOLN
INTRAVENOUS | Status: AC
  Filled 2024-02-09: qty 5

## 2024-02-09 MED ORDER — PROPOFOL 10 MG/ML IV BOLUS
INTRAVENOUS | Status: DC | PRN
  Administered 2024-02-09: 90 mg via INTRAVENOUS

## 2024-02-09 MED ORDER — ROCURONIUM BROMIDE 10 MG/ML (PF) SYRINGE
PREFILLED_SYRINGE | INTRAVENOUS | Status: AC
  Filled 2024-02-09: qty 10

## 2024-02-09 MED ORDER — ACETAMINOPHEN 10 MG/ML IV SOLN
INTRAVENOUS | Status: DC | PRN
  Administered 2024-02-09: 1000 mg via INTRAVENOUS

## 2024-02-09 SURGICAL SUPPLY — 31 items
BIT DRILL INTERTAN LAG SCREW (BIT) IMPLANT
BIT DRILL LONG 4.0 (BIT) IMPLANT
CHLORAPREP W/TINT 26 (MISCELLANEOUS) ×1 IMPLANT
DRAPE SHEET LG 3/4 BI-LAMINATE (DRAPES) ×1 IMPLANT
DRAPE U-SHAPE 47X51 STRL (DRAPES) ×2 IMPLANT
DRSG OPSITE POSTOP 3X4 (GAUZE/BANDAGES/DRESSINGS) ×1 IMPLANT
DRSG OPSITE POSTOP 4X6 (GAUZE/BANDAGES/DRESSINGS) ×1 IMPLANT
ELECTRODE REM PT RTRN 9FT ADLT (ELECTROSURGICAL) ×1 IMPLANT
GLOVE BIOGEL PI IND STRL 8 (GLOVE) ×1 IMPLANT
GLOVE SURG SYN 7.5 PF PI (GLOVE) ×1 IMPLANT
GOWN SRG LRG LVL 4 IMPRV REINF (GOWNS) ×1 IMPLANT
GOWN SRG XL LONG LVL 3 NONREIN (GOWNS) ×1 IMPLANT
KIT PATIENT CARE HANA TABLE (KITS) ×1 IMPLANT
KIT TURNOVER CYSTO (KITS) ×1 IMPLANT
MANIFOLD NEPTUNE II (INSTRUMENTS) ×1 IMPLANT
MAT ABSORB FLUID 56X50 GRAY (MISCELLANEOUS) ×2 IMPLANT
NAIL TRIGEN INTERTAN 10X18CM (Nail) IMPLANT
NDL HYPO 22X1.5 SAFETY MO (MISCELLANEOUS) ×1 IMPLANT
NEEDLE HYPO 22X1.5 SAFETY MO (MISCELLANEOUS) ×1 IMPLANT
NS IRRIG 500ML POUR BTL (IV SOLUTION) ×1 IMPLANT
PACK HIP COMPR (MISCELLANEOUS) ×1 IMPLANT
PENCIL SMOKE EVACUATOR (MISCELLANEOUS) ×1 IMPLANT
PIN GUIDE 3.2X343MM (PIN) IMPLANT
SCREW LAG COMPR KIT 100/95 (Screw) IMPLANT
SCREW TRIGEN LOW PROF 5.0X37.5 (Screw) IMPLANT
STAPLER SKIN PROX 35W (STAPLE) ×1 IMPLANT
SUT VIC AB 2-0 CT2 27 (SUTURE) ×1 IMPLANT
SYR 30ML LL (SYRINGE) ×1 IMPLANT
TAPE CLOTH 3X10 WHT NS LF (GAUZE/BANDAGES/DRESSINGS) ×2 IMPLANT
TRAP FLUID SMOKE EVACUATOR (MISCELLANEOUS) ×1 IMPLANT
WATER STERILE IRR 1000ML POUR (IV SOLUTION) ×1 IMPLANT

## 2024-02-09 NOTE — Anesthesia Preprocedure Evaluation (Signed)
 Anesthesia Evaluation  Patient identified by MRN, date of birth, ID band Patient awake    Reviewed: Allergy & Precautions, NPO status , Patient's Chart, lab work & pertinent test results, reviewed documented beta blocker date and time   Airway Mallampati: II  TM Distance: >3 FB Neck ROM: full    Dental  (+) Chipped   Pulmonary neg pulmonary ROS, sleep apnea , former smoker   Pulmonary exam normal        Cardiovascular hypertension, Pt. on medications negative cardio ROS + dysrhythmias Atrial Fibrillation      Neuro/Psych  PSYCHIATRIC DISORDERS      negative neurological ROS     GI/Hepatic negative GI ROS, Neg liver ROS,,,  Endo/Other  negative endocrine ROSdiabetes    Renal/GU      Musculoskeletal   Abdominal   Peds  Hematology negative hematology ROS (+)   Anesthesia Other Findings Past Medical History: No date: Arthritis No date: B12 deficiency No date: Bladder cancer (HCC)     Comment:  2 yrs ago 09/14/2013: Diabetes (HCC) No date: Dysrhythmia     Comment:  Bradycardia 02/04/2016: Hip fracture, left (HCC) No date: Hypertension No date: Non-insulin  dependent type 2 diabetes mellitus (HCC) No date: Sleep apnea  Past Surgical History: 1961: cyst on spine No date: EAR CYST EXCISION 02/04/2016: INTRAMEDULLARY (IM) NAIL INTERTROCHANTERIC; Left     Comment:  Procedure: INTRAMEDULLARY (IM) NAIL INTERTROCHANTRIC;                Surgeon: Norleen JINNY Maltos, MD;  Location: [facility] ORS;  Service:              Orthopedics;  Laterality: Left; 10/24/2014: TRANSURETHRAL RESECTION OF BLADDER TUMOR; N/A     Comment:  Procedure: TRANSURETHRAL RESECTION OF BLADDER TUMOR               (TURBT);  Surgeon: Ozell JONELLE Burkes, MD;  Location: [facility]               ORS;  Service: Urology;  Laterality: N/A;  BMI    Body Mass Index: 18.31 kg/m      Reproductive/Obstetrics negative OB ROS                               Anesthesia Physical Anesthesia Plan  ASA: 2  Anesthesia Plan: General ETT   Post-op Pain Management:    Induction: Intravenous  PONV Risk Score and Plan: 2 and Ondansetron  and Dexamethasone   Airway Management Planned: Oral ETT  Additional Equipment:   Intra-op Plan:   Post-operative Plan: Extubation in OR  Informed Consent: I have reviewed the patients History and Physical, chart, labs and discussed the procedure including the risks, benefits and alternatives for the proposed anesthesia with the patient or authorized representative who has indicated his/her understanding and acceptance.     Dental Advisory Given  Plan Discussed with: Anesthesiologist, CRNA and Surgeon  Anesthesia Plan Comments: (Patient consented for risks of anesthesia including but not limited to:  - adverse reactions to medications - damage to eyes, teeth, lips or other oral mucosa - nerve damage due to positioning  - sore throat or hoarseness - Damage to heart, brain, nerves, lungs, other parts of body or loss of life  Patient voiced understanding and assent.)        Anesthesia Quick Evaluation

## 2024-02-09 NOTE — Anesthesia Procedure Notes (Signed)
 Procedure Name: Intubation Date/Time: 02/09/2024 12:25 PM  Performed by: Trudy Rankin LABOR, CRNAPre-anesthesia Checklist: Patient identified, Emergency Drugs available, Suction available and Patient being monitored Patient Re-evaluated:Patient Re-evaluated prior to induction Oxygen Delivery Method: Circle system utilized Preoxygenation: Pre-oxygenation with 100% oxygen Induction Type: IV induction Ventilation: Mask ventilation without difficulty Laryngoscope Size: McGrath and 3 Grade View: Grade I Tube type: Oral Tube size: 7.5 mm Number of attempts: 1 Airway Equipment and Method: Stylet, Oral airway and Patient positioned with wedge pillow Placement Confirmation: ETT inserted through vocal cords under direct vision, positive ETCO2 and breath sounds checked- equal and bilateral Secured at: 21 cm Tube secured with: Tape Dental Injury: Teeth and Oropharynx as per pre-operative assessment

## 2024-02-09 NOTE — Progress Notes (Signed)
 [facility] Room PACU 04 - [facility] Patient Visit  Herbert Phillips is a current hospice patient with terminal diagnosis of cerebrovascular disease. Patient receives hospice services while residing at [facility].  Patient was brought to [facility] ED via [facility] following a fall that occurred in facility that patient resides in on 8.16.  Patient began having pain in right hip and was unable to ambulate with his walker.  Upon assessment in [facility] ED, patient was found to have Right hip FX.  Patient has elected to undergo surgery on right hip.  Patient did undergo surgery today and is currently in PACU recovering.  Spoke with patients daughter, Herbert Phillips who reports patient is recovering well and is expected to remain hospitalized for the next couple of days.  Herbert Phillips is aware that hospital liaison team will continue to follow patient while hospitalized and was provided contact information should she have any questions.   Vital Signs- BP: 129/69 HR: 90 RR: 17 T:97.6 Spo2: 94% 3L o2 via [place] I&O- NPO/output 800 Abnormal Labs-  WBC 11.9 RBC 4.17 Hemoglobin 12.7 BUN 25 GFR 59  Diagnostics-  DG HIP (WITH OR WITHOUT PELVIS) 2-3V RIGHT  IMPRESSION: 1. Comminuted intertrochanteric right hip fracture, with mild varus angulation at the fracture site. IV/PRN Meds- ceFAZolin  (ANCEF ) IVPB 2g/100 mL IV x 3 doses, morphine  (PF) 2 MG/ML injection 2 mg x 1 dose Current plan as per Dr Earnestine Blanch, MD Herbert Phillips is a 89 y.o. male with a R intertrochanteric hip fracture   1. I discussed the various treatment options including both surgical and non-surgical management of the fracture with the patient and/or family (medical PoA). We discussed the high risk of perioperative complications due to patient's age, dementia, and other co-morbidities. After discussion of risks, benefits, and alternatives to surgery, the family and/or patient were in agreement to proceed with surgery.  The goals of surgery would be to provide adequate pain relief and allow for attempted mobilization. Plan for surgery is R hip cephalomedullary nailing today, 02/09/2024. 2. NPO until OR 3. Hold anticoagulation in advance of OR    Discharge Planning- no discharge plans at this time  Family Contact- Daughter, Herbert Phillips [phone] IDT: updated Goals of Care: DNR-Limited Transfer summary and Med list sent to be placed under Media tab. Should patient require EMS transport at discharge please reach out to [facility] as we contract with them for this service. Thank you for the opportunity to participate in this patient's care, please don't hesitate to call for any hospice related questions or concerns.  Daphne Shed, LPN [facility] Liaison [phone]

## 2024-02-09 NOTE — Plan of Care (Signed)

## 2024-02-09 NOTE — H&P (Signed)
 H&P reviewed. No significant changes noted.

## 2024-02-09 NOTE — Progress Notes (Signed)
 Progress Note    Herbert Phillips  FMW:[id number] DOB: 07-Apr-1926  DOA: 02/08/2024 PCP: Lenon Layman ORN, MD      Brief Narrative:    Medical records reviewed and are as summarized below:  Herbert Phillips is a 89 y.o. male with medical history significant for type II DM, bladder cancer, hypertension, sleep apnea, left hip fracture, history of stroke, paroxysmal atrial fibrillation, hypertension, intramedullary nail intertrochanteric left hip in 2017, transurethral resection of bladder tumor in 2016, on hospice care, was brought from [facility] facility because of right hip fracture.  Reportedly, patient had a fall when he tripped while leaving the facility where he lives about 2 days prior to admission.  An x-ray at the facility revealed right hip fracture.   He was brought to the ED for further management.  X-ray in the ED showed comminuted intertrochanteric right hip fracture   Assessment/Plan:   Principal Problem:   Hip fracture (HCC) Active Problems:   Paroxysmal atrial fibrillation with RVR (HCC)   Type 2 diabetes mellitus with hyperlipidemia (HCC)   HTN (hypertension)   Dyslipidemia   Sleep apnea    Body mass index is 18.31 kg/m.   Right intertrochanteric hip fracture: S/p Intramedullary nailing of right femur with cephalomedullary device on 02/09/2024.  Analgesics as needed for pain.  PT and OT evaluation.  Follow-up with orthopedic surgeon.   Hypotension: Hold metoprolol .  Continue IV fluids for hydration.   Leukocytosis: Improving. Thrombocytopenia: Improved   Paroxysmal atrial fibrillation: Hold metoprolol .    Type II DM: NovoLog  as needed for hyperglycemia    History of stroke: He was on aspirin  prior to admission   Patient is on hospice and is followed by the hospice team at [facility].   Diet Order             Diet regular Room service appropriate? Yes; Fluid consistency: Thin  Diet effective  now                                  Consultants: Orthopedic surgeon  Procedures: Intramedullary nailing of right femur with cephalomedullary device on 02/09/2024    Medications:    acetaminophen   1,000 mg Oral Q8H   docusate sodium   100 mg Oral BID   [START ON 02/10/2024] enoxaparin  (LOVENOX ) injection  40 mg Subcutaneous Q24H   insulin  aspart  0-5 Units Subcutaneous QHS   insulin  aspart  0-9 Units Subcutaneous TID WC   ketorolac   7.5 mg Intravenous Q6H   metoprolol  tartrate  50 mg Oral BID   traZODone   50 mg Oral QHS   Continuous Infusions:  sodium chloride       ceFAZolin  (ANCEF ) IV       Anti-infectives (From admission, onward)    Start     Dose/Rate Route Frequency Ordered Stop   02/09/24 1830  ceFAZolin  (ANCEF ) IVPB 2g/100 mL premix        2 g 200 mL/hr over 30 Minutes Intravenous Every 6 hours 02/09/24 1500 02/10/24 1229   02/09/24 1100  ceFAZolin  (ANCEF ) IVPB 2g/100 mL premix        2 g 200 mL/hr over 30 Minutes Intravenous On call to O.R. 02/08/24 2235 02/09/24 1228              Family Communication/Anticipated D/C date and plan/Code Status   DVT prophylaxis: enoxaparin  (LOVENOX ) injection 40 mg Start: 02/10/24 0800  SCDs Start: 02/09/24 1501     Code Status: Limited: Do not attempt resuscitation (DNR) -DNR-LIMITED -Do Not Intubate/DNI   Family Communication: Plan discussed with Jama (daughter-in-law) and [name] (grandson) at bedside Disposition Plan: Plan to discharge to SNF   Status is: Inpatient Remains inpatient appropriate because: Right hip fracture s/p repair       Subjective:   Interval events noted.  No complaints.  No pain.  He is comfortable.  Family [name], daughter-in-law and [name], grandson) at the bedside  Objective:    Vitals:   02/09/24 1400 02/09/24 1415 02/09/24 1430 02/09/24 1508  BP: 91/62 (!) 92/58 (!) 92/58 (!) 86/59  Pulse: (!) 117  (!) 113 (!) 104  Resp: (!) 24  (!) 23   Temp:    (!)  97.4 F (36.3 C)  TempSrc:    Axillary  SpO2: 95%  95% 93%  Weight:      Height:       No data found.   Intake/Output Summary (Last 24 hours) at 02/09/2024 1533 Last data filed at 02/09/2024 1328 Gross per 24 hour  Intake 900 ml  Output 100 ml  Net 800 ml   Filed Weights   02/08/24 1501 02/09/24 0957  Weight: 61.2 kg 61.2 kg    Exam:  GEN: NAD SKIN: Warm and dry EYES: No pallor or icterus ENT: MMM CV: RRR PULM: CTA B ABD: soft, ND, NT, +BS CNS: AAO x 3, non focal EXT: Honeycomb bandage on right hip surgical wound incision is clean, dry and intact        Data Reviewed:   I have personally reviewed following labs and imaging studies:  Labs: Labs show the following:   Basic Metabolic Panel: Recent Labs  Lab 02/08/24 1525 02/08/24 2103 02/09/24 0438  NA 137  --  136  K 4.2  --  3.9  CL 101  --  101  CO2 24  --  28  GLUCOSE 142*  --  125*  BUN 32*  --  25*  CREATININE 1.15 1.01 1.00  CALCIUM 8.5*  --  8.9   GFR Estimated Creatinine Clearance: 38.3 mL/min (by C-G formula based on SCr of 1 mg/dL). Liver Function Tests: No results for input(s): AST, ALT, ALKPHOS, BILITOT, PROT, ALBUMIN in the last 168 hours. No results for input(s): LIPASE, AMYLASE in the last 168 hours. No results for input(s): AMMONIA in the last 168 hours. Coagulation profile Recent Labs  Lab 02/08/24 1524  INR 1.1    CBC: Recent Labs  Lab 02/08/24 1525 02/08/24 2103 02/09/24 0438  WBC 12.7* 14.1* 11.9*  NEUTROABS 8.4*  --   --   HGB 10.9* 12.5* 12.7*  HCT 34.3* 37.6* 38.8*  MCV 95.0 92.4 93.0  PLT 142* 148* 163   Cardiac Enzymes: No results for input(s): CKTOTAL, CKMB, CKMBINDEX, TROPONINI in the last 168 hours. BNP (last 3 results) No results for input(s): PROBNP in the last 8760 hours. CBG: Recent Labs  Lab 02/08/24 2153 02/09/24 0754 02/09/24 0958 02/09/24 1353 02/09/24 1513  GLUCAP 123* 126* 124* 123* 144*   D-Dimer: No  results for input(s): DDIMER in the last 72 hours. Hgb A1c: Recent Labs    02/08/24 2103  HGBA1C 5.6   Lipid Profile: No results for input(s): CHOL, HDL, LDLCALC, TRIG, CHOLHDL, LDLDIRECT in the last 72 hours. Thyroid function studies: No results for input(s): TSH, T4TOTAL, T3FREE, THYROIDAB in the last 72 hours.  Invalid input(s): FREET3 Anemia work up: No results for input(s):  VITAMINB12, FOLATE, FERRITIN, TIBC, IRON, RETICCTPCT in the last 72 hours. Sepsis Labs: Recent Labs  Lab 02/08/24 1525 02/08/24 2103 02/09/24 0438  WBC 12.7* 14.1* 11.9*    Microbiology No results found for this or any previous visit (from the past 240 hours).  Procedures and diagnostic studies:  DG C-Arm 1-60 Min-No Report Result Date: 02/09/2024 Fluoroscopy was utilized by the requesting physician.  No radiographic interpretation.   DG Hip Unilat  With Pelvis 2-3 Views Right Result Date: 02/08/2024 CLINICAL DATA:  Clemens, right hip pain, deformity EXAM: DG HIP (WITH OR WITHOUT PELVIS) 2-3V RIGHT COMPARISON:  None Available. FINDINGS: Frontal view of the pelvis as well as frontal and frogleg lateral views of the right hip are obtained. There is a comminuted intertrochanteric right hip fracture with mild varus angulation at the fracture site. No dislocation. Prior left hip ORIF spanning a healed proximal left femoral fracture. The remainder of the visualized bony pelvis is unremarkable. The bilateral iliac crests are excluded by collimation. IMPRESSION: 1. Comminuted intertrochanteric right hip fracture, with mild varus angulation at the fracture site. Electronically Signed   By: Ozell Daring M.D.   On: 02/08/2024 15:59               LOS: 1 day   Keimya Briddell  [facility]   Pager on [email or web address]. If 7PM-7AM, please contact night-coverage at [email or web address]     02/09/2024, 3:33 PM

## 2024-02-09 NOTE — Op Note (Signed)
 DATE OF SURGERY: 02/09/2024  PREOPERATIVE DIAGNOSIS: Right intertrochanteric hip fracture  POSTOPERATIVE DIAGNOSIS: Right intertrochanteric hip fracture  PROCEDURE: Intramedullary nailing of Right femur with cephalomedullary device  SURGEON: Earnestine HILARIO Blanch, MD  ANESTHESIA: Gen  EBL: 100 cc  IVF: per anesthesia record  COMPONENTS:  [employer] Trigen Intertan Short Nail: 10x157mm; lag screw with 95mm compression screw; 5x 37.59mm distal cortical interlocking screw  INDICATIONS: Herbert Phillips is a 89 y.o. male who sustained an intertrochanteric fracture after a fall. Risks and benefits of intramedullary nailing were explained to the patient and/or family . Risks include but are not limited to bleeding, infection, injury to tissues, nerves, vessels, nonunion/malunion, hardware failure, limb length discrepancy/hip rotation mismatch and risks of anesthesia. The patient and/or family understand these risks, have completed an informed consent, and wish to proceed.   PROCEDURE:  The patient was brought into the operating room. After administering anesthesia, the patient was placed in the supine position on the Hana table. The uninjured leg was placed in an extended position while the injured lower extremity was placed in longitudinal traction. The fracture was reduced using longitudinal traction and internal rotation. The adequacy of reduction was verified fluoroscopically in AP and lateral projections and found to be acceptable. The lateral aspect of the hip and thigh were prepped with ChloraPrep solution before being draped sterilely. Preoperative IV antibiotics were administered. A timeout was performed to verify the appropriate surgical site, patient, and procedure.    The greater trochanter was identified and an approximately 6 cm incision was made about 3 fingerbreadths above the tip of the greater trochanter. The incision was carried down through the subcutaneous tissues to  expose the gluteal fascia. This was split the length of the incision, providing access to the tip of the trochanter. Under fluoroscopic guidance, a guidewire was drilled through the tip of the trochanter into the proximal metaphysis to the level of the lesser trochanter. After verifying its position fluoroscopically in AP and lateral projections, it was overreamed with the opening reamer to the level of the lesser trochanter. The nail was selected and advanced to the appropriate depth as verified fluoroscopically.    The guide system for the lag screw was positioned and advanced through an approximately 5cm incision over the lateral aspect of the proximal femur. The guidewire was drilled up through the femoral nail and into the femoral neck to rest within 5 mm of subchondral bone. After verifying its position in the femoral neck and head in both AP and lateral projections, the guidewire was measured and appropriate sized lag screw was selected.  The channel for the compression screw was drilled and antirotation bar was placed.  Lag screw was drilled and placed in appropriate position.  Compression screw was then placed.  Appropriate compression was achieved.  The set screw was locked in place. Again, the adequacy of hardware position and fracture reduction was verified fluoroscopically in AP and lateral projections.   Attention was then turned to the distal interlocking screw in the diaphysis. Using a targeted assembly, a stab incision was made and hole was drilled through the nail. An interlocking screw was placed with excellent purchase.  Appropriate screw position was verified fluoroscopically in AP and lateral projections.   The wounds were irrigated thoroughly with sterile saline solution. Local anesthetic was injected into the wounds. Deep fascia was closed with 0-Vicryl. The subcutaneous tissues were closed using 2-0 Vicryl interrupted sutures. The skin was closed using staples. Sterile occlusive  dressings  were applied to all wounds. The patient was then transferred to the recovery room in satisfactory condition.   POSTOPERATIVE PLAN: The patient will be WBAT on the operative extremity. Lovenox  40mg /day x 4 weeks to start on POD#1. Perioperative IV antibiotics x 24 hours. PT/OT on POD#1.

## 2024-02-09 NOTE — Transfer of Care (Signed)
 Immediate Anesthesia Transfer of Care Note  Patient: Herbert Phillips  Procedure(s) Performed: Right hip cephalomedullary nailing (Right: Hip)  Patient Location: PACU  Anesthesia Type:General  Level of Consciousness: awake, drowsy, and patient cooperative  Airway & Oxygen Therapy: Patient Spontanous Breathing  Post-op Assessment: Report given to RN and Post -op Vital signs reviewed and stable  Post vital signs: Reviewed and stable  Last Vitals:  Vitals Value Taken Time  BP 118/101 02/09/24 13:46  Temp 98.5 C 1346  Pulse 115 02/09/24 13:49  Resp 13 02/09/24 13:49  SpO2 93 % 02/09/24 13:49  Vitals shown include unfiled device data.  Last Pain:  Vitals:   02/09/24 0200  TempSrc:   PainSc: 0-No pain         Complications: No notable events documented.

## 2024-02-09 NOTE — Consult Note (Signed)
 ORTHOPAEDIC CONSULTATION  REQUESTING PHYSICIAN: Jens Durand, MD  Chief Complaint:   R hip pain  History of Present Illness: History and from patient, patient's daughter-in-law at the bedside and over the phone, and review of medical records.  Herbert Phillips is a 89 y.o. male with a medical history significant for a history of bladder cancer now in remission with sequelae of incontinence, diabetes, hypertension, paroxysmal atrial fibrillation, and dementia who had a fall at his skilled nursing facility approximately 3 days ago.  The patient noted immediate hip pain and inability to ambulate.  The patient ambulates short distances with a walker at baseline.  The patient is on palliative care.  Pain is worse with any sort of movement.  X-rays in the emergency department show a right intertrochanteric hip fracture.  Of note, he has had a prior left hip IM nail in 2017 by Dr. Edie.  Per the patient's daughter, patient does have a history of postoperative delirium.  Past Medical History:  Diagnosis Date   Arthritis    B12 deficiency    Bladder cancer (HCC)    2 yrs ago   Diabetes (HCC) 09/14/2013   Dysrhythmia    Bradycardia   Hip fracture, left (HCC) 02/04/2016   Hypertension    Non-insulin  dependent type 2 diabetes mellitus (HCC)    Sleep apnea    Past Surgical History:  Procedure Laterality Date   cyst on spine  1961   EAR CYST EXCISION     INTRAMEDULLARY (IM) NAIL INTERTROCHANTERIC Left 02/04/2016   Procedure: INTRAMEDULLARY (IM) NAIL INTERTROCHANTRIC;  Surgeon: Norleen JINNY Edie, MD;  Location: [facility] ORS;  Service: Orthopedics;  Laterality: Left;   TRANSURETHRAL RESECTION OF BLADDER TUMOR N/A 10/24/2014   Procedure: TRANSURETHRAL RESECTION OF BLADDER TUMOR (TURBT);  Surgeon: Ozell JONELLE Burkes, MD;  Location: [facility] ORS;  Service: Urology;  Laterality: N/A;   Social History   Socioeconomic History   Marital status:  Widowed    Spouse name: Not on file   Number of children: Not on file   Years of education: Not on file   Highest education level: Not on file  Occupational History   Not on file  Tobacco Use   Smoking status: Former    Current packs/day: 0.00    Types: Cigarettes    Quit date: 06/13/1976    Years since quitting: 47.6   Smokeless tobacco: Never  Substance and Sexual Activity   Alcohol use: Yes    Comment: monthly 3 beers since xmas   Drug use: No   Sexual activity: Not on file  Other Topics Concern   Not on file  Social History Narrative   Not on file   Social Drivers of Health   Financial Resource Strain: Not on file  Food Insecurity: No Food Insecurity (02/09/2024)   Hunger Vital Sign    Worried About Running Out of Food in the Last Year: Never true    Ran Out of Food in the Last Year: Never true  Transportation Needs: No Transportation Needs (02/09/2024)   PRAPARE - Administrator, Civil Service (Medical): No    Lack of Transportation (Non-Medical): No  Physical Activity: Not on file  Stress: Not on file  Social Connections: Moderately Isolated (02/09/2024)   Social Connection and Isolation Panel    Frequency of Communication with Friends and Family: Once a week    Frequency of Social Gatherings with Friends and Family: Twice a week    Attends Religious Services: More than  4 times per year    Active Member of Clubs or Organizations: No    Attends Banker Meetings: Never    Marital Status: Widowed   Family History  Problem Relation Age of Onset   Diabetes Neg Hx    No Known Allergies Prior to Admission medications   Medication Sig Start Date End Date Taking? Authorizing Provider  aspirin  EC 81 MG tablet Take 81 mg by mouth daily.    Yes [provider]  bacitracin  ophthalmic ointment Place 1 Application into the left eye. 03/16/23  Yes [provider]  cholecalciferol (VITAMIN D3) 25 MCG (1000 UNIT) tablet Take 1,000 Units  by mouth daily.   Yes [provider]  Cod Liver Oil 1000 MG CAPS Take 1 capsule by mouth daily.   Yes [provider]  Dextran 70-Hypromellose, PF, (ARTIFICIAL TEARS PF) 0.1-0.3 % SOLN Place 1 drop into both eyes 2 (two) times daily.   Yes [provider]  docusate sodium  (COLACE) 100 MG capsule Take 1 capsule (100 mg total) by mouth 2 (two) times daily. 02/07/16  Yes Hower, Alm POUR, MD  furosemide  (LASIX ) 20 MG tablet Take 20 mg by mouth as needed.  04/02/16  Yes [provider]  Glucosamine Sulfate (GLUCOSAMINE RELIEF) 1000 MG TABS Take 1,000 mg by mouth daily.    Yes [provider]  ipratropium (ATROVENT) 0.03 % nasal spray Place 2 sprays into both nostrils 3 (three) times daily. 12/23/23  Yes [provider]  ipratropium-albuterol  (DUONEB) 0.5-2.5 (3) MG/3ML SOLN Inhale 3 mLs into the lungs as needed (for wheezing or shortness of breath). 11/18/23  Yes [provider]  LORazepam (ATIVAN) 0.5 MG tablet Take 0.5 mg by mouth every 12 (twelve) hours as needed for anxiety. 12/07/23 06/04/24 Yes [provider]  lovastatin (MEVACOR) 20 MG tablet Take 20 mg by mouth daily at 6 PM.  12/28/14  Yes [provider]  magnesium  hydroxide (MILK OF MAGNESIA) 400 MG/5ML suspension Take 30 mLs by mouth every 4 (four) hours as needed for mild constipation.   Yes [provider]  metFORMIN (GLUCOPHAGE) 500 MG tablet Take 500 mg by mouth daily. Take with a meal. 12/13/14  Yes [provider]  metoprolol  tartrate (LOPRESSOR ) 50 MG tablet Take 1 tablet (50 mg total) by mouth 2 (two) times daily. 10/20/22  Yes Wieting, Richard, MD  Polyvinyl Alcohol-Povidone PF (REFRESH) 1.4-0.6 % SOLN Place 1 drop into both eyes 2 (two) times daily as needed (dry eyes).   Yes [provider]  traZODone  (DESYREL ) 50 MG tablet Take 50 mg by mouth at bedtime.   Yes [provider]  White Petrolatum -Mineral Oil (SYSTANE NIGHTTIME)  OINT Apply to both eyes at bedtime  as needed for eye irritation.   Yes [provider]  acetaminophen  (TYLENOL ) 325 MG tablet Take 650 mg by mouth every 4 (four) hours as needed for mild pain, moderate pain or fever. Patient not taking: Reported on 02/08/2024    [provider]  allopurinol  (ZYLOPRIM ) 100 MG tablet Take 100 mg by mouth daily. Patient not taking: Reported on 02/08/2024 01/13/18   [provider]  FREESTYLE LITE test strip USE 1 STRIP TO CHECK GLUCOSE TWICE DAILY 01/04/18   [provider]  Glucerna (GLUCERNA) LIQD Take 237 mLs by mouth.    [provider]  Multiple Vitamins-Minerals (PRESERVISION AREDS 2) CAPS Take by mouth. Take one capsule by mouth twice daily for eye health. Patient not taking: Reported on  02/08/2024    [provider]  NON FORMULARY CPAP    [provider]  Omega 3 1000 MG CAPS Take 3,000 mg by mouth 2 (two) times daily. Patient not taking: Reported on 02/08/2024    [provider]  Probiotic Product (RISA-BID PROBIOTIC PO) Take by mouth. Patient not taking: Reported on 02/08/2024    [provider]  Psyllium (NATURAL FIBER LAXATIVE PO) Take by mouth. Patient not taking: Reported on 02/08/2024    [provider]  vitamin B-12 (CYANOCOBALAMIN ) 1000 MCG tablet Take 1,000 mcg by mouth daily. Patient not taking: Reported on 02/08/2024    [provider]   Recent Labs    02/08/24 1524 02/08/24 1525 02/08/24 1525 02/08/24 2103 02/09/24 0438  WBC  --  12.7*  --  14.1* 11.9*  HGB  --  10.9*  --  12.5* 12.7*  HCT  --  34.3*  --  37.6* 38.8*  PLT  --  142*  --  148* 163  K  --  4.2  --   --  3.9  CL  --  101  --   --  101  CO2  --  24  --   --  28  BUN  --  32*  --   --  25*  CREATININE  --  1.15   < > 1.01 1.00  GLUCOSE  --  142*  --   --  125*  CALCIUM  --  8.5*  --   --  8.9  INR 1.1  --   --   --   --    < > = values in this interval not displayed.   DG Hip  Unilat  With Pelvis 2-3 Views Right Result Date: 02/08/2024 CLINICAL DATA:  Clemens, right hip pain, deformity EXAM: DG HIP (WITH OR WITHOUT PELVIS) 2-3V RIGHT COMPARISON:  None Available. FINDINGS: Frontal view of the pelvis as well as frontal and frogleg lateral views of the right hip are obtained. There is a comminuted intertrochanteric right hip fracture with mild varus angulation at the fracture site. No dislocation. Prior left hip ORIF spanning a healed proximal left femoral fracture. The remainder of the visualized bony pelvis is unremarkable. The bilateral iliac crests are excluded by collimation. IMPRESSION: 1. Comminuted intertrochanteric right hip fracture, with mild varus angulation at the fracture site. Electronically Signed   By: Ozell Daring M.D.   On: 02/08/2024 15:59     Positive ROS: All other systems have been reviewed and were otherwise negative with the exception of those mentioned in the HPI and as above.  Physical Exam: BP 128/72 (BP Location: Left Arm)   Pulse 79   Temp (!) 97.5 F (36.4 C)   Resp 18   Ht 6' (1.829 m)   Wt 61.2 kg   SpO2 96%   BMI 18.31 kg/m  General:  Alert, no acute distress.  Oriented to name location, but not year Psychiatric:  Patient is not competent for consent, responds appropriately to questions and follows commands.  Orthopedic Exam:  RLE: 5/5 DF/PF/EHL SILT grossly over foot Foot wwp +Log roll/axial load   Imaging:  As above: R intertrochanteric hip fracture  Assessment/Plan: Yaxiel Minnie is a 89 y.o. male with a R intertrochanteric hip fracture   1. I discussed the various treatment options including both surgical and non-surgical management of the fracture with the patient and/or family (medical PoA). We discussed the high risk of perioperative complications due to patient's  age, dementia, and other co-morbidities. After discussion of risks, benefits, and alternatives to surgery, the family and/or patient were in  agreement to proceed with surgery. The goals of surgery would be to provide adequate pain relief and allow for attempted mobilization. Plan for surgery is R hip cephalomedullary nailing today, 02/09/2024. 2. NPO until OR 3. Hold anticoagulation in advance of OR  Earnestine Blanch   02/09/2024 9:43 AM

## 2024-02-09 NOTE — Plan of Care (Signed)
  Problem: Education: Goal: Ability to describe self-care measures that may prevent or decrease complications (Diabetes Survival Skills Education) will improve Outcome: Progressing Goal: Individualized Educational Video(s) Outcome: Progressing   Problem: Coping: Goal: Ability to adjust to condition or change in health will improve Outcome: Progressing   Problem: Nutritional: Goal: Maintenance of adequate nutrition will improve Outcome: Progressing Goal: Progress toward achieving an optimal weight will improve Outcome: Progressing   Problem: Skin Integrity: Goal: Risk for impaired skin integrity will decrease Outcome: Progressing

## 2024-02-10 ENCOUNTER — Encounter: Payer: Self-pay | Admitting: Orthopedic Surgery

## 2024-02-10 DIAGNOSIS — E1169 Type 2 diabetes mellitus with other specified complication: Secondary | ICD-10-CM

## 2024-02-10 DIAGNOSIS — S72001A Fracture of unspecified part of neck of right femur, initial encounter for closed fracture: Secondary | ICD-10-CM | POA: Diagnosis not present

## 2024-02-10 DIAGNOSIS — I480 Paroxysmal atrial fibrillation: Secondary | ICD-10-CM | POA: Diagnosis not present

## 2024-02-10 DIAGNOSIS — I10 Essential (primary) hypertension: Secondary | ICD-10-CM | POA: Diagnosis not present

## 2024-02-10 DIAGNOSIS — E785 Hyperlipidemia, unspecified: Secondary | ICD-10-CM

## 2024-02-10 DIAGNOSIS — G4730 Sleep apnea, unspecified: Secondary | ICD-10-CM

## 2024-02-10 LAB — CBC
HCT: 36.2 % — ABNORMAL LOW (ref 39.0–52.0)
Hemoglobin: 11.7 g/dL — ABNORMAL LOW (ref 13.0–17.0)
MCH: 31.1 pg (ref 26.0–34.0)
MCHC: 32.3 g/dL (ref 30.0–36.0)
MCV: 96.3 fL (ref 80.0–100.0)
Platelets: 197 K/uL (ref 150–400)
RBC: 3.76 MIL/uL — ABNORMAL LOW (ref 4.22–5.81)
RDW: 15.7 % — ABNORMAL HIGH (ref 11.5–15.5)
WBC: 14.1 K/uL — ABNORMAL HIGH (ref 4.0–10.5)
nRBC: 0 % (ref 0.0–0.2)

## 2024-02-10 LAB — BASIC METABOLIC PANEL WITH GFR
Anion gap: 7 (ref 5–15)
BUN: 38 mg/dL — ABNORMAL HIGH (ref 8–23)
CO2: 25 mmol/L (ref 22–32)
Calcium: 8.2 mg/dL — ABNORMAL LOW (ref 8.9–10.3)
Chloride: 105 mmol/L (ref 98–111)
Creatinine, Ser: 1.22 mg/dL (ref 0.61–1.24)
GFR, Estimated: 55 mL/min — ABNORMAL LOW (ref >60)
Glucose, Bld: 124 mg/dL — ABNORMAL HIGH (ref 70–99)
Potassium: 4.4 mmol/L (ref 3.5–5.1)
Sodium: 137 mmol/L (ref 135–145)

## 2024-02-10 LAB — GLUCOSE, CAPILLARY
Glucose-Capillary: 127 mg/dL — ABNORMAL HIGH (ref 70–99)
Glucose-Capillary: 124 mg/dL — ABNORMAL HIGH (ref 70–99)

## 2024-02-10 MED ORDER — ENOXAPARIN SODIUM 40 MG/0.4ML IJ SOSY: 40.0000 mg | PREFILLED_SYRINGE | INTRAMUSCULAR | 0 refills | Status: AC

## 2024-02-10 MED ORDER — TRAMADOL HCL 50 MG PO TABS: 50.0000 mg | ORAL_TABLET | Freq: Four times a day (QID) | ORAL | 0 refills | Status: DC | PRN

## 2024-02-10 MED ORDER — OXYCODONE HCL 5 MG PO TABS: 2.5000 mg | ORAL_TABLET | ORAL | 0 refills | Status: DC | PRN

## 2024-02-10 MED ORDER — FE FUM-VIT C-VIT B12-FA 460-60-0.01-1 MG PO CAPS
1.0000 | ORAL_CAPSULE | Freq: Every day | ORAL | Status: DC
  Administered 2024-02-10 – 2024-02-11 (×2): 1 via ORAL
  Filled 2024-02-10 (×2): qty 1

## 2024-02-10 NOTE — TOC Transition Note (Signed)
 Transition of Care [facility]) - Discharge Note   Patient Details  Name: Herbert Phillips MRN: [id number] Date of Birth: [birth year]  Transition of Care [facility]) CM/SW Contact:  Marinda Cooks, RN Phone Number: 02/10/2024, 4:44 PM   Clinical Narrative:    This CM  spoke with pt 's (HCPOA/POA) daughter in law Jama at [phone] introduced role and completed Initial assessement. Pt admitted from the [facility] [place] spoke with admission liaison Suzen and can confirmed that pt is a  resident and can be rec'd  when medically cleared. Pt has family support provided by daughter in law Jama,  [facility]  at facility & PCP is Layman Piety who is the assigned PCP at [facility] . TOC will cont to follow dc planning / care coordination and update as applicable.   16:35 This CM alerted by covering MD  pt may be medically cleared tomm after surgery for right hip. TOC will cont to follow dc planning / care coordination and update as applicable.        Discharge Placement  Back to the [facility] [place]                    Discharge Plan and Services Additional resources added to the After Visit Summary for                   Social Drivers of Health (SDOH) Interventions SDOH Screenings   Food Insecurity: No Food Insecurity (02/09/2024)  Housing: Low Risk  (02/09/2024)  Transportation Needs: No Transportation Needs (02/09/2024)  Utilities: Not At Risk (02/09/2024)  Social Connections: Moderately Isolated (02/09/2024)  Tobacco Use: Medium Risk (02/09/2024)     Readmission Risk Interventions     No data to display

## 2024-02-10 NOTE — Discharge Instructions (Signed)

## 2024-02-10 NOTE — Progress Notes (Signed)
 [facility]  [facility] Liaison Note   [name] is a current patient with [facility] with a terminal diagnosis of cerebrovascular disease.  Patient resides at [facility] and is followed by hospice care team.  Patient sustained mechanical fall 8.16.  Patient began experiencing pain to right hip and unable to ambulate with walker.  Patient was transported via EMS to [facility] ED for evaluation.  Upon evaluation, patient was found to have right hip fracture.  Patient did undergo surgery on 8.19.    Visited with patient at bedside this morning.  Patient reports he is doing well.  Patient denies pain at this time.  Patient states he has been in very little pain since having surgery, however, he reports he has not attempted to get out of bed and ambulate.  Patient reports he feels as though he is not quite ready for that and will follow the orders of providers on when to do so. There are currently no plans for discharge at this time.  It is recommended by inpatient providers that patient discharge to rehab facility when appropriate.        V/S: 97.5, 113/70 HR 110 RR 16 SpO2 94% RA   I/O: 900/100   Abnormal Labs: BUN 38, Calcium 8.2, GFR 55, WBC 14.1, RBC 3.76, Hemoglobin 11.7  Diagnostics: DG HIP (WITH OR WITHOUT PELVIS) 2-3V RIGHT FINDINGS: 6 fluoroscopic images are obtained during the performance of the procedure and are submitted for interpretation only. Intramedullary rod with proximal dynamic screws and distal interlocking screw are seen traversing the comminuted intertrochanteric right hip fracture on prior study. Alignment is anatomic. Please refer to the operative report.      IV/PRN medications: ceFAZolin  (ANCEF ) IVPB 2g/100 mL premix q6hr IV,     Recommendations/Plan per Krystal Doyne, PA 1 Day Post-Op Procedure(s) (LRB): Right hip cephalomedullary nailing (Right) Principal Problem:   Hip fracture (HCC) Active Problems:    Dyslipidemia   HTN (hypertension)   Sleep apnea   Type 2 diabetes mellitus with hyperlipidemia (HCC)   Paroxysmal atrial fibrillation with RVR (HCC)   Estimated body mass index is 18.31 kg/m as calculated from the following:   Height as of this encounter: 6' (1.829 m).   Weight as of this encounter: 61.2 kg. Advance diet Up with therapy D/C IV fluids   Discharge planning: Follow-up at [facility] clinic orthopedics in 2 weeks for staple removal and x-rays of the right hip.  Dressing changes needed.  Discharge planning to rehab   DVT Prophylaxis - Lovenox  and TED hose Weight-Bearing as tolerated to right leg    Discharge Planning: Rehab facility   Family contact: Wyndham Santilli [phone]  IDT: updated   Goals of care: DNR-limited   Should patient need ambulance transport, please use [facility] as they contract this service for our active hospice patients.   Please call with any hospice questions or concerns.  Daphne Shed, LPN [facility] Liaison [phone]

## 2024-02-10 NOTE — Evaluation (Signed)
 Occupational Therapy Evaluation Patient Details Name: Herbert Phillips MRN: [id number] DOB: Nov 17, 1925 Today's Date: 02/10/2024   History of Present Illness   Pt admitted to [facility] on 02/08/24 for mechanical fall that resulted in R intertrochanteric hip fracture. S/p IM nailing on 8/19 by Dr. Tobie. Significant PMH includes: T2DM, bladder cancer, HTN, sleep apnea, left hip fracture s/p IM nailing (2017), hx CVA, pAfib, transurethral resection of bladder tumor in 2016, on hospice care.     Clinical Impressions Pt was seen for OT evaluation this date. Prior to hospital admission, pt was living at [facility] where he received assistance with ADLs and Supervision for mobility at [facility] level from room to nurse's station 1-2 times per day. Pt presents with deficits in decreased BUE strength, decreased activity tolerance, decreased sitting and standing balance, affecting safe and optimal ADL completion. Pt currently requires extensive assistance with LB ADLs and ADL transfers with a very high fall risk during standing components of ADL engagement.  Pt would benefit from skilled OT services to address noted impairments and functional limitations (see below for any additional details) in order to maximize safety and independence while minimizing future risk of falls, injury, and readmission. Anticipate the need for follow up OT services upon acute hospital DC.      If plan is discharge home, recommend the following:   A lot of help with bathing/dressing/bathroom;A lot of help with walking and/or transfers     Functional Status Assessment   Patient has had a recent decline in their functional status and demonstrates the ability to make significant improvements in function in a reasonable and predictable amount of time.     Equipment Recommendations   Other (comment) (defer to next venue of care)     Recommendations for Other Services         Precautions/Restrictions    Precautions Precautions: Fall Recall of Precautions/Restrictions: Impaired Restrictions Weight Bearing Restrictions Per Provider Order: Yes RLE Weight Bearing Per Provider Order: Weight bearing as tolerated     Mobility Bed Mobility Overal bed mobility: Needs Assistance Bed Mobility: Supine to Sit     Supine to sit: Mod assist, HOB elevated, Used rails          Transfers Overall transfer level: Needs assistance Equipment used: Rolling walker (2 wheels) Transfers: Sit to/from Stand, Bed to chair/wheelchair/BSC Sit to Stand: Min assist, +2 physical assistance, From elevated surface           General transfer comment: min assist +2 for STS from EOB with RW with shoes on, multimodal cues for safety, sequencing, and hand placement.      Balance Overall balance assessment: Needs assistance Sitting-balance support: Bilateral upper extremity supported, Feet supported Sitting balance-Leahy Scale: Fair Sitting balance - Comments: able to achieve fair seated balance with BUE support, significant kyphosis Postural control: Posterior lean   Standing balance-Leahy Scale: Zero Standing balance comment: mod-max assist at RW                           ADL either performed or assessed with clinical judgement   ADL Overall ADL's : Needs assistance/impaired     Grooming: Wash/dry face;Contact guard assist;Sitting           Upper Body Dressing : Moderate assistance;Sitting   Lower Body Dressing: Maximal assistance;Sit to/from stand Lower Body Dressing Details (indicate cue type and reason): donning socks/shoes Toilet Transfer: Maximal assistance;Rolling walker (2 wheels);+2 for physical assistance;+2 for safety/equipment  Toileting- Clothing Manipulation and Hygiene: Maximal assistance;+2 for physical assistance;+2 for safety/equipment;Sit to/from stand       Functional mobility during ADLs: +2 for physical assistance;Maximal assistance;+2 for  safety/equipment;Cueing for safety;Rolling walker (2 wheels)       Vision Baseline Vision/History: 1 Wears glasses Patient Visual Report: No change from baseline       Perception         Praxis         Pertinent Vitals/Pain Pain Assessment Pain Assessment: Faces Faces Pain Scale: Hurts a little bit Pain Location: R hip with movement, no pain at rest Pain Descriptors / Indicators: Grimacing Pain Intervention(s): Monitored during session, Repositioned, Limited activity within patient's tolerance     Extremity/Trunk Assessment Upper Extremity Assessment Upper Extremity Assessment: Generalized weakness   Lower Extremity Assessment Lower Extremity Assessment: Defer to PT evaluation       Communication Communication Communication: No apparent difficulties   Cognition Arousal: Alert Behavior During Therapy: [facility] for tasks assessed/performed                                 Following commands: Intact       Cueing  General Comments   Cueing Techniques: Verbal cues;Tactile cues;Visual cues  incision healing appropriate with no strikethrough on bandage; multiple bandaids to BLE with healing scabs   Exercises Other Exercises Other Exercises: Education on role of OT in Acute, participates in ADL engagement at EOB and transfers directly related to ADLs.   Shoulder Instructions      Home Living Family/patient expects to be discharged to:: Skilled nursing facility                                        Prior Functioning/Environment               Mobility Comments: Ambulates about 118ft with rollator with faculty nearby ADLs Comments: Lives at [facility] and received moderate assistance with ADLs, used RW for standing components of ADLs with significant staff support    OT Problem List: Decreased strength;Decreased coordination;Decreased activity tolerance;Decreased safety awareness;Decreased knowledge of use of DME  or AE;Impaired balance (sitting and/or standing)   OT Treatment/Interventions: Self-care/ADL training;Therapeutic exercise;Therapeutic activities;DME and/or AE instruction;Patient/family education;Balance training      OT Goals(Current goals can be found in the care plan section)   Acute Rehab OT Goals Patient Stated Goal: Get up and walk OT Goal Formulation: With patient Time For Goal Achievement: 02/24/24 Potential to Achieve Goals: Good ADL Goals Pt Will Perform Grooming: with min assist;standing Pt Will Perform Lower Body Dressing: sit to/from stand;with min assist Pt Will Transfer to Toilet: with mod assist;bedside commode;stand pivot transfer   OT Frequency:  Min 2X/week    Co-evaluation   Reason for Co-Treatment: For patient/therapist safety;To address functional/ADL transfers PT goals addressed during session: Mobility/safety with mobility OT goals addressed during session: ADL's and self-care      AM-PAC OT 6 Clicks Daily Activity     Outcome Measure Help from another person eating meals?: A Little Help from another person taking care of personal grooming?: A Little Help from another person toileting, which includes using toliet, bedpan, or urinal?: A Lot Help from another person bathing (including washing, rinsing, drying)?: A Lot Help from another person to put on and taking off regular upper body clothing?:  A Little Help from another person to put on and taking off regular lower body clothing?: A Lot 6 Click Score: 15   End of Session Equipment Utilized During Treatment: Gait belt;Rolling walker (2 wheels) Nurse Communication: Mobility status  Activity Tolerance: Patient tolerated treatment well Patient left: in chair;with call bell/phone within reach;with chair alarm set;with family/visitor present  OT Visit Diagnosis: Unsteadiness on feet (R26.81);Muscle weakness (generalized) (M62.81)                Time: 8772-8673 OT Time Calculation (min): 59  min Charges:  OT General Charges $OT Visit: 1 Visit OT Evaluation $OT Eval Moderate Complexity: 1 Mod OT Treatments $Self Care/Home Management : 8-22 mins $Therapeutic Activity: 23-37 mins  Harlene Sharps OTR/L   Harlene LITTIE Sharps 02/10/2024, 4:07 PM

## 2024-02-10 NOTE — Evaluation (Signed)
 Physical Therapy Evaluation Patient Details Name: Sanel Stemmer MRN: [id number] DOB: 1925/11/15 Today's Date: 02/10/2024  History of Present Illness  Pt admitted to [facility] on 02/08/24 for mechanical fall that resulted in R intertrochanteric hip fracture. S/p IM nailing on 8/19 by Dr. Tobie. Significant PMH includes: T2DM, bladder cancer, HTN, sleep apnea, left hip fracture s/p IM nailing (2017), hx CVA, pAfib, transurethral resection of bladder tumor in 2016, on hospice care.   Clinical Impression  Pt received EOB with OT, family at bedside, agreeable for PT eval in conjunction with OT. At baseline, pt is from [facility]: he ambulates with AD and faculty member nearby, otherwise uses mWC. He is mod I for ADL's and has assistance for IADL's and medication management.  Pt presents with generalized weakness, increased pain levels, decreased activity tolerance, decreased gross balance, gait deficits, significant kyphosis, and impaired skin integrity resulting in impaired functional mobility from baseline. Due to deficits, pt required mod assist for bed mobility (with OT), min assist +2 for transfers with RW, and mod-max assist to ambulate 55ft in RW. Increased time/effort required for standing balance/gait secondary to generalized weakness and poor balance.   Deficits limit the pt's ability to safely and independently perform ADL's, transfer, and ambulate. Pt will benefit from acute skilled PT services to address deficits for return to baseline function. Pt will benefit from post acute therapy services to address deficits for return to baseline function.         If plan is discharge home, recommend the following: Two people to help with walking and/or transfers;A lot of help with bathing/dressing/bathroom;Assistance with cooking/housework;Assist for transportation;Help with stairs or ramp for entrance   Can travel by private vehicle   No    Equipment Recommendations  (defer to post  acute)     Functional Status Assessment Patient has had a recent decline in their functional status and demonstrates the ability to make significant improvements in function in a reasonable and predictable amount of time.     Precautions / Restrictions Precautions Precautions: Fall Restrictions Weight Bearing Restrictions Per Provider Order: Yes RLE Weight Bearing Per Provider Order: Weight bearing as tolerated      Mobility  Bed Mobility               General bed mobility comments: seated EOB with OT upon entry    Transfers Overall transfer level: Needs assistance   Transfers: Sit to/from Stand Sit to Stand: Min assist, +2 physical assistance           General transfer comment: min assist +2 for STS from EOB with RW with shoes on, multimodal cues for safety, sequencing, and hand placement.    Ambulation/Gait Ambulation/Gait assistance: Mod assist, Max assist Gait Distance (Feet): 2 Feet           General Gait Details: initially mod assist progressing to max assist to take multiple steps from EOB>recliner. Difficulty with lateral weight shift and LE facilitation, requiring increased assist for weight shift, balance, and LE facilitation. Increased assist with time secondary to fatigue.    Balance Overall balance assessment: Needs assistance     Sitting balance - Comments: able to achieve fair seated balance with BUE support, significant kyphosis     Standing balance-Leahy Scale: Zero Standing balance comment: mod-max assist                             Pertinent Vitals/Pain Pain Assessment Pain Assessment:  Faces Faces Pain Scale: Hurts little more Pain Location: R hip with movement Pain Intervention(s): Monitored during session, Repositioned    Home Living Family/patient expects to be discharged to:: Skilled nursing facility (not sure if [facility] does rehab)                        Prior Function               Mobility  Comments: Ambulates about 135ft with rollator with faculty nearby ADLs Comments: IND with ADL's; assist for IADL's and medication     Extremity/Trunk Assessment   Upper Extremity Assessment Upper Extremity Assessment: Defer to OT evaluation    Lower Extremity Assessment Lower Extremity Assessment: Generalized weakness       Communication   Communication Communication: No apparent difficulties    Cognition Arousal: Alert                               Following commands: Intact       Cueing Cueing Techniques: Verbal cues, Tactile cues, Visual cues     General Comments General comments (skin integrity, edema, etc.): incision healing appropriate with no strikethrough on bandage; multiple bandaids to BLE with healing scabs    Exercises Other Exercises Other Exercises: Participates in bed mobility, transfers, and minimal gait. Educated re: PT role/POC, DC recommendations, WB status, safety with functional mobility, pain mgmt techniques (pursed lip breathing).   Assessment/Plan    PT Assessment Patient needs continued PT services  PT Problem List Decreased strength;Decreased activity tolerance;Decreased balance;Decreased mobility;Decreased skin integrity;Pain       PT Treatment Interventions DME instruction;Gait training;Stair training;Functional mobility training;Therapeutic activities;Therapeutic exercise;Balance training;Neuromuscular re-education;Patient/family education    PT Goals (Current goals can be found in the Care Plan section)  Acute Rehab PT Goals Patient Stated Goal: get better PT Goal Formulation: With patient/family Time For Goal Achievement: 02/24/24 Potential to Achieve Goals: Good    Frequency Min 2X/week     Co-evaluation PT/OT/SLP Co-Evaluation/Treatment: Yes Reason for Co-Treatment: For patient/therapist safety;To address functional/ADL transfers PT goals addressed during session: Mobility/safety with mobility OT goals addressed  during session: ADL's and self-care       AM-PAC PT 6 Clicks Mobility  Outcome Measure Help needed turning from your back to your side while in a flat bed without using bedrails?: A Lot Help needed moving from lying on your back to sitting on the side of a flat bed without using bedrails?: A Lot Help needed moving to and from a bed to a chair (including a wheelchair)?: A Lot Help needed standing up from a chair using your arms (e.g., wheelchair or bedside chair)?: A Little Help needed to walk in hospital room?: A Lot Help needed climbing 3-5 steps with a railing? : Total 6 Click Score: 12    End of Session Equipment Utilized During Treatment: Gait belt Activity Tolerance: Patient tolerated treatment well Patient left: in chair;with call bell/phone within reach;with chair alarm set;with family/visitor present Nurse Communication: Mobility status PT Visit Diagnosis: Unsteadiness on feet (R26.81);Muscle weakness (generalized) (M62.81);History of falling (Z91.81);Pain;Difficulty in walking, not elsewhere classified (R26.2) Pain - Right/Left: Right Pain - part of body: Hip    Time: 8690-8673 PT Time Calculation (min) (ACUTE ONLY): 17 min   Charges:   PT Evaluation $PT Eval Moderate Complexity: 1 Mod PT Treatments $Therapeutic Activity: 8-22 mins PT General Charges $$ ACUTE PT VISIT: 1 Visit  Camie CHARLENA Kluver, PT, DPT 3:10 PM,02/10/24 Physical Therapist - [facility]

## 2024-02-10 NOTE — Progress Notes (Signed)
 Progress Note    Herbert Phillips  FMW:[id number] DOB: 20-Sep-1925  DOA: 02/08/2024 PCP: Lenon Layman ORN, MD      Brief Narrative:    Medical records reviewed and are as summarized below:  Herbert Phillips is a 89 y.o. male with medical history significant for type II DM, bladder cancer, hypertension, sleep apnea, left hip fracture, history of stroke, paroxysmal atrial fibrillation, hypertension, intramedullary nail intertrochanteric left hip in 2017, transurethral resection of bladder tumor in 2016, on hospice care, was brought from [facility] facility because of right hip fracture.  Reportedly, patient had a fall when he tripped while leaving the facility where he lives about 2 days prior to admission.  An x-ray at the facility revealed right hip fracture.   He was brought to the ED for further management.  X-ray in the ED showed comminuted intertrochanteric right hip fracture  8/20: Hemodynamically stable, s/p ORIF on 02/09/2024.  Slight decrease in hemoglobin to 11.7 after the surgery-starting on supplement.  PT is recommending SNF  Patient is from a long-term facility and was under hospice care.   Assessment/Plan:   Principal Problem:   Hip fracture (HCC) Active Problems:   Paroxysmal atrial fibrillation with RVR (HCC)   Type 2 diabetes mellitus with hyperlipidemia (HCC)   HTN (hypertension)   Dyslipidemia   Sleep apnea    Body mass index is 18.31 kg/m.   Right intertrochanteric hip fracture: S/p Intramedullary nailing of right femur with cephalomedullary device on 02/09/2024.  Analgesics as needed for pain.  PT and OT evaluation-recommending SNF Orthopedic will follow-up as outpatient  Hypotension: Blood pressure borderline soft Hold metoprolol .  Continue IV fluids for hydration.  Leukocytosis: Slight worsening, likely reactive after surgery -Continue to monitor  Thrombocytopenia: Improved   Paroxysmal atrial fibrillation: Hold  metoprolol .  Type II DM: NovoLog  as needed for hyperglycemia  History of stroke: He was on aspirin  prior to admission   Patient is on hospice and is followed by the hospice team at [facility].   Diet Order             Diet regular Room service appropriate? Yes; Fluid consistency: Thin  Diet effective now                  Consultants: Orthopedic surgeon  Procedures: Intramedullary nailing of right femur with cephalomedullary device on 02/09/2024    Medications:    acetaminophen   1,000 mg Oral Q8H   docusate sodium   100 mg Oral BID   enoxaparin  (LOVENOX ) injection  40 mg Subcutaneous Q24H   Fe Fum-Vit C-Vit B12-FA  1 capsule Oral QPC breakfast   insulin  aspart  0-5 Units Subcutaneous QHS   insulin  aspart  0-9 Units Subcutaneous TID WC   ketorolac   7.5 mg Intravenous Q6H   metoprolol  tartrate  50 mg Oral BID   traZODone   50 mg Oral QHS   Continuous Infusions:  sodium chloride  Stopped (02/10/24 0925)     Anti-infectives (From admission, onward)    Start     Dose/Rate Route Frequency Ordered Stop   02/09/24 1830  ceFAZolin  (ANCEF ) IVPB 2g/100 mL premix        2 g 200 mL/hr over 30 Minutes Intravenous Every 6 hours 02/09/24 1500 02/10/24 0527   02/09/24 1100  ceFAZolin  (ANCEF ) IVPB 2g/100 mL premix        2 g 200 mL/hr over 30 Minutes Intravenous On call to O.R. 02/08/24 2235 02/09/24 1228  Family Communication/Anticipated D/C date and plan/Code Status  Discussed with grandson at bedside  DVT prophylaxis: enoxaparin  (LOVENOX ) injection 40 mg Start: 02/10/24 0800 SCDs Start: 02/09/24 1501     Code Status: Limited: Do not attempt resuscitation (DNR) -DNR-LIMITED -Do Not Intubate/DNI    Disposition Plan: Plan to discharge to SNF   Status is: Inpatient Remains inpatient appropriate because: Right hip fracture s/p repair  Subjective:   Patient was seen and examined today.  No significant pain.  Grandson at bedside.  Objective:     Vitals:   02/10/24 0413 02/10/24 0828 02/10/24 1555 02/10/24 1618  BP: (!) 90/51 113/70 92/64 (!) 102/57  Pulse: 86 (!) 110 68 75  Resp: 18 16 18 16   Temp: 98 F (36.7 C) (!) 97.5 F (36.4 C)  (!) 97.4 F (36.3 C)  TempSrc:  Oral  Oral  SpO2: 100% 94% 94% 94%  Weight:      Height:       No data found.   Intake/Output Summary (Last 24 hours) at 02/10/2024 1621 Last data filed at 02/10/2024 1407 Gross per 24 hour  Intake 1519.87 ml  Output --  Net 1519.87 ml   Filed Weights   02/08/24 1501 02/09/24 0957  Weight: 61.2 kg 61.2 kg    Exam: General.  Frail elderly man, in no acute distress. Pulmonary.  Lungs clear bilaterally, normal respiratory effort. CV.  Regular rate and rhythm, no JVD, rub or murmur. Abdomen.  Soft, nontender, nondistended, BS positive. CNS.  Alert and oriented .  No focal neurologic deficit. Extremities.  No edema, no cyanosis, pulses intact and symmetrical.  Data Reviewed:   I have personally reviewed following labs and imaging studies:  Labs: Labs show the following:   Basic Metabolic Panel: Recent Labs  Lab 02/08/24 1525 02/08/24 2103 02/09/24 0438 02/10/24 0427  NA 137  --  136 137  K 4.2  --  3.9 4.4  CL 101  --  101 105  CO2 24  --  28 25  GLUCOSE 142*  --  125* 124*  BUN 32*  --  25* 38*  CREATININE 1.15 1.01 1.00 1.22  CALCIUM 8.5*  --  8.9 8.2*   GFR Estimated Creatinine Clearance: 31.4 mL/min (by C-G formula based on SCr of 1.22 mg/dL). Liver Function Tests: No results for input(s): AST, ALT, ALKPHOS, BILITOT, PROT, ALBUMIN in the last 168 hours. No results for input(s): LIPASE, AMYLASE in the last 168 hours. No results for input(s): AMMONIA in the last 168 hours. Coagulation profile Recent Labs  Lab 02/08/24 1524  INR 1.1    CBC: Recent Labs  Lab 02/08/24 1525 02/08/24 2103 02/09/24 0438 02/10/24 0427  WBC 12.7* 14.1* 11.9* 14.1*  NEUTROABS 8.4*  --   --   --   HGB 10.9* 12.5* 12.7*  11.7*  HCT 34.3* 37.6* 38.8* 36.2*  MCV 95.0 92.4 93.0 96.3  PLT 142* 148* 163 197   Cardiac Enzymes: No results for input(s): CKTOTAL, CKMB, CKMBINDEX, TROPONINI in the last 168 hours. BNP (last 3 results) No results for input(s): PROBNP in the last 8760 hours. CBG: Recent Labs  Lab 02/09/24 1353 02/09/24 1513 02/09/24 1745 02/09/24 2239 02/10/24 1134  GLUCAP 123* 144* 140* 213* 127*   D-Dimer: No results for input(s): DDIMER in the last 72 hours. Hgb A1c: Recent Labs    02/08/24 2103  HGBA1C 5.6   Lipid Profile: No results for input(s): CHOL, HDL, LDLCALC, TRIG, CHOLHDL, LDLDIRECT in the last 72 hours. Thyroid  function studies: No results for input(s): TSH, T4TOTAL, T3FREE, THYROIDAB in the last 72 hours.  Invalid input(s): FREET3 Anemia work up: No results for input(s): VITAMINB12, FOLATE, FERRITIN, TIBC, IRON, RETICCTPCT in the last 72 hours. Sepsis Labs: Recent Labs  Lab 02/08/24 1525 02/08/24 2103 02/09/24 0438 02/10/24 0427  WBC 12.7* 14.1* 11.9* 14.1*    Microbiology No results found for this or any previous visit (from the past 240 hours).  Procedures and diagnostic studies:  DG HIP UNILAT WITH PELVIS 2-3 VIEWS RIGHT Result Date: 02/09/2024 CLINICAL DATA:  ORIF right hip fracture EXAM: DG HIP (WITH OR WITHOUT PELVIS) 2-3V RIGHT COMPARISON:  02/08/2024 FINDINGS: 6 fluoroscopic images are obtained during the performance of the procedure and are submitted for interpretation only. Intramedullary rod with proximal dynamic screws and distal interlocking screw are seen traversing the comminuted intertrochanteric right hip fracture on prior study. Alignment is anatomic. Please refer to the operative report. Fluoroscopy time: 1 minute 36 seconds, 15.54 mGy IMPRESSION: 1. ORIF of an intertrochanteric right hip fracture as above. Please refer to the operative report. Electronically Signed   By: Ozell Daring M.D.   On:  02/09/2024 15:33   DG C-Arm 1-60 Min-No Report Result Date: 02/09/2024 Fluoroscopy was utilized by the requesting physician.  No radiographic interpretation.     LOS: 2 days   Amaryllis Dare, MD  [facility]   Pager on [email or web address]. If 7PM-7AM, please contact night-coverage at [email or web address]   02/10/2024, 4:21 PM

## 2024-02-10 NOTE — Plan of Care (Signed)

## 2024-02-10 NOTE — Anesthesia Postprocedure Evaluation (Signed)
 Anesthesia Post Note  Patient: Herbert Phillips  Procedure(s) Performed: Right hip cephalomedullary nailing (Right: Hip)  Patient location during evaluation: PACU Anesthesia Type: General Level of consciousness: awake and alert Pain management: pain level controlled Vital Signs Assessment: post-procedure vital signs reviewed and stable Respiratory status: spontaneous breathing, nonlabored ventilation, respiratory function stable and patient connected to nasal cannula oxygen Cardiovascular status: blood pressure returned to baseline and stable Postop Assessment: no apparent nausea or vomiting Anesthetic complications: no   No notable events documented.   Last Vitals:  Vitals:   02/10/24 0046 02/10/24 0413  BP: (!) 92/50 (!) 90/51  Pulse: 70 86  Resp: 18 18  Temp: 37.1 C 36.7 C  SpO2: 98% 100%    Last Pain:  Vitals:   02/10/24 0417  TempSrc:   PainSc: 0-No pain                 Debby Mines

## 2024-02-10 NOTE — Progress Notes (Signed)
  Subjective: 1 Day Post-Op Procedure(s) (LRB): Right hip cephalomedullary nailing (Right) Patient reports pain as mild.   Patient is well, and has had no acute complaints or problems Plan is to go Rehab after hospital stay. Negative for chest pain and shortness of breath Fever: no Gastrointestinal: Negative for nausea and vomiting  Objective: Vital signs in last 24 hours: Temp:  [97.4 F (36.3 C)-98.7 F (37.1 C)] 98 F (36.7 C) (08/20 0413) Pulse Rate:  [70-117] 86 (08/20 0413) Resp:  [17-25] 18 (08/20 0413) BP: (82-129)/(50-72) 90/51 (08/20 0413) SpO2:  [92 %-100 %] 100 % (08/20 0413) Weight:  [61.2 kg] 61.2 kg (08/19 0957)  Intake/Output from previous day:  Intake/Output Summary (Last 24 hours) at 02/10/2024 0715 Last data filed at 02/09/2024 1900 Gross per 24 hour  Intake 900 ml  Output 100 ml  Net 800 ml    Intake/Output this shift: No intake/output data recorded.  Labs: Recent Labs    02/08/24 1525 02/08/24 2103 02/09/24 0438 02/10/24 0427  HGB 10.9* 12.5* 12.7* 11.7*   Recent Labs    02/09/24 0438 02/10/24 0427  WBC 11.9* 14.1*  RBC 4.17* 3.76*  HCT 38.8* 36.2*  PLT 163 197   Recent Labs    02/09/24 0438 02/10/24 0427  NA 136 137  K 3.9 4.4  CL 101 105  CO2 28 25  BUN 25* 38*  CREATININE 1.00 1.22  GLUCOSE 125* 124*  CALCIUM 8.9 8.2*   Recent Labs    02/08/24 1524  INR 1.1     EXAM General - Patient is Alert and Confused Extremity - Neurovascular intact Sensation intact distally Dorsiflexion/Plantar flexion intact Compartment soft Dressing/Incision - clean, dry, no drainage Motor Function - intact, moving foot and toes well on exam.   Past Medical History:  Diagnosis Date   Arthritis    B12 deficiency    Bladder cancer (HCC)    2 yrs ago   Diabetes (HCC) 09/14/2013   Dysrhythmia    Bradycardia   Hip fracture, left (HCC) 02/04/2016   Hypertension    Non-insulin  dependent type 2 diabetes mellitus (HCC)    Sleep apnea      Assessment/Plan: 1 Day Post-Op Procedure(s) (LRB): Right hip cephalomedullary nailing (Right) Principal Problem:   Hip fracture (HCC) Active Problems:   Dyslipidemia   HTN (hypertension)   Sleep apnea   Type 2 diabetes mellitus with hyperlipidemia (HCC)   Paroxysmal atrial fibrillation with RVR (HCC)  Estimated body mass index is 18.31 kg/m as calculated from the following:   Height as of this encounter: 6' (1.829 m).   Weight as of this encounter: 61.2 kg. Advance diet Up with therapy D/C IV fluids  Discharge planning: Follow-up at [facility] clinic orthopedics in 2 weeks for staple removal and x-rays of the right hip.  Dressing changes needed.  Discharge planning to rehab  DVT Prophylaxis - Lovenox  and TED hose Weight-Bearing as tolerated to right leg  Krystal Doyne, PA-C Orthopaedic Surgery 02/10/2024, 7:15 AM

## 2024-02-11 ENCOUNTER — Encounter: Payer: Self-pay | Admitting: Orthopedic Surgery

## 2024-02-11 DIAGNOSIS — E1169 Type 2 diabetes mellitus with other specified complication: Secondary | ICD-10-CM | POA: Diagnosis not present

## 2024-02-11 DIAGNOSIS — I480 Paroxysmal atrial fibrillation: Secondary | ICD-10-CM | POA: Diagnosis not present

## 2024-02-11 DIAGNOSIS — I10 Essential (primary) hypertension: Secondary | ICD-10-CM | POA: Diagnosis not present

## 2024-02-11 DIAGNOSIS — S72001A Fracture of unspecified part of neck of right femur, initial encounter for closed fracture: Secondary | ICD-10-CM | POA: Diagnosis not present

## 2024-02-11 LAB — BASIC METABOLIC PANEL WITH GFR
Anion gap: 6 (ref 5–15)
BUN: 44 mg/dL — ABNORMAL HIGH (ref 8–23)
CO2: 25 mmol/L (ref 22–32)
Calcium: 8 mg/dL — ABNORMAL LOW (ref 8.9–10.3)
Chloride: 103 mmol/L (ref 98–111)
Creatinine, Ser: 1.15 mg/dL (ref 0.61–1.24)
GFR, Estimated: 59 mL/min — ABNORMAL LOW (ref >60)
Glucose, Bld: 111 mg/dL — ABNORMAL HIGH (ref 70–99)
Potassium: 4.4 mmol/L (ref 3.5–5.1)
Sodium: 134 mmol/L — ABNORMAL LOW (ref 135–145)

## 2024-02-11 LAB — CBC
HCT: 32.4 % — ABNORMAL LOW (ref 39.0–52.0)
Hemoglobin: 10.4 g/dL — ABNORMAL LOW (ref 13.0–17.0)
MCH: 30.5 pg (ref 26.0–34.0)
MCHC: 32.1 g/dL (ref 30.0–36.0)
MCV: 95 fL (ref 80.0–100.0)
Platelets: 194 K/uL (ref 150–400)
RBC: 3.41 MIL/uL — ABNORMAL LOW (ref 4.22–5.81)
RDW: 15.2 % (ref 11.5–15.5)
WBC: 10.6 K/uL — ABNORMAL HIGH (ref 4.0–10.5)
nRBC: 0 % (ref 0.0–0.2)

## 2024-02-11 LAB — GLUCOSE, CAPILLARY: Glucose-Capillary: 151 mg/dL — ABNORMAL HIGH (ref 70–99)

## 2024-02-11 MED ORDER — METHOCARBAMOL 500 MG PO TABS: 500.0000 mg | ORAL_TABLET | Freq: Four times a day (QID) | ORAL | Status: AC | PRN

## 2024-02-11 MED ORDER — FE FUM-VIT C-VIT B12-FA 460-60-0.01-1 MG PO CAPS: 1.0000 | ORAL_CAPSULE | Freq: Every day | ORAL | Status: DC

## 2024-02-11 NOTE — Progress Notes (Signed)
 Patient to discharge back to SNF with hospice. PIV removed. Follow up instructions included in transfer packet. Report called. Questions answered. No concerns voiced

## 2024-02-11 NOTE — Discharge Summary (Signed)
 Physician Discharge Summary   Patient: Herbert Phillips MRN: [id number] DOB: 04-12-1926  Admit date:     02/08/2024  Discharge date: 02/11/24  Discharge Physician: Amaryllis Dare   PCP: Lenon Layman ORN, MD   Recommendations at discharge:  Please obtain CBC and BMP and follow-up Patient has been started on some supplement for postoperative anemia Please use pain medications carefully and avoid constipation. Follow-up with orthopedic surgery Follow-up with primary care provider  Discharge Diagnoses: Principal Problem:   Hip fracture (HCC) Active Problems:   Paroxysmal atrial fibrillation with RVR (HCC)   Type 2 diabetes mellitus with hyperlipidemia (HCC)   HTN (hypertension)   Dyslipidemia   Sleep apnea   Hospital Course: Herbert Phillips is a 89 y.o. male with medical history significant for type II DM, bladder cancer, hypertension, sleep apnea, left hip fracture, history of stroke, paroxysmal atrial fibrillation, hypertension, intramedullary nail intertrochanteric left hip in 2017, transurethral resection of bladder tumor in 2016, on hospice care, was brought from [facility] facility because of right hip fracture.  Reportedly, patient had a fall when he tripped while leaving the facility where he lives about 2 days prior to admission.  An x-ray at the facility revealed right hip fracture.    He was brought to the ED for further management.   X-ray in the ED showed comminuted intertrochanteric right hip fracture, orthopedic surgery was consulted and he was taken to OR s/p ORIF.  Patient tolerated the procedure well, mild postsurgical anemia due to expected blood loss during surgery.  Patient was started on some supplement.  Slight worsening of leukocytosis after the surgery likely reactive and started improving.  Pain seems well-controlled, patient was given opioids to use as needed.  Please continue with bowel regimen to avoid constipation if taking opioids.  He  was also given Robaxin  to use as needed for muscle spasms.   Patient was prescribed Lovenox  for 4 weeks as DVT prophylaxis after the hip surgery.  Patient is currently under hospice care and will continue follow-up with them.  Patient will continue the rest of his home medications and follow-up with his providers for further assistance.  Pain control - [place]  Controlled Substance Reporting System database was reviewed. and patient was instructed, not to drive, operate heavy machinery, perform activities at heights, swimming or participation in water  activities or provide baby-sitting services while on Pain, Sleep and Anxiety Medications; until their outpatient Physician has advised to do so again. Also recommended to not to take more than prescribed Pain, Sleep and Anxiety Medications.  Consultants: Orthopedic surgery Procedures performed: ORIF Disposition: Skilled nursing facility Diet recommendation:  Discharge Diet Orders (From admission, onward)     Start     Ordered   02/11/24 0000  Diet - low sodium heart healthy        02/11/24 1054           Regular diet DISCHARGE MEDICATION: Allergies as of 02/11/2024   No Known Allergies      Medication List     STOP taking these medications    allopurinol  100 MG tablet Commonly known as: ZYLOPRIM    cyanocobalamin  1000 MCG tablet Commonly known as: VITAMIN B12   Omega 3 1000 MG Caps   PreserVision AREDS 2 Caps       TAKE these medications    acetaminophen  325 MG tablet Commonly known as: TYLENOL  Take 650 mg by mouth every 4 (four) hours as needed for mild pain, moderate pain or fever.  Artificial Tears PF 0.1-0.3 % Soln Generic drug: Dextran 70-Hypromellose (PF) Place 1 drop into both eyes 2 (two) times daily.   aspirin  EC 81 MG tablet Take 81 mg by mouth daily.   bacitracin  ophthalmic ointment Place 1 Application into the left eye.   cholecalciferol 25 MCG (1000 UNIT) tablet Commonly known as:  VITAMIN D3 Take 1,000 Units by mouth daily.   Cod Liver Oil 1000 MG Caps Take 1 capsule by mouth daily.   docusate sodium  100 MG capsule Commonly known as: COLACE Take 1 capsule (100 mg total) by mouth 2 (two) times daily.   enoxaparin  40 MG/0.4ML injection Commonly known as: LOVENOX  Inject 0.4 mLs (40 mg total) into the skin daily for 28 days.   Fe Fum-Vit C-Vit B12-FA Caps capsule Commonly known as: TRIGELS-F FORTE Take 1 capsule by mouth daily after breakfast. Start taking on: February 12, 2024   FREESTYLE LITE test strip Generic drug: glucose blood USE 1 STRIP TO CHECK GLUCOSE TWICE DAILY   furosemide  20 MG tablet Commonly known as: LASIX  Take 20 mg by mouth as needed.   Glucerna Liqd Take 237 mLs by mouth.   Glucosamine Relief 1000 MG Tabs Generic drug: Glucosamine Sulfate Take 1,000 mg by mouth daily.   ipratropium 0.03 % nasal spray Commonly known as: ATROVENT Place 2 sprays into both nostrils 3 (three) times daily.   ipratropium-albuterol  0.5-2.5 (3) MG/3ML Soln Commonly known as: DUONEB Inhale 3 mLs into the lungs as needed (for wheezing or shortness of breath).   LORazepam 0.5 MG tablet Commonly known as: ATIVAN Take 0.5 mg by mouth every 12 (twelve) hours as needed for anxiety.   lovastatin 20 MG tablet Commonly known as: MEVACOR Take 20 mg by mouth daily at 6 PM.   magnesium  hydroxide 400 MG/5ML suspension Commonly known as: MILK OF MAGNESIA Take 30 mLs by mouth every 4 (four) hours as needed for mild constipation.   metFORMIN 500 MG tablet Commonly known as: GLUCOPHAGE Take 500 mg by mouth daily. Take with a meal.   methocarbamol  500 MG tablet Commonly known as: ROBAXIN  Take 1 tablet (500 mg total) by mouth every 6 (six) hours as needed for up to 5 days for muscle spasms.   metoprolol  tartrate 50 MG tablet Commonly known as: LOPRESSOR  Take 1 tablet (50 mg total) by mouth 2 (two) times daily.   NATURAL FIBER LAXATIVE PO Take by mouth.    NON FORMULARY CPAP   oxyCODONE  5 MG immediate release tablet Commonly known as: Oxy IR/ROXICODONE  Take 0.5-1 tablets (2.5-5 mg total) by mouth every 4 (four) hours as needed for moderate pain (pain score 4-6) (pain score 4-6).   Refresh 1.4-0.6 % Soln Generic drug: Polyvinyl Alcohol-Povidone PF Place 1 drop into both eyes 2 (two) times daily as needed (dry eyes).   RISA-BID PROBIOTIC PO Take by mouth.   Systane Nighttime Oint Apply to both eyes at bedtime  as needed for eye irritation.   traMADol  50 MG tablet Commonly known as: ULTRAM  Take 1 tablet (50 mg total) by mouth every 6 (six) hours as needed (moderate pain, other narcotics cause side effects).   traZODone  50 MG tablet Commonly known as: DESYREL  Take 50 mg by mouth at bedtime.               Discharge Care Instructions  (From admission, onward)           Start     Ordered   02/11/24 0000  Leave dressing on - Keep  it clean, dry, and intact until clinic visit        02/11/24 1054            Follow-up Information     Verlinda Boas, PA-C Follow up in 2 week(s).   Specialty: Orthopedic Surgery Why: For staple removal and x-rays of the right hip Contact information: [address] [place] [address] [phone]         Lenon Layman ORN, MD. Schedule an appointment as soon as possible for a visit in 1 week(s).   Specialty: Internal Medicine Contact information: [address] [facility] [place] [address] [phone]                Discharge Exam: Fredricka Weights   02/08/24 1501 02/09/24 0957  Weight: 61.2 kg 61.2 kg   General.  Frail and malnourished elderly man, in no acute distress. Pulmonary.  Lungs clear bilaterally, normal respiratory effort. CV.  Regular rate and rhythm, no JVD, rub or murmur. Abdomen.  Soft, nontender, nondistended, BS positive. CNS.  Alert and oriented .  No focal neurologic deficit. Extremities.   No edema,  pulses intact and symmetrical.  Condition at discharge: stable  The results of significant diagnostics from this hospitalization (including imaging, microbiology, ancillary and laboratory) are listed below for reference.   Imaging Studies: DG HIP UNILAT WITH PELVIS 2-3 VIEWS RIGHT Result Date: 02/09/2024 CLINICAL DATA:  ORIF right hip fracture EXAM: DG HIP (WITH OR WITHOUT PELVIS) 2-3V RIGHT COMPARISON:  02/08/2024 FINDINGS: 6 fluoroscopic images are obtained during the performance of the procedure and are submitted for interpretation only. Intramedullary rod with proximal dynamic screws and distal interlocking screw are seen traversing the comminuted intertrochanteric right hip fracture on prior study. Alignment is anatomic. Please refer to the operative report. Fluoroscopy time: 1 minute 36 seconds, 15.54 mGy IMPRESSION: 1. ORIF of an intertrochanteric right hip fracture as above. Please refer to the operative report. Electronically Signed   By: Ozell Daring M.D.   On: 02/09/2024 15:33   DG C-Arm 1-60 Min-No Report Result Date: 02/09/2024 Fluoroscopy was utilized by the requesting physician.  No radiographic interpretation.   DG Hip Unilat  With Pelvis 2-3 Views Right Result Date: 02/08/2024 CLINICAL DATA:  Clemens, right hip pain, deformity EXAM: DG HIP (WITH OR WITHOUT PELVIS) 2-3V RIGHT COMPARISON:  None Available. FINDINGS: Frontal view of the pelvis as well as frontal and frogleg lateral views of the right hip are obtained. There is a comminuted intertrochanteric right hip fracture with mild varus angulation at the fracture site. No dislocation. Prior left hip ORIF spanning a healed proximal left femoral fracture. The remainder of the visualized bony pelvis is unremarkable. The bilateral iliac crests are excluded by collimation. IMPRESSION: 1. Comminuted intertrochanteric right hip fracture, with mild varus angulation at the fracture site. Electronically Signed   By: Ozell Daring  M.D.   On: 02/08/2024 15:59    Microbiology: Results for orders placed or performed during the hospital encounter of 10/17/22  MRSA Next Gen by PCR, Nasal     Status: None   Collection Time: 10/18/22  8:11 AM   Specimen: Nasal Mucosa; Nasal Swab  Result Value Ref Range Status   MRSA by PCR Next Gen NOT DETECTED NOT DETECTED Final    Comment: (NOTE) The GeneXpert MRSA Assay (FDA approved for NASAL specimens only), is one component of a comprehensive MRSA colonization surveillance program. It is not intended to diagnose MRSA infection  nor to guide or monitor treatment for MRSA infections. Test performance is not FDA approved in patients less than 11 years old. Performed at [facility], [address] [place], [place] [address]     Labs: CBC: Recent Labs  Lab 02/08/24 1525 02/08/24 2103 02/09/24 0438 02/10/24 0427 02/11/24 0521  WBC 12.7* 14.1* 11.9* 14.1* 10.6*  NEUTROABS 8.4*  --   --   --   --   HGB 10.9* 12.5* 12.7* 11.7* 10.4*  HCT 34.3* 37.6* 38.8* 36.2* 32.4*  MCV 95.0 92.4 93.0 96.3 95.0  PLT 142* 148* 163 197 194   Basic Metabolic Panel: Recent Labs  Lab 02/08/24 1525 02/08/24 2103 02/09/24 0438 02/10/24 0427 02/11/24 0521  NA 137  --  136 137 134*  K 4.2  --  3.9 4.4 4.4  CL 101  --  101 105 103  CO2 24  --  28 25 25   GLUCOSE 142*  --  125* 124* 111*  BUN 32*  --  25* 38* 44*  CREATININE 1.15 1.01 1.00 1.22 1.15  CALCIUM 8.5*  --  8.9 8.2* 8.0*   Liver Function Tests: No results for input(s): AST, ALT, ALKPHOS, BILITOT, PROT, ALBUMIN in the last 168 hours. CBG: Recent Labs  Lab 02/09/24 1513 02/09/24 1745 02/09/24 2239 02/10/24 1134 02/10/24 2155  GLUCAP 144* 140* 213* 127* 124*    Discharge time spent: greater than 30 minutes.  This record has been created using Conservation officer, historic buildings. Errors have been sought and corrected,but may not always be located. Such creation errors do not reflect on the standard  of care.   Signed: Amaryllis Dare, MD [facility] 02/11/2024

## 2024-02-11 NOTE — Progress Notes (Signed)
  Subjective: 2 Days Post-Op Procedure(s) (LRB): Right hip cephalomedullary nailing (Right) Patient reports pain as mild.   Patient is well, and has had no acute complaints or problems Plan is to go Rehab after hospital stay. Negative for chest pain and shortness of breath Fever: no Gastrointestinal: Negative for nausea and vomiting  Objective: Vital signs in last 24 hours: Temp:  [97.4 F (36.3 C)-98 F (36.7 C)] 97.7 F (36.5 C) (08/21 0323) Pulse Rate:  [68-110] 78 (08/21 0323) Resp:  [16-18] 16 (08/21 0323) BP: (90-113)/(49-70) 90/49 (08/21 0323) SpO2:  [93 %-95 %] 93 % (08/21 0323)  Intake/Output from previous day:  Intake/Output Summary (Last 24 hours) at 02/11/2024 0724 Last data filed at 02/10/2024 1847 Gross per 24 hour  Intake 1699.87 ml  Output 400 ml  Net 1299.87 ml    Intake/Output this shift: No intake/output data recorded.  Labs: Recent Labs    02/08/24 1525 02/08/24 2103 02/09/24 0438 02/10/24 0427 02/11/24 0521  HGB 10.9* 12.5* 12.7* 11.7* 10.4*   Recent Labs    02/10/24 0427 02/11/24 0521  WBC 14.1* 10.6*  RBC 3.76* 3.41*  HCT 36.2* 32.4*  PLT 197 194   Recent Labs    02/10/24 0427 02/11/24 0521  NA 137 134*  K 4.4 4.4  CL 105 103  CO2 25 25  BUN 38* 44*  CREATININE 1.22 1.15  GLUCOSE 124* 111*  CALCIUM 8.2* 8.0*   Recent Labs    02/08/24 1524  INR 1.1     EXAM General - Patient is Alert and Confused Extremity - Neurovascular intact Sensation intact distally Dorsiflexion/Plantar flexion intact Compartment soft Dressing/Incision - clean, dry, no drainage Motor Function - intact, moving foot and toes well on exam.  Ambulated 2 feet with physical therapy  Past Medical History:  Diagnosis Date   Arthritis    B12 deficiency    Bladder cancer (HCC)    2 yrs ago   Diabetes (HCC) 09/14/2013   Dysrhythmia    Bradycardia   Hip fracture, left (HCC) 02/04/2016   Hypertension    Non-insulin  dependent type 2 diabetes  mellitus (HCC)    Sleep apnea     Assessment/Plan: 2 Days Post-Op Procedure(s) (LRB): Right hip cephalomedullary nailing (Right) Principal Problem:   Hip fracture (HCC) Active Problems:   Dyslipidemia   HTN (hypertension)   Sleep apnea   Type 2 diabetes mellitus with hyperlipidemia (HCC)   Paroxysmal atrial fibrillation with RVR (HCC)  Estimated body mass index is 18.31 kg/m as calculated from the following:   Height as of this encounter: 6' (1.829 m).   Weight as of this encounter: 61.2 kg. Advance diet Up with therapy D/C IV fluids  Discharge planning: Follow-up at [facility] clinic orthopedics in 2 weeks for staple removal and x-rays of the right hip.  Dressing changes needed.  Discharge planning to rehab  DVT Prophylaxis - Lovenox  and TED hose Weight-Bearing as tolerated to right leg  Krystal Doyne, PA-C Orthopaedic Surgery 02/11/2024, 7:24 AM

## 2024-02-11 NOTE — Plan of Care (Signed)

## 2024-02-11 NOTE — NC FL2 (Signed)
 [place]  MEDICAID FL2 LEVEL OF CARE FORM     IDENTIFICATION  Patient Name: Herbert Phillips Birthdate: 08-15-1925 Sex: male Admission Date (Current Location): 02/08/2024  [facility] and [place] Number:  Chiropodist and Address:  [facility], [address], [place], [place] [address]      Provider Number: [id number]  Attending Physician Name and Address:  Caleen Qualia, MD  Relative Name and Phone Number:       Current Level of Care: Hospital Recommended Level of Care: Skilled Nursing Facility Prior Approval Number:    Date Approved/Denied:   PASRR Number: [id number]  Discharge Plan: SNF    Current Diagnoses: Patient Active Problem List   Diagnosis Date Noted   Hip fracture (HCC) 02/08/2024   Acute delirium 10/19/2022   History of gout 10/18/2022   Paroxysmal atrial fibrillation with RVR (HCC) 10/17/2022   Closed traumatic fracture of ribs of left side with pneumothorax 10/17/2022   Closed T12 fracture (HCC) 10/17/2022   Bradycardia 04/05/2018   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   Cancer of overlapping sites of bladder (HCC) 05/30/2016   Closed displaced subtrochanteric fracture of left femur (HCC) 02/04/2016   Dyslipidemia 09/14/2013   HTN (hypertension) 09/14/2013   Sleep apnea 09/14/2013   Type 2 diabetes mellitus with hyperlipidemia (HCC) 09/14/2013    Orientation RESPIRATION BLADDER Height & Weight     Self, Time, Situation  Normal Incontinent Weight: 135 lb (61.2 kg) Height:  6' (182.9 cm)  BEHAVIORAL SYMPTOMS/MOOD NEUROLOGICAL BOWEL NUTRITION STATUS      Incontinent Diet (Regular)  AMBULATORY STATUS COMMUNICATION OF NEEDS Skin   Extensive Assist Verbally Surgical wounds (Surgical Closed incision, right thigh)                       Personal Care Assistance Level of Assistance  Bathing, Dressing, Feeding Bathing Assistance: Limited assistance Feeding assistance: Limited  assistance Dressing Assistance: Limited assistance     Functional Limitations Info  Sight, Hearing, Speech Sight Info: Impaired Hearing Info: Adequate Speech Info: Adequate    SPECIAL CARE FACTORS FREQUENCY  PT (By licensed PT), OT (By licensed OT)     PT Frequency: 5x/week OT Frequency: 5x/week            Contractures      Additional Factors Info  Code Status, Allergies Code Status Info: Limited Allergies Info: NKA           Current Medications (02/11/2024):  This is the current hospital active medication list Current Facility-Administered Medications  Medication Dose Route Frequency Provider Last Rate Last Admin   0.9 %  sodium chloride  infusion   Intravenous Continuous Tobie Priest, MD   Stopped at 02/10/24 9074   acetaminophen  (TYLENOL ) tablet 1,000 mg  1,000 mg Oral Q8H Tobie Priest, MD   1,000 mg at 02/11/24 0503   bisacodyl  (DULCOLAX) suppository 10 mg  10 mg Rectal Daily PRN Tobie Priest, MD       docusate sodium  (COLACE) capsule 100 mg  100 mg Oral BID Tobie Priest, MD   100 mg at 02/11/24 0827   enoxaparin  (LOVENOX ) injection 40 mg  40 mg Subcutaneous Q24H Tobie Priest, MD   40 mg at 02/11/24 0826   Fe Fum-Vit C-Vit B12-FA (TRIGELS-F FORTE) capsule 1 capsule  1 capsule Oral QPC breakfast Amin, Sumayya, MD   1 capsule at 02/11/24 0827   HYDROmorphone  (DILAUDID ) injection 0.2-0.4 mg  0.2-0.4 mg Intravenous Q4H PRN Tobie,  Earnestine, MD       insulin  aspart (novoLOG ) injection 0-5 Units  0-5 Units Subcutaneous QHS Tobie Earnestine, MD   2 Units at 02/09/24 2251   insulin  aspart (novoLOG ) injection 0-9 Units  0-9 Units Subcutaneous TID WC Tobie Earnestine, MD   1 Units at 02/10/24 1158   methocarbamol  (ROBAXIN ) tablet 500 mg  500 mg Oral Q6H PRN Tobie Earnestine, MD       Or   methocarbamol  (ROBAXIN ) injection 500 mg  500 mg Intravenous Q6H PRN Tobie Earnestine, MD       metoCLOPramide  (REGLAN ) tablet 5-10 mg  5-10 mg Oral Q8H PRN Tobie Earnestine, MD       Or   metoCLOPramide  (REGLAN )  injection 5-10 mg  5-10 mg Intravenous Q8H PRN Tobie Earnestine, MD       metoprolol  tartrate (LOPRESSOR ) tablet 50 mg  50 mg Oral BID Jens Durand, MD   50 mg at 02/11/24 0827   ondansetron  (ZOFRAN ) tablet 4 mg  4 mg Oral Q6H PRN Tobie Earnestine, MD       Or   ondansetron  (ZOFRAN ) injection 4 mg  4 mg Intravenous Q6H PRN Tobie Earnestine, MD       oxyCODONE  (Oxy IR/ROXICODONE ) immediate release tablet 2.5-5 mg  2.5-5 mg Oral Q4H PRN Tobie Earnestine, MD       oxyCODONE  (Oxy IR/ROXICODONE ) immediate release tablet 5-10 mg  5-10 mg Oral Q4H PRN Tobie Earnestine, MD       senna-docusate (Senokot-S) tablet 1 tablet  1 tablet Oral QHS PRN Tobie Earnestine, MD       sodium phosphate  (FLEET) enema 1 enema  1 enema Rectal Once PRN Tobie Earnestine, MD       traMADol  (ULTRAM ) tablet 50 mg  50 mg Oral Q6H PRN Tobie Earnestine, MD       traZODone  (DESYREL ) tablet 50 mg  50 mg Oral QHS Tobie Earnestine, MD   50 mg at 02/10/24 2159     Discharge Medications: Please see discharge summary for a list of discharge medications.  Relevant Imaging Results:  Relevant Lab Results:   Additional Information SSN: [id number]  Kirke Breach  [place], LCSW

## 2024-02-11 NOTE — Progress Notes (Signed)
 Physical Therapy Treatment Patient Details Name: Herbert Phillips MRN: [id number] DOB: 1925/12/25 Today's Date: 02/11/2024   History of Present Illness Pt admitted to [facility] on 02/08/24 for mechanical fall that resulted in R intertrochanteric hip fracture. S/p IM nailing on 8/19 by Dr. Tobie. Significant PMH includes: T2DM, bladder cancer, HTN, sleep apnea, left hip fracture s/p IM nailing (2017), hx CVA, pAfib, transurethral resection of bladder tumor in 2016, on hospice care.    PT Comments  Pt tolerated treatment fair today, limited with further progression of mobility secondary to fatigue. Treatment continues to focus on functional strength training and OOB mobility. Today he is requiring increased assist for mobility: he is modA +2 for bed mobility, modA +2 for STS transfers, and maxA+2 to take 3 lateral steps towards chair. Unable to continue ambulation secondary to fatigue, therefore, totalA+2 provided for SPT from EOB>recliner. Performs 5 reps of BLE therex prior to initiation of mobility to assist with stiffness.  Pt continues to require increased multimodal cues for safety, sequencing, and hand placement. While he is requiring increased assist, he is able to demonstrate improvements in RLE facilitation compared to initial eval. Pt continues to be limited with further progress towards goals secondary to: decreased gross balance, decreased activity tolerance, generalized weakness, and increased pain levels. Pt will continue to benefit from skilled acute PT services to address deficits for return to baseline function. Will continue per POC.  Encourage OOB mobility with nursing and mobility tech for meals and toileting via Camie Ip (+2), for continued progress towards goals and maintenance of IND with functional mobility while hospitalized.      If plan is discharge home, recommend the following: Two people to help with walking and/or transfers;A lot of help with  bathing/dressing/bathroom;Assistance with cooking/housework;Assist for transportation;Help with stairs or ramp for entrance   Can travel by private vehicle     No  Equipment Recommendations   (defer to post acute)       Precautions / Restrictions Precautions Precautions: Fall Recall of Precautions/Restrictions: Impaired Restrictions Weight Bearing Restrictions Per Provider Order: Yes RLE Weight Bearing Per Provider Order: Weight bearing as tolerated     Mobility  Bed Mobility   Bed Mobility: Supine to Sit     Supine to sit: Mod assist, +2 for physical assistance     General bed mobility comments: for trunk and BLE facilitation to sit EOB, increased cueing for safety, sequencing, and hand placement; HOB elevated    Transfers     Transfers: Sit to/from Stand, Bed to chair/wheelchair/BSC Sit to Stand: +2 physical assistance, Mod assist Stand pivot transfers: Total assist         General transfer comment: modA +2 for power to stand from EOB multiple times with RW, shoes on; multimodal cues for safety, sequencing, hand placement, LE positioning, pushing down into RW as he tends to push forward, and hip extension. total assist +2 for SPT from EOB>Recliner.    Ambulation/Gait Ambulation/Gait assistance: Max assist Gait Distance (Feet):  (3 steps)           General Gait Details: max assist for balance, weight shift for LE facilitation, and RW management to take a few lateral steps to the R. demonstrates decreased foot clearance bil (R>L), kyphotic posture/downward gaze, and heavy posterior bias.     Balance Overall balance assessment: Needs assistance Sitting-balance support: Bilateral upper extremity supported, Feet supported Sitting balance-Leahy Scale: Fair Sitting balance - Comments: able to achieve fair seated balance with BUE support on knees,  significant kyphosis; posterior LOB without UE support requiring physical assist. able to correct balance with  multimodal cues Postural control: Posterior lean   Standing balance-Leahy Scale: Zero Standing balance comment: maxA +2 - totalA +2                            Communication Communication Communication: No apparent difficulties  Cognition Arousal: Alert Behavior During Therapy: WFL for tasks assessed/performed                             Following commands: Intact      Cueing Cueing Techniques: Verbal cues, Tactile cues, Visual cues  Exercises Total Joint Exercises Ankle Circles/Pumps: AAROM, 5 reps, Both Heel Slides: AAROM, Both, 5 reps Hip ABduction/ADduction: AAROM, Both, 5 reps Other Exercises Other Exercises: Participates in bed mobility, transfers, and minimal gait. Educated re: PT role/POC, WB status, safety with functional mobility in RW, pain mgmt techniques (pursed lip breathing).    General Comments General comments (skin integrity, edema, etc.): incision healing appropriately without strikethrough to bandage; multiple bandaids to BLE with healing scabs; distal BLE very cold this date which family reports is baseline      Pertinent Vitals/Pain Pain Assessment Faces Pain Scale: Hurts a little bit Pain Location: R hip with movement, no pain at rest Pain Descriptors / Indicators: Grimacing Pain Intervention(s): Monitored during session, Repositioned     PT Goals (current goals can now be found in the care plan section) Acute Rehab PT Goals Patient Stated Goal: get better PT Goal Formulation: With patient/family Time For Goal Achievement: 02/24/24 Potential to Achieve Goals: Good Progress towards PT goals: Progressing toward goals    Frequency    Min 2X/week           Co-evaluation PT/OT/SLP Co-Evaluation/Treatment: Yes Reason for Co-Treatment: For patient/therapist safety;To address functional/ADL transfers PT goals addressed during session: Mobility/safety with mobility OT goals addressed during session: ADL's and self-care       AM-PAC PT 6 Clicks Mobility   Outcome Measure  Help needed turning from your back to your side while in a flat bed without using bedrails?: A Lot Help needed moving from lying on your back to sitting on the side of a flat bed without using bedrails?: A Lot Help needed moving to and from a bed to a chair (including a wheelchair)?: A Lot Help needed standing up from a chair using your arms (e.g., wheelchair or bedside chair)?: A Lot Help needed to walk in hospital room?: A Lot Help needed climbing 3-5 steps with a railing? : Total 6 Click Score: 11    End of Session Equipment Utilized During Treatment: Gait belt Activity Tolerance: Patient tolerated treatment well Patient left: in chair;with call bell/phone within reach;with chair alarm set;with family/visitor present Nurse Communication: Mobility status PT Visit Diagnosis: Unsteadiness on feet (R26.81);Muscle weakness (generalized) (M62.81);History of falling (Z91.81);Pain;Difficulty in walking, not elsewhere classified (R26.2) Pain - Right/Left: Right Pain - part of body: Hip     Time: 9041-8977 PT Time Calculation (min) (ACUTE ONLY): 24 min  Charges:    $Therapeutic Activity: 8-22 mins PT General Charges $$ ACUTE PT VISIT: 1 Visit                      Camie CHARLENA Kluver, PT, DPT 11:56 AM,02/11/24 Physical Therapist - [facility]

## 2024-02-11 NOTE — Care Management Important Message (Signed)
 Important Message  Patient Details  Name: Herbert Phillips MRN: [id number] Date of Birth: Mar 11, 1926   Important Message Given:  Yes - Medicare IM     Diella Gillingham W, CMA 02/11/2024, 10:56 AM

## 2024-02-22 DEATH — deceased
